# Patient Record
Sex: Male | Born: 1950 | Race: White | Hispanic: No | Marital: Married | Smoking: Former smoker
Health system: Southern US, Community
[De-identification: ages and names within clinical notes are randomized; demographics above are authoritative.]

## PROBLEM LIST (undated history)

## (undated) DIAGNOSIS — I482 Chronic atrial fibrillation, unspecified: Secondary | ICD-10-CM

## (undated) DIAGNOSIS — N400 Enlarged prostate without lower urinary tract symptoms: Secondary | ICD-10-CM

## (undated) DIAGNOSIS — G4730 Sleep apnea, unspecified: Secondary | ICD-10-CM

## (undated) DIAGNOSIS — I4891 Unspecified atrial fibrillation: Secondary | ICD-10-CM

## (undated) DIAGNOSIS — K649 Unspecified hemorrhoids: Secondary | ICD-10-CM

## (undated) DIAGNOSIS — M1990 Unspecified osteoarthritis, unspecified site: Secondary | ICD-10-CM

## (undated) DIAGNOSIS — I499 Cardiac arrhythmia, unspecified: Secondary | ICD-10-CM

## (undated) DIAGNOSIS — K8020 Calculus of gallbladder without cholecystitis without obstruction: Secondary | ICD-10-CM

## (undated) DIAGNOSIS — K635 Polyp of colon: Secondary | ICD-10-CM

## (undated) DIAGNOSIS — D126 Benign neoplasm of colon, unspecified: Secondary | ICD-10-CM

## (undated) DIAGNOSIS — I10 Essential (primary) hypertension: Secondary | ICD-10-CM

## (undated) DIAGNOSIS — Q249 Congenital malformation of heart, unspecified: Secondary | ICD-10-CM

## (undated) DIAGNOSIS — R945 Abnormal results of liver function studies: Secondary | ICD-10-CM

## (undated) DIAGNOSIS — R7989 Other specified abnormal findings of blood chemistry: Secondary | ICD-10-CM

## (undated) DIAGNOSIS — K219 Gastro-esophageal reflux disease without esophagitis: Secondary | ICD-10-CM

## (undated) DIAGNOSIS — K5790 Diverticulosis of intestine, part unspecified, without perforation or abscess without bleeding: Secondary | ICD-10-CM

## (undated) DIAGNOSIS — E785 Hyperlipidemia, unspecified: Secondary | ICD-10-CM

## (undated) HISTORY — DX: Calculus of gallbladder without cholecystitis without obstruction: K80.20

## (undated) HISTORY — DX: Diverticulosis of intestine, part unspecified, without perforation or abscess without bleeding: K57.90

## (undated) HISTORY — DX: Unspecified atrial fibrillation: I48.91

## (undated) HISTORY — DX: Polyp of colon: K63.5

## (undated) HISTORY — DX: Benign neoplasm of colon, unspecified: D12.6

## (undated) HISTORY — PX: TONSILLECTOMY: SUR1361

## (undated) HISTORY — DX: Benign prostatic hyperplasia without lower urinary tract symptoms: N40.0

## (undated) HISTORY — DX: Hyperlipidemia, unspecified: E78.5

## (undated) HISTORY — DX: Unspecified hemorrhoids: K64.9

## (undated) HISTORY — DX: Congenital malformation of heart, unspecified: Q24.9

## (undated) HISTORY — DX: Essential (primary) hypertension: I10

## (undated) HISTORY — DX: Chronic atrial fibrillation, unspecified: I48.20

## (undated) HISTORY — DX: Other specified abnormal findings of blood chemistry: R79.89

## (undated) HISTORY — DX: Morbid (severe) obesity due to excess calories: E66.01

## (undated) HISTORY — DX: Abnormal results of liver function studies: R94.5

---

## 2002-10-14 ENCOUNTER — Encounter: Payer: Self-pay | Admitting: Emergency Medicine

## 2002-10-14 ENCOUNTER — Inpatient Hospital Stay (HOSPITAL_COMMUNITY): Admission: EM | Admit: 2002-10-14 | Discharge: 2002-10-15 | Payer: Self-pay | Admitting: Emergency Medicine

## 2002-10-14 ENCOUNTER — Encounter: Payer: Self-pay | Admitting: Internal Medicine

## 2002-12-24 ENCOUNTER — Ambulatory Visit (HOSPITAL_BASED_OUTPATIENT_CLINIC_OR_DEPARTMENT_OTHER): Admission: RE | Admit: 2002-12-24 | Discharge: 2002-12-24 | Payer: Self-pay | Admitting: Otolaryngology

## 2003-10-10 ENCOUNTER — Encounter: Payer: Self-pay | Admitting: Internal Medicine

## 2005-01-17 ENCOUNTER — Encounter: Admission: RE | Admit: 2005-01-17 | Discharge: 2005-01-17 | Payer: Self-pay | Admitting: Otolaryngology

## 2006-08-08 ENCOUNTER — Encounter: Payer: Self-pay | Admitting: Cardiology

## 2006-08-08 HISTORY — PX: US ECHOCARDIOGRAPHY: HXRAD669

## 2007-02-27 ENCOUNTER — Ambulatory Visit: Payer: Self-pay | Admitting: Internal Medicine

## 2007-04-12 ENCOUNTER — Ambulatory Visit: Payer: Self-pay | Admitting: Internal Medicine

## 2007-04-12 ENCOUNTER — Encounter: Payer: Self-pay | Admitting: Internal Medicine

## 2008-02-02 ENCOUNTER — Encounter: Admission: RE | Admit: 2008-02-02 | Discharge: 2008-02-02 | Payer: Self-pay | Admitting: Internal Medicine

## 2008-02-02 ENCOUNTER — Encounter (INDEPENDENT_AMBULATORY_CARE_PROVIDER_SITE_OTHER): Payer: Self-pay | Admitting: *Deleted

## 2008-03-07 ENCOUNTER — Encounter: Payer: Self-pay | Admitting: Cardiology

## 2010-05-04 ENCOUNTER — Encounter: Payer: Self-pay | Admitting: Internal Medicine

## 2010-08-10 ENCOUNTER — Encounter (INDEPENDENT_AMBULATORY_CARE_PROVIDER_SITE_OTHER): Payer: Self-pay | Admitting: *Deleted

## 2010-09-22 ENCOUNTER — Encounter: Payer: Self-pay | Admitting: Internal Medicine

## 2010-09-28 ENCOUNTER — Ambulatory Visit: Payer: Self-pay | Admitting: Internal Medicine

## 2010-09-28 DIAGNOSIS — Z860100 Personal history of colon polyps, unspecified: Secondary | ICD-10-CM | POA: Insufficient documentation

## 2010-09-28 DIAGNOSIS — I482 Chronic atrial fibrillation, unspecified: Secondary | ICD-10-CM | POA: Insufficient documentation

## 2010-09-28 DIAGNOSIS — D689 Coagulation defect, unspecified: Secondary | ICD-10-CM

## 2010-10-08 ENCOUNTER — Encounter: Payer: Self-pay | Admitting: Internal Medicine

## 2010-10-24 ENCOUNTER — Encounter: Payer: Self-pay | Admitting: Internal Medicine

## 2010-11-02 NOTE — Letter (Signed)
Summary: New Patient letter  [facility]  [address] [place], [place] [address]   Phone: [phone]  Fax: [phone]       08/10/2010 MRN: [id number]  Dan [name] [address] [place], [place]  [address]  Dear Mr. Holst,  Welcome to the Gastroenterology Division at [facility].    You are scheduled to see Dr. Marina Goodell on 09/28/2010 at 11:00AM on the 3rd floor at [facility], [address]  We ask that you try to arrive at our office 15 minutes prior to your appointment time to allow for check-in.  We would like you to complete the enclosed self-administered evaluation form prior to your visit and bring it with you on the day of your appointment.  We will review it with you.  Also, please bring a complete list of all your medications or, if you prefer, bring the medication bottles and we will list them.  Please bring your insurance card so that we may make a copy of it.  If your insurance requires a referral to see a specialist, please bring your referral form from your primary care physician.  Co-payments are due at the time of your visit and may be paid by cash, check or credit card.     Your office visit will consist of a consult with your physician (includes a physical exam), any laboratory testing he/she may order, scheduling of any necessary diagnostic testing (e.g. x-ray, ultrasound, CT-scan), and scheduling of a procedure (e.g. Endoscopy, Colonoscopy) if required.  Please allow enough time on your schedule to allow for any/all of these possibilities.    If you cannot keep your appointment, please call [phone] to cancel or reschedule prior to your appointment date.  This allows [place] the opportunity to schedule an appointment for another patient in need of care.  If you do not cancel or reschedule by 5 p.m. the business day prior to your appointment date, you will be charged a $50.00 late cancellation/no-show fee.    Thank you for choosing  [facility] for your medical needs.  We appreciate the opportunity to care for you.  Please visit [place] at our website  to learn more about our practice.                     Sincerely,                                                             The Gastroenterology Division

## 2010-11-02 NOTE — Letter (Signed)
Summary: Colonoscopy Letter  [facility]  [place], [place] [address]   Phone: [phone]  Fax: [phone]      May 04, 2010 MRN: [id number]   Dan [name] [address] [place], [place]  [address]   Dear [name],   According to your medical record, it is time for you to schedule a Colonoscopy. The American Cancer Society recommends this procedure as a method to detect early colon cancer. Patients with a family history of colon cancer, or a personal history of colon polyps or inflammatory bowel disease are at increased risk.  This letter has been generated based on the recommendations made at the time of your procedure. If you feel that in your particular situation this may no longer apply, please contact our office.  Please call our office at [phone] to schedule this appointment or to update your records at your earliest convenience.  Thank you for cooperating with [place] to provide you with the very best care possible.   Sincerely,  [name], M.D.  [facility]astroenter[facility] Division [phone]

## 2010-11-04 NOTE — Procedures (Signed)
Summary: Colonoscopy   Colonoscopy  Procedure date:  10/10/2003  Findings:      Location:  [facility].  Results: Diverticulosis.       Pathology:  Adenomatous polyp.        Pathology:  Hyperplastic polyp.       Procedures Next Due Date:    Colonoscopy: 10/2008  Colonoscopy  Procedure date:  10/10/2003  Findings:      Location:  [facility].  Results: Diverticulosis.       Pathology:  tubular adenoma.       Procedures Next Due Date:    Colonoscopy: 10/2008 Patient Name: Dan [name], Dan [name] MRN:  Procedure Procedures: Colonoscopy CPT: 29562.    with polypectomy. CPT: X5071110.  Personnel: Endoscopist: [name], MD.  Referred By: Nanine Means Reynaldo Minium, MD.  Exam Location: Exam performed in Outpatient Clinic. Outpatient  Patient Consent: Procedure, Alternatives, Risks and Benefits discussed, consent obtained, from patient. Consent was obtained by the RN.  Indications  Increased Risk Screening: For family history of colorectal neoplasia, in  parent age at onset: 57.  History  Current Medications: Patient is not currently taking Coumadin.  Pre-Exam Physical: Performed Oct 10, 2003. Entire physical exam was normal. Abnormal PE findings include: obese.  Exam Exam: Extent of exam reached: Cecum, extent intended: Cecum.  The cecum was identified by appendiceal orifice and IC valve. Patient position: on left side. Colon retroflexion performed. Images taken. ASA Classification: II. Tolerance: excellent.  Monitoring: Pulse and BP monitoring, Oximetry used. Supplemental O2 given.  Colon Prep Used Golytely for colon prep. Prep results: excellent.  Sedation Meds: Patient assessed and found to be appropriate for moderate (conscious) sedation. Fentanyl 125 mcg. given IV. Versed 12 given IV.  Findings POLYP: Ascending Colon, Maximum size: 5 mm. sessile polyp. Procedure:  snare without cautery, removed, retrieved, Polyp sent to  pathology. ICD9: Colon Polyps: 211.3.  NORMAL EXAM: Cecum to Rectum. Comments: internal hemorrhoids present.  - DIVERTICULOSIS: Descending Colon to Sigmoid Colon. ICD9: Diverticulosis, Colon: 562.10.   Assessment Abnormal examination, see findings above.  Diagnoses: 562.10: Diverticulosis, Colon.  211.3: Colon Polyps.   Events  Unplanned Interventions: No intervention was required.  Unplanned Events: There were no complications. Plans Patient Education: Patient given standard instructions for: Polyps.  Disposition: After procedure patient sent to recovery. After recovery patient sent home.  Scheduling/Referral: Colonoscopy, to [name], MD, in 3 years if polyp adenomatous; otherwise 5 years.,    This report was created from the original endoscopy report, which was reviewed and signed by the above listed endoscopist.   cc:  Burnard Bunting, MD      The Patient

## 2010-11-04 NOTE — Letter (Signed)
Summary: Anticoagulation Modification Letter  [facility]  [address] [place], [place] [address]   Phone: [phone]  Fax: [phone]    September 28, 2010  Re:    Dan [name] DOB:    02-14-1951 MRN:    [id number]    Dear Dr. Swaziland,  We have scheduled the above patient for an endoscopic procedure. Our records show that  he/she is on anticoagulation therapy. Please advise as to how long the patient may come off their therapy of Pradaxa prior to the scheduled procedure(s) on 11/08/10.   Please fax back/or route the completed form to [place] at [phone]..  Thank you for your help with this matter.  Sincerely,  Christie Nottingham CMA Duncan Dull)   Physician Recommendation:  Hold Plavix 7 days prior ________________  Hold Coumadin 5 days prior ____________  Hold Pradaxa 5 days prior ______________

## 2010-11-04 NOTE — Letter (Signed)
Summary: Anticoag  Anticoag   Imported By: Bubba Hales 10/18/2010 10:18:06  _____________________________________________________________________  External Attachment:    Type:   Image     Comment:   External Document

## 2010-11-04 NOTE — Letter (Signed)
Summary: [facility] Instructions  [facility]  [address] [place], [place] [address]   Phone: [phone]  Fax: [phone]       Dan [name]    04/16/1951    MRN: [id number]        Procedure Day /Date: Monday February 6th, 2012     Arrival Time: 7:30am     Procedure Time: 8:30am     Location of Procedure:                    _x _  [facility] (4th Floor)                        PREPARATION FOR COLONOSCOPY WITH MOVIPREP   Starting 5 days prior to your procedure 21/12 do not eat nuts, seeds, popcorn, corn, beans, peas,  salads, or any raw vegetables.  Do not take any fiber supplements (e.g. Metamucil, Citrucel, and Benefiber).  THE DAY BEFORE YOUR PROCEDURE         DATE: 11/07/10  DAY: Sunday  1.  Drink clear liquids the entire day-NO SOLID FOOD  2.  Do not drink anything colored red or purple.  Avoid juices with pulp.  No orange juice.  3.  Drink at least 64 oz. (8 glasses) of fluid/clear liquids during the day to prevent dehydration and help the prep work efficiently.  CLEAR LIQUIDS INCLUDE: Water Jello Ice Popsicles Tea (sugar ok, no milk/cream) Powdered fruit flavored drinks Coffee (sugar ok, no milk/cream) Gatorade Juice: apple, white grape, white cranberry  Lemonade Clear bullion, consomm, broth Carbonated beverages (any kind) Strained chicken noodle soup Hard Candy                             4.  In the morning, mix first dose of MoviPrep solution:    Empty 1 Pouch A and 1 Pouch B into the disposable container    Add lukewarm drinking water to the top line of the container. Mix to dissolve    Refrigerate (mixed solution should be used within 24 hrs)  5.  Begin drinking the prep at 5:00 p.m. The MoviPrep container is divided by 4 marks.   Every 15 minutes drink the solution down to the next mark (approximately 8 oz) until the full liter is complete.   6.  Follow completed prep with 16 oz of clear liquid of your choice  (Nothing red or purple).  Continue to drink clear liquids until bedtime.  7.  Before going to bed, mix second dose of MoviPrep solution:    Empty 1 Pouch A and 1 Pouch B into the disposable container    Add lukewarm drinking water to the top line of the container. Mix to dissolve    Refrigerate  THE DAY OF YOUR PROCEDURE      DATE: 11/08/10 DAY Monday  Beginning at 3:30 a.m. (5 hours before procedure):         1. Every 15 minutes, drink the solution down to the next mark (approx 8 oz) until the full liter is complete.  2. Follow completed prep with 16 oz. of clear liquid of your choice.    3. You may drink clear liquids until 6:30am (2 HOURS BEFORE PROCEDURE).   MEDICATION INSTRUCTIONS  Unless otherwise instructed, you should take regular prescription medications with a small sip of water   as early as possible the morning of your  procedure.   Additional medication instructions:  You will stop taking Pradaxa 5 days prior to your procedure after we  get approval from Dr. Peter Swaziland. We will contact you when we get clearance from him.         OTHER INSTRUCTIONS  You will need a responsible adult at least 62 years of age to accompany you and drive you home.   This person must remain in the waiting room during your procedure.  Wear loose fitting clothing that is easily removed.  Leave jewelry and other valuables at home.  However, you may wish to bring a book to read or  an iPod/MP3 player to listen to music as you wait for your procedure to start.  Remove all body piercing jewelry and leave at home.  Total time from sign-in until discharge is approximately 2-3 hours.  You should go home directly after your procedure and rest.  You can resume normal activities the  day after your procedure.  The day of your procedure you should not:   Drive   Make legal decisions   Operate machinery   Drink alcohol   Return to work  You will receive specific instructions  about eating, activities and medications before you leave.    The above instructions have been reviewed and explained to me by   _______________________    I fully understand and can verbalize these instructions _____________________________ Date _________

## 2010-11-04 NOTE — Miscellaneous (Signed)
Summary: medication udate  Clinical Lists Changes  Medications: Added new medication of COUMADIN 5 MG TABS (WARFARIN SODIUM) take as directed Added new medication of HYDROCHLOROTHIAZIDE 25 MG TABS (HYDROCHLOROTHIAZIDE) 1 by mouth once daily Added new medication of NORVASC 10 MG TABS (AMLODIPINE BESYLATE) 1 by mouth once daily Added new medication of BENICAR 20 MG TABS (OLMESARTAN MEDOXOMIL) 1 by mouth once daily Added new medication of LISINOPRIL 40 MG TABS (LISINOPRIL) 1 by mouth once daily Added new medication of METOPROLOL TARTRATE 100 MG TABS (METOPROLOL TARTRATE) 1 by mouth once daily Added new medication of VYTORIN 10-20 MG TABS (EZETIMIBE-SIMVASTATIN) 1 by mouth once daily

## 2010-11-04 NOTE — Assessment & Plan Note (Signed)
Summary: Surveillance colonoscopy on Pradaxa    History of Present Illness Visit Type: Initial Visit Primary GI MD: Scarlette Shorts MD Primary Marissa Weaver: Burnard Bunting, MD Chief Complaint: colon screening, patient taking Pradaxa History of Present Illness:   60 year old white male with a history of hypertension, hyperlipidemia, obesity, and atrial fibrillation for which he is on Pradaxa therapy. She also has a history of multiple adenomatous colon polyps with his initial colonoscopy in 2005 followup colonoscopy in 2008. Also a family history of colon cancer in his mother. Since his last colonoscopy, he has been medically stable. He has switched from Coumadin to Pradaxa. He has come off Coumadin, with the approval of his cardiologist, in the past. Aside from hemorrhoid related itching, no GI complaints. He continues to see Dr. Reynaldo Minium for his general medical care and Dr. Martinique for his cardiac care.   GI Review of Systems      Denies abdominal pain, acid reflux, belching, bloating, chest pain, dysphagia with liquids, dysphagia with solids, heartburn, loss of appetite, nausea, vomiting, vomiting blood, weight loss, and  weight gain.      Reports hemorrhoids.     Denies anal fissure, black tarry stools, change in bowel habit, constipation, diarrhea, diverticulosis, fecal incontinence, heme positive stool, irritable bowel syndrome, jaundice, light color stool, liver problems, rectal bleeding, and  rectal pain. Preventive Screening-Counseling & Management  Alcohol-Tobacco     Smoking Status: quit      Drug Use:  no.      Current Medications (verified): 1)  Pradaxa 150 Mg Caps (Dabigatran Etexilate Mesylate) .... Two Times A Day 2)  Hydrochlorothiazide 25 Mg Tabs (Hydrochlorothiazide) .Marland Kitchen.. 1 By Mouth Once Daily 3)  Norvasc 10 Mg Tabs (Amlodipine Besylate) .Marland Kitchen.. 1 By Mouth Once Daily 4)  Triamterene-Hctz 37.5-25 Mg Tabs (Triamterene-Hctz) .... Once Daily 5)  Lisinopril 40 Mg Tabs (Lisinopril)  .Marland Kitchen.. 1 By Mouth Once Daily 6)  Metoprolol Tartrate 100 Mg Tabs (Metoprolol Tartrate) .Marland Kitchen.. 1 By Mouth Once Daily 7)  Vytorin 10-20 Mg Tabs (Ezetimibe-Simvastatin) .Marland Kitchen.. 1 By Mouth Once Daily 8)  Bystolic 20 Mg Tabs (Nebivolol Hcl) .... Take 2 Tablets 49m Once Daily  Allergies (verified): No Known Drug Allergies  Past History:  Past Medical History: Atrial Fibrillation Adenomatous Colon Polyps Diverticulosis Hypertension Sleep Apnea  Past Surgical History: Reviewed history from 03/19/2008 and no changes required. Unremarkable  Family History: Family History of Colon Cancer:Mother Family History of Colon Polyps:Mother  Social History: Occupation: [employer] Patient is a former smoker.  Alcohol Use - yes Daily Caffeine Use Illicit Drug Use - no Smoking Status:  quit Drug Use:  no  Review of Systems  The patient denies allergy/sinus, anemia, anxiety-new, arthritis/joint pain, back pain, blood in urine, breast changes/lumps, change in vision, confusion, cough, coughing up blood, depression-new, fainting, fatigue, fever, headaches-new, hearing problems, heart murmur, heart rhythm changes, itching, menstrual pain, muscle pains/cramps, night sweats, nosebleeds, pregnancy symptoms, shortness of breath, skin rash, sleeping problems, sore throat, swelling of feet/legs, swollen lymph glands, thirst - excessive , urination - excessive , urination changes/pain, urine leakage, vision changes, and voice change.    Vital Signs:  Patient profile:   60year old male Height:      73 inches Weight:      282.25 pounds BMI:     37.37 Pulse rate:   100 / minute Pulse rhythm:   regular BP sitting:   130 / 88  (left arm) Cuff size:   large  Vitals Entered By: June McMurray  CMA Deborra Medina) (September 28, 2010 10:57 AM)  Physical Exam  General:  Well developed, obese,well nourished, no acute distress. Head:  Normocephalic and atraumatic. Eyes:  PERRLA, no icterus. Nose:  No deformity, discharge,   or lesions. Mouth:  No deformity or lesions, dentition normal. Neck:  Supple; no masses or thyromegaly. Thick. Lungs:  Clear throughout to auscultation. Heart:  Regular rate and rhythm; no murmurs, rubs,  or bruits. Abdomen:  Soft, nontender and nondistended. No masses, hepatosplenomegaly or hernias noted. Normal bowel sounds. Rectal:  deferred until colonoscopy Msk:  Symmetrical with no gross deformities. Normal posture. Pulses:  Normal pulses noted. Extremities:  No clubbing, cyanosis, edema or deformities noted. Neurologic:  Alert and  oriented x4. Skin:  Intact without significant lesions or rashes. Psych:  Alert and cooperative. Normal mood and affect.   Impression & Recommendations:  Problem # 1:  PERSONAL HX COLONIC POLYPS (ICD-V12.72) history of adenomatous colon polyps. Due for surveillance colonoscopy. High-Risk due to multiple medical problems and the need to address anticoagulation therapy.  Plan: #1. Colonoscopy. The nature of the procedure as well as the risks, and if it's, and alternatives were reviewed. He understood and agreed to proceed #2. Movi prep prescribed. The patient instructed on its use #3. Hold Pradaxa 5 days prior to colonoscopy if okay with Dr. Martinique. We will send him a letter for his review regarding this issue  Problem # 2:  FAMILY HX COLON CANCER (ICD-V16.0) parent  Problem # 3:  ATRIAL FIBRILLATION (ICD-427.31) Assessment: Comment Only  Problem # 4:  OTHER AND UNSPECIFIED COAGULATION DEFECTS (ICD-286.9) needs to be addressed preprocedure as discussed in #1 above  Other Orders: Colonoscopy (Colon)  Patient Instructions: 1)  Pick up your prep from your pharmacy.  2)  We will obtain clearance from Dr. Martinique regarding your Pradaxa clearance and we will contact you. 3)  Colonoscopy and Flexible Sigmoidoscopy brochure given.  4)  Copy sent to : Peter Martinique, MD 5)                         Burnard Bunting, MD 6)  The medication list was reviewed  and reconciled.  All changed / newly prescribed medications were explained.  A complete medication list was provided to the patient / caregiver. Prescriptions: MOVIPREP 100 GM  SOLR (PEG-KCL-NACL-NASULF-NA ASC-C) As per prep instructions.  #1 x 0   Entered by:   Marlon Pel CMA ([place])   Authorized by:   Irene Shipper MD   Signed by:   Marlon Pel CMA ([place]) on 09/28/2010   Method used:   Electronically to        [facility] [phone]* (retail)       [address]       [place], [place]  [address]       Ph: [phone]       Fax: [phone]   RxID:   [id number]

## 2010-11-18 ENCOUNTER — Other Ambulatory Visit (AMBULATORY_SURGERY_CENTER): Payer: BC Managed Care – PPO | Admitting: Internal Medicine

## 2010-11-18 ENCOUNTER — Other Ambulatory Visit: Payer: Self-pay | Admitting: Internal Medicine

## 2010-11-18 DIAGNOSIS — Z1211 Encounter for screening for malignant neoplasm of colon: Secondary | ICD-10-CM

## 2010-11-18 DIAGNOSIS — Z86010 Personal history of colonic polyps: Secondary | ICD-10-CM

## 2010-11-18 DIAGNOSIS — K5730 Diverticulosis of large intestine without perforation or abscess without bleeding: Secondary | ICD-10-CM

## 2010-11-18 DIAGNOSIS — D126 Benign neoplasm of colon, unspecified: Secondary | ICD-10-CM

## 2010-11-23 ENCOUNTER — Encounter: Payer: Self-pay | Admitting: Internal Medicine

## 2010-11-24 NOTE — Procedures (Addendum)
Summary: Colonoscopy  Patient: Dan [name] Note: All result statuses are Final unless otherwise noted.  Tests: (1) Colonoscopy (COL)   COL Colonoscopy           [facility]     [place], [place]  [id number]           COLONOSCOPY PROCEDURE REPORT           PATIENT:  Dan [name], Dan [name]  MR#:  [id number]     BIRTHDATE:  [birth year], 66 yrs. old  GENDER:  male     ENDOSCOPIST:  Docia Chuck. Geri Seminole, MD     REF. BY:  Surveillance Program Recall,     PROCEDURE DATE:  11/18/2010     PROCEDURE:  Colonoscopy with snare polypectomy x 2     ASA CLASS:  Class II     INDICATIONS:  history of pre-cancerous (adenomatous) colon polyps,     surveillance and high-risk screening, family history of colon     cancer ; multiple adenomas 2005 and 2008; mom w/ hx CRC     MEDICATIONS:   Fentanyl 50 mcg IV, Versed 5 mg IV           DESCRIPTION OF PROCEDURE:   After the risks benefits and     alternatives of the procedure were thoroughly explained, informed     consent was obtained.  Digital rectal exam was performed and     revealed no abnormalities.   The LB CF-[id number] endoscope     was introduced through the anus and advanced to the cecum, which     was identified by both the appendix and ileocecal valve, without     limitations.Time to cecum = 2:18 min.  The quality of the prep was     excellent, using MoviPrep.  The instrument was then slowly     withdrawn (time = 17:04 min) as the colon was fully examined.     <<PROCEDUREIMAGES>>           FINDINGS:  Two 105m polyps were found in the cecum and transverse     colon respectively. Polyps were snared without cautery. Retrieval     was successful.  Moderate diverticulosis was found in the left     colon.  Otherwise normal colonoscopy without other polyps, masses,     vascular ectasias, or inflammatory changes.   Retroflexed views in     the rectum revealed no abnormalities.    The scope was then     withdrawn  from the patient and the procedure completed.           COMPLICATIONS:  None           ENDOSCOPIC IMPRESSION:     1) Two polyps - removed     2) Moderate diverticulosis in the left colon     3) Otherwise nl colonoscopy     RECOMMENDATIONS:     1) Follow up colonoscopy in 5 years     ______________________________     JDocia Chuck PGeri Seminole MD           CC:  RBurnard Bunting MD; The Patient           n.     eSIGNED:   JDocia Chuck PGeri Seminoleat 11/18/2010 09:05 AM           SScheryl Darter [id number] Note: An exclamation mark (!) indicates a result that was  not dispersed into the flowsheet. Document Creation Date: 11/18/2010 9:06 AM _______________________________________________________________________  (1) Order result status: Final Collection or observation date-time: 11/18/2010 08:59 Requested date-time:  Receipt date-time:  Reported date-time:  Referring Physician:   Ordering Physician: Lavena Bullion [phone]) Specimen Source:  Source: Tawanna Cooler Order Number: [id number] Lab site:   Appended Document: Colonoscopy recall 5 yrs     Procedures Next Due Date:    Colonoscopy: 11/2015  Appended Document: Colonoscopy     Procedures Next Due Date:    Colonoscopy: 12/2015

## 2010-11-30 NOTE — Letter (Addendum)
Summary: Patient Notice- Polyp Results  Dan [name]  [address] [place], [place] [address]   Phone: [phone]  Fax: [phone]        November 23, 2010 MRN: [id number]    Dan [name] [address] [place], [place]  [address]    Dear [name],  I am pleased to inform you that the colon polyp(s) removed during your recent colonoscopy was (were) found to be benign (no cancer detected) upon pathologic examination.  I recommend you have a repeat colonoscopy examination in 5 years to look for recurrent polyps, as having colon polyps increases your risk for having recurrent polyps or even colon cancer in the future.  Should you develop new or worsening symptoms of abdominal pain, bowel habit changes or bleeding from the rectum or bowels, please schedule an evaluation with either your primary care physician or with me.    Additional information/recommendations:  __ No further action with [name] is needed at this time. Please      follow-up with your primary care physician for your other healthcare      needs.  Please call us if you are having persistent problems or have questions about your condition that have not been fully answered at this time.  Sincerely,  Irene Shipper MD  This letter has been electronically signed by your physician.  Appended Document: Patient Notice- Polyp Results letter mailed

## 2011-02-12 ENCOUNTER — Other Ambulatory Visit: Payer: Self-pay | Admitting: Cardiology

## 2011-02-14 NOTE — Telephone Encounter (Signed)
escribe medication per fax request  

## 2011-02-15 NOTE — Assessment & Plan Note (Signed)
Dan [name] OFFICE NOTE   Dan [name]                   MRN:          [id number]  DATE:02/27/2007                            DOB:          [birth year]    REASON FOR EVALUATION:  Surveillance colonoscopy.   HISTORY:  This is a 60 year old white male with a history of  hypertension and recently diagnosed atrial fibrillation, for which he is  on Coumadin.  Patient also has a history of adenomatous colon polyp and  a family history of colon cancer in his mother.  His index colonoscopy  was performed in January, 2005.  He was found to have a sessile proximal  colon polyp, which was removed and found to be an adenoma.  Also, left-  sided diverticulosis.  Followup in three years recommended.  Since that  time, he has been doing well.  About three months ago, he was  incidentally found to have atrial fibrillation.  He was placed on  Coumadin therapy as well as metoprolol.  Patient denies abdominal pain,  change in bowel habits, or rectal bleeding.  No other significant  interval medical problems.   PAST MEDICAL HISTORY:  As above.   PAST SURGICAL HISTORY:  None.   ALLERGIES:  No known drug allergies.   CURRENT MEDICATIONS:  1. Vytorin 10/70 mg daily.  2. Metoprolol 100 mg daily.  3. Lisinopril 40 mg daily.  4. Benicar 20 mg daily.  5. Norvasc 10 mg daily.  6. Hydrochlorothiazide 25 mg daily.  7. Coumadin 5 mg daily, Sunday through Friday.   FAMILY HISTORY:  Mother with colon cancer.   SOCIAL HISTORY:  Patient is married with two children.  Works as a Teacher, music at [employer].  He does not smoke.  He  does use alcohol.   REVIEW OF SYSTEMS:  Per diagnostic evaluation form.   PHYSICAL EXAMINATION:  GENERAL:  A well-appearing male in no acute  distress.  He is alert and oriented.  VITAL SIGNS:  Blood pressure is 138/88, heart rate is 96 and irregularly  irregular.  HEENT:  Sclerae are anicteric.  Conjunctivae are pink.  LUNGS:  Clear.  HEART:  Irregularly irregular.  ABDOMEN:  Soft without tenderness, mass, or hernia.  EXTREMITIES:  Without edema.   IMPRESSION:  This is a 60 year old gentleman with a personal history of  adenomatous colon polyps and a family history of colon cancer in a first-  degree relative.  He is due for surveillance colonoscopy.  The nature of  the procedure, as well as the risks, benefits and alternatives have been  reviewed.  He understood and agreed to proceed.  We also discussed the  issues surrounding his Coumadin therapy.  Patient states to me that his  cardiologist, Dr. Martinique, said that it would be okay to come off  Coumadin for a short period of time for his colonoscopy.  If this is  acceptable, we would like to hold the drug for about three days prior to  the exam.  We will confer with Dr. Martinique on this issue to confirm  acceptabiltiy.  [name], MD  Electronically Signed    JNP/MedQ  DD: 02/27/2007  DT: 02/28/2007  Job #: [id number]   cc:   Burnard Bunting, M.D.  Peter M. Martinique, M.D.

## 2011-02-18 NOTE — H&P (Signed)
NAMEBRALLAN, DENIO NO.:  [id number]   MEDICAL RECORD NO.:  [id number]                   PATIENT TYPE:  EMS   LOCATION:  ED                                   FACILITY:  [facility]   PHYSICIAN:  Burnard Bunting, M.D.               DATE OF BIRTH:  January 08, 1951   DATE OF ADMISSION:  10/13/2002  DATE OF DISCHARGE:                                HISTORY & PHYSICAL   CHIEF COMPLAINT:  Diarrhea, lightheadedness.   HISTORY OF PRESENT ILLNESS:  The patient is a very pleasant 60 year old  gentleman with known essential hypertension and a recent bout of sinusitis,  presenting at this time with diarrhea, lightheadedness, and swelling about  the lips.  Much of the history is obtained from the wife as the patient is  pretty drowsy and falls asleep very easily following some alcohol with the  ball games and some Benadryl as a result of his reaction in the emergency  room.  Wife relates that he has had a pretty fairly normal weekend.  For the  last two weeks he has had some difficulty with sinus symptoms and dyspnea  but has been relatively normal with no fever and had normal days.  The  weekend was pretty unremarkable;  he has been watching ball games all day  and drinking a combination of scotch and wine.  Evidently, upon questioning  him some time preceding the event he, as well, had candy with macadamia nuts  and as a side note happened to mention that he is allergic to [place].  The wife had gone to bed with son and awoke to him calling for her  from the [facility], where she found him to have difficulty walking, lightheaded.  She assisted him to bed and summoned EMS.  He was brought to the emergency  room where he was found to be hypotensive with blood pressure approximately  63-80/40-50.  He was also noted to have developed some angioedema about the  face some time during the late evening hours, timing not entirely clear.  He  has had no nausea or vomiting.   Denies any fevers.  Has had no cough.  Denies any chest pain.  In the emergency room he was treated with steroids,  dopamine, and Benadryl and is admitted at this time for stabilization.   ALLERGIES:  No known drug allergies.  The only allergy is known to be  [place].   MEDICATIONS:  1. Norvasc 5 mg daily.  2. Lisinopril 40 mg daily.  3. HCTZ 25 daily.  4. Vioxx 25 daily p.r.n.  5. Augmentin XR 1000 daily.  6. Flonase.   PAST MEDICAL HISTORY:  Essential hypertension.   PAST SURGICAL HISTORY:  None.   FAMILY HISTORY:  Noncontributory.   SOCIAL HISTORY:  The patient is married, has one son.  Does not smoke.  Drinks socially.  This evening alcohol intake not entirely  clear but has had  a fair amount of scotch and wine, to the extent that alcohol was clearly  able to be smelled across the room.   PHYSICAL EXAMINATION:  VITAL SIGNS:  Temperature 95, blood pressure systolic  051 at this time, pulse 100 and regular, respiratory rate 18.  GENERAL:  Patient currently snoring.  SKIN:  No urticaria.  HEENT:  Anicteric.  Good facial symmetry.  Significant periorbital and  perioral angioedema.  Oropharynx benign.  Uvula midline, nonobstructing.  No  stridor.  NECK:  No JVD or bruits.  LUNGS:  Remarkable for some coarse upper airway sounds.  No wheezing, rales,  or rhonchi.  CARDIOVASCULAR:  Regular rate and rhythm.  No murmur, gallop, rub, or heave.  ABDOMEN:  Soft and nontender.  No hepatosplenomegaly.  EXTREMITIES:  No cyanosis, clubbing, or edema.  Pulses 2+, warm distally.  GENITOURINARY:  Not done.  RECTAL:  Not done.  NEUROLOGIC:  Difficult as the patient is quite drowsy, felt to be a  combination of alcohol and Benadryl.  He will arouse, follow simple  commands, nonlateralizing.   LABORATORY DATA:  Chest x-ray:  Poor inspiratory effort.   EKG:  Normal.   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  No frank evidence  of sepsis and certainly no  evidence of cardiac event at this time.   PLAN:  The patient is to be admitted for IV fluids, steroids, dopamine,  empiric coverage with antibiotic given his recent sinus infection and use of  Augmentin, and monitoring.  For details, see orders.                                               Burnard Bunting, M.D.    RA/MEDQ  D:  10/14/2002  T:  10/14/2002  Job:  [id number]

## 2011-03-20 ENCOUNTER — Other Ambulatory Visit: Payer: Self-pay | Admitting: Cardiology

## 2011-03-21 ENCOUNTER — Other Ambulatory Visit: Payer: Self-pay | Admitting: Cardiology

## 2011-03-21 NOTE — Telephone Encounter (Signed)
Med refill. Msg also sent with script that pt needs one year office visit

## 2011-06-18 ENCOUNTER — Other Ambulatory Visit: Payer: Self-pay | Admitting: Cardiology

## 2011-06-20 ENCOUNTER — Encounter: Payer: Self-pay | Admitting: Cardiology

## 2011-06-20 NOTE — Telephone Encounter (Signed)
Refilled Meds from fax  

## 2011-06-24 ENCOUNTER — Encounter: Payer: Self-pay | Admitting: Cardiology

## 2011-06-24 ENCOUNTER — Ambulatory Visit (INDEPENDENT_AMBULATORY_CARE_PROVIDER_SITE_OTHER): Payer: BC Managed Care – PPO | Admitting: Cardiology

## 2011-06-24 VITALS — BP 134/84 | HR 80 | Ht 73.0 in | Wt 287.0 lb

## 2011-06-24 DIAGNOSIS — I4891 Unspecified atrial fibrillation: Secondary | ICD-10-CM

## 2011-06-24 DIAGNOSIS — E785 Hyperlipidemia, unspecified: Secondary | ICD-10-CM | POA: Insufficient documentation

## 2011-06-24 DIAGNOSIS — E119 Type 2 diabetes mellitus without complications: Secondary | ICD-10-CM

## 2011-06-24 DIAGNOSIS — I10 Essential (primary) hypertension: Secondary | ICD-10-CM | POA: Insufficient documentation

## 2011-06-24 MED ORDER — DABIGATRAN ETEXILATE MESYLATE 150 MG PO CAPS: 150.0000 mg | ORAL_CAPSULE | Freq: Two times a day (BID) | ORAL | Status: DC

## 2011-06-24 NOTE — Progress Notes (Signed)
   Dan [name] Date of Birth: 1951-05-21   History of Present Illness Dan [name] is seen today for yearly followup. He has a history of chronic atrial fibrillation that has been managed with rate control and anticoagulation. He remains asymptomatic. He denies any chest pain, shortness of breath, or palpitations. He remains active. He is now on metformin for diabetes. He reports his blood pressure has been under excellent control.  Current Outpatient Prescriptions on File Prior to Visit  Medication Sig Dispense Refill  . amLODipine (NORVASC) 10 MG tablet Take 10 mg by mouth daily.        Marland Kitchen ezetimibe-simvastatin (VYTORIN) 10-20 MG per tablet Take 1 tablet by mouth at bedtime.        Marland Kitchen lisinopril (PRINIVIL,ZESTRIL) 40 MG tablet Take 40 mg by mouth daily.        . metFORMIN (GLUCOPHAGE) 1000 MG tablet Take 1 tablet by mouth Twice daily.      . Nebivolol HCl (BYSTOLIC) 20 MG TABS Take 40 mg by mouth daily.        Marland Kitchen triamterene-hydrochlorothiazide (DYAZIDE) 37.5-25 MG per capsule Take 1 capsule by mouth every morning.        Marland Kitchen DISCONTD: PRADAXA 150 MG CAPS TAKE 1 CAPSULE BY MOUTH TWICE DAILY  60 capsule  3  . olmesartan (BENICAR) 20 MG tablet Take 20 mg by mouth daily.          No Known Allergies  Past Medical History  Diagnosis Date  . Chronic atrial fibrillation   . Hypertension   . Hyperlipidemia   . Morbid obesity   . BPH (benign prostatic hypertrophy)     Past Surgical History  Procedure Date  . US echocardiography 08/08/2006    ef 55-60%    History  Smoking status  . Former Smoker  . Quit date: 06/20/1991  Smokeless tobacco  . Not on file    History  Alcohol Use No    Family History  Problem Relation Age of Onset  . Heart attack Father     Review of Systems: As per history of present illness.  All other systems were reviewed and are negative.  Physical Exam: BP 134/84  Pulse 80  Ht 6' 1"  (1.854 m)  Wt 287 lb (130.182 kg)  BMI 37.86 kg/m2 He is an obese  white male in no distress. The patient is alert and oriented x 3.  The mood and affect are normal.  The skin is warm and dry.  Color is normal.  The HEENT exam reveals that the sclera are nonicteric.  The mucous membranes are moist.  The carotids are 2+ without bruits.  There is no thyromegaly.  There is no JVD.  The lungs are clear.  The chest wall is non tender.  The heart exam reveals a regular rate with a normal S1 and S2.  There are no murmurs, gallops, or rubs.  The PMI is not displaced.   Abdominal exam reveals good bowel sounds.  There is no guarding or rebound.  There is no hepatosplenomegaly or tenderness.  There are no masses.  Exam of the legs reveal no clubbing, cyanosis, or edema.  The legs are without rashes.  The distal pulses are intact.  Cranial nerves II - XII are intact.  Motor and sensory functions are intact.  The gait is normal.  LABORATORY DATA: ECG today demonstrates atrial fibrillation with a controlled ventricular response. It is otherwise normal.  Assessment / Plan:

## 2011-06-24 NOTE — Assessment & Plan Note (Signed)
Recent lab work checked by Dr. Reynaldo Minium. Patient reports that levels were acceptable.

## 2011-06-24 NOTE — Patient Instructions (Signed)
Continue your current medicatons.  I will see you again in 6 months.

## 2011-06-24 NOTE — Assessment & Plan Note (Signed)
Blood pressure is under excellent control on his current medical therapy.

## 2011-06-24 NOTE — Assessment & Plan Note (Signed)
His rate is well controlled and he is anticoagulated with Pradaxa. He is asymptomatic. We will continue with his current medical therapy. I will followup again in one year.

## 2011-06-25 ENCOUNTER — Encounter: Payer: Self-pay | Admitting: Cardiology

## 2011-10-26 ENCOUNTER — Other Ambulatory Visit: Payer: Self-pay | Admitting: Cardiology

## 2012-06-29 ENCOUNTER — Other Ambulatory Visit: Payer: Self-pay | Admitting: Cardiology

## 2012-06-29 MED ORDER — DABIGATRAN ETEXILATE MESYLATE 150 MG PO CAPS: 150.0000 mg | ORAL_CAPSULE | Freq: Two times a day (BID) | ORAL | Status: DC

## 2012-12-05 ENCOUNTER — Other Ambulatory Visit: Payer: Self-pay | Admitting: Cardiology

## 2013-02-05 ENCOUNTER — Telehealth: Payer: Self-pay | Admitting: Physician Assistant

## 2013-02-05 ENCOUNTER — Other Ambulatory Visit: Payer: Self-pay | Admitting: *Deleted

## 2013-02-05 MED ORDER — DABIGATRAN ETEXILATE MESYLATE 150 MG PO CAPS: 150.0000 mg | ORAL_CAPSULE | Freq: Two times a day (BID) | ORAL | Status: DC

## 2013-02-05 NOTE — Telephone Encounter (Signed)
The pharmacist, Judson Roch, with [facility] on [address] called after-hours regarding a problem getting the patient's Pradaxa refilled. She stated they have sent our office refill requests for the last 5 days but have not heard anything back. I do not have access to refill request files while on call at the hospital. The patient has been compliant with his Pradaxa 134m BID with no reported bleeding. Most recent OV was 06/2011 for chronic atrial fibrillation. The rx that he typically receives is a blister pack and unable to be separated per pharmacist. Therefore, I have authorized a short-term rx of Pradaxa 151mBID disp #30 with zero refills. The pharmacist will instruct the patient to call our office in the AM to schedule a f/u appointment and discuss obtaining further refills on his medications - he is waiting at pharmacy now. Will forward to triage to look into as well. He needs an OV in the next week. Dayna Dunn PA-C

## 2013-02-06 ENCOUNTER — Other Ambulatory Visit: Payer: Self-pay

## 2013-02-06 MED ORDER — DABIGATRAN ETEXILATE MESYLATE 150 MG PO CAPS: 150.0000 mg | ORAL_CAPSULE | Freq: Two times a day (BID) | ORAL | Status: DC

## 2013-02-06 NOTE — Telephone Encounter (Signed)
Patient called no answer.Left message to call office need to schedule appointment with Dr.Jordan.Last office visit 06/24/11.Pradaxa refilled.

## 2013-02-06 NOTE — Telephone Encounter (Signed)
Patient called appointment scheduled with Dr.Jordan 04/12/13.

## 2013-02-06 NOTE — Telephone Encounter (Signed)
Will forward to Dr. Elvis Coil nurse.

## 2013-02-06 NOTE — Addendum Note (Signed)
Addended by: Golden Hurter D on: 02/06/2013 08:46 AM   Modules accepted: Orders

## 2013-04-12 ENCOUNTER — Encounter: Payer: Self-pay | Admitting: Cardiology

## 2013-04-12 ENCOUNTER — Ambulatory Visit (INDEPENDENT_AMBULATORY_CARE_PROVIDER_SITE_OTHER): Payer: BC Managed Care – PPO | Admitting: Cardiology

## 2013-04-12 VITALS — BP 140/84 | HR 83 | Ht 73.0 in | Wt 288.1 lb

## 2013-04-12 DIAGNOSIS — I4891 Unspecified atrial fibrillation: Secondary | ICD-10-CM

## 2013-04-12 DIAGNOSIS — E785 Hyperlipidemia, unspecified: Secondary | ICD-10-CM

## 2013-04-12 DIAGNOSIS — I10 Essential (primary) hypertension: Secondary | ICD-10-CM

## 2013-04-12 MED ORDER — DABIGATRAN ETEXILATE MESYLATE 150 MG PO CAPS: 150.0000 mg | ORAL_CAPSULE | Freq: Two times a day (BID) | ORAL | Status: DC

## 2013-04-12 NOTE — Patient Instructions (Addendum)
Continue your current therapy.  We will schedule you for a nuclear stress test   I will see you in one year

## 2013-04-12 NOTE — Progress Notes (Signed)
Dan [name] Date of Birth: [birth year]   History of Present Illness Dan [name] is seen today for yearly followup. He has a history of chronic atrial fibrillation that has been managed with rate control and anticoagulation. He remains asymptomatic. He denies any chest pain, shortness of breath, or palpitations. He remains active and walks 2 miles a day. He reports that his diabetes has been under good control with the last A1c of 5.9%. He is asking about a followup stress test since his last stress Cardiolite study was about 15 years ago.  Current Outpatient Prescriptions on File Prior to Visit  Medication Sig Dispense Refill  . amLODipine (NORVASC) 10 MG tablet Take 10 mg by mouth daily.        Marland Kitchen ezetimibe-simvastatin (VYTORIN) 10-20 MG per tablet Take 1 tablet by mouth at bedtime.        Marland Kitchen lisinopril (PRINIVIL,ZESTRIL) 40 MG tablet Take 40 mg by mouth daily.        . metFORMIN (GLUCOPHAGE) 1000 MG tablet Take 1 tablet by mouth Twice daily.      . Nebivolol HCl (BYSTOLIC) 20 MG TABS Take 40 mg by mouth daily.        Marland Kitchen triamterene-hydrochlorothiazide (DYAZIDE) 37.5-25 MG per capsule Take 1 capsule by mouth every morning.         No current facility-administered medications on file prior to visit.    No Known Allergies  Past Medical History  Diagnosis Date  . Chronic atrial fibrillation   . Hypertension   . Hyperlipidemia   . Morbid obesity   . BPH (benign prostatic hypertrophy)   . Diabetes mellitus     Past Surgical History  Procedure Laterality Date  . US echocardiography  08/08/2006    ef 55-60%    History  Smoking status  . Former Smoker  . Quit date: 06/20/1991  Smokeless tobacco  . Not on file    History  Alcohol Use No    Family History  Problem Relation Age of Onset  . Heart attack Father     Review of Systems: As per history of present illness.  All other systems were reviewed and are negative.  Physical Exam: BP 140/84  Pulse 83  Ht 6' 1"   (1.854 m)  Wt 288 lb 1.9 oz (130.69 kg)  BMI 38.02 kg/m2 He is an obese white male in no distress.   The HEENT exam is normal.  The carotids are 2+ without bruits.  There is no thyromegaly.  There is no JVD.  The lungs are clear.    The heart exam reveals an irregular rate with a normal S1 and S2.  There are no murmurs, gallops, or rubs.  The PMI is not displaced.   Abdominal exam reveals good bowel sounds.    There are no masses.  Exam of the legs reveal no clubbing, cyanosis, or edema.  The distal pulses are intact.  Cranial nerves II - XII are intact.  Motor and sensory functions are intact.  The gait is normal.  LABORATORY DATA: ECG today demonstrates atrial fibrillation with a controlled ventricular response of 83 beats per minute. It is otherwise normal.  Assessment / Plan: 1. Permanent atrial fibrillation. Rate is well controlled and he is anticoagulated with Pradaxa. He is asymptomatic. We will continue with his current therapy.  2. Cardiac risk assessment. Patient does have multiple cardiac risk factors including diabetes, hypertension, and hyperlipidemia. I think it would be reasonable to perform stress testing in this is  in accordance with ADA and ACC guidelines. We will schedule him for a stress Myoview.  3. Hypertension-controlled  4. Diabetes mellitus type 2-controlled.

## 2013-04-23 ENCOUNTER — Encounter (HOSPITAL_COMMUNITY): Payer: BC Managed Care – PPO

## 2013-05-01 ENCOUNTER — Ambulatory Visit (HOSPITAL_COMMUNITY): Payer: 59 | Attending: Cardiovascular Disease | Admitting: Radiology

## 2013-05-01 DIAGNOSIS — I10 Essential (primary) hypertension: Secondary | ICD-10-CM

## 2013-05-01 DIAGNOSIS — R0989 Other specified symptoms and signs involving the circulatory and respiratory systems: Secondary | ICD-10-CM

## 2013-05-01 DIAGNOSIS — E785 Hyperlipidemia, unspecified: Secondary | ICD-10-CM

## 2013-05-01 DIAGNOSIS — I4891 Unspecified atrial fibrillation: Secondary | ICD-10-CM

## 2013-05-01 MED ORDER — TECHNETIUM TC 99M SESTAMIBI GENERIC - CARDIOLITE
30.0000 | Freq: Once | INTRAVENOUS | Status: AC | PRN
  Administered 2013-05-01: 30 via INTRAVENOUS

## 2013-05-07 ENCOUNTER — Ambulatory Visit (HOSPITAL_COMMUNITY): Payer: 59 | Attending: Cardiology | Admitting: Radiology

## 2013-05-07 ENCOUNTER — Other Ambulatory Visit: Payer: Self-pay | Admitting: *Deleted

## 2013-05-07 VITALS — BP 125/89 | Ht 73.0 in | Wt 286.0 lb

## 2013-05-07 DIAGNOSIS — Z8249 Family history of ischemic heart disease and other diseases of the circulatory system: Secondary | ICD-10-CM | POA: Insufficient documentation

## 2013-05-07 DIAGNOSIS — I10 Essential (primary) hypertension: Secondary | ICD-10-CM | POA: Insufficient documentation

## 2013-05-07 DIAGNOSIS — R002 Palpitations: Secondary | ICD-10-CM | POA: Insufficient documentation

## 2013-05-07 DIAGNOSIS — Z87891 Personal history of nicotine dependence: Secondary | ICD-10-CM | POA: Insufficient documentation

## 2013-05-07 DIAGNOSIS — I4891 Unspecified atrial fibrillation: Secondary | ICD-10-CM

## 2013-05-07 DIAGNOSIS — E119 Type 2 diabetes mellitus without complications: Secondary | ICD-10-CM | POA: Insufficient documentation

## 2013-05-07 MED ORDER — DABIGATRAN ETEXILATE MESYLATE 150 MG PO CAPS: 150.0000 mg | ORAL_CAPSULE | Freq: Two times a day (BID) | ORAL | Status: DC

## 2013-05-07 MED ORDER — TECHNETIUM TC 99M SESTAMIBI GENERIC - CARDIOLITE
30.0000 | Freq: Once | INTRAVENOUS | Status: AC | PRN
  Administered 2013-05-07: 30 via INTRAVENOUS

## 2013-05-07 NOTE — Progress Notes (Signed)
[facility] [address] [place], [place] [address] [phone]    Cardiology Nuclear Med Study  Dan [name] is a 62 y.o. male     MRN : [id number]     DOB: May 10, 1951  Procedure Date: 05/07/2013  Nuclear Med Background Indication for Stress Test:  Evaluation for Ischemia History:  Chronic AFIB, > 15 yrs MPS, '09 ECHO EF:  55-60% Cardiac Risk Factors: Family History - CAD, History of Smoking, Hypertension, Lipids and NIDDM  Symptoms:  Palpitations   Nuclear Pre-Procedure Caffeine/Decaff Intake:  None > 12 hrs NPO After: 7:30pm   Lungs:  clear O2 Sat: 94% on room air. IV 0.9% NS with Angio Cath:  20g  IV Site: R Antecubital x 1, tolerated well IV Started by:  Irven Baltimore, RN  Chest Size (in):  56 Cup Size: n/a  Height: 6' 1"  (1.854 m)  Weight:  286 lb (129.729 kg)  BMI:  Body mass index is 37.74 kg/(m^2). Tech Comments:  No medications today; held Bystolic x 24 hrs    Nuclear Med Study 1 or 2 day study: 2 day  Stress Test Type:  Stress  Reading MD: Loralie Champagne, MD  Order Authorizing Provider:  Peter Martinique, MD  Resting Radionuclide: Technetium 26mSestamibi  Resting Radionuclide Dose: 33.0 mCi  05/01/13  Stress Radionuclide:  Technetium 964mestamibi  Stress Radionuclide Dose: 33.0 mCi  05/07/13          Stress Protocol Rest HR: 75 Stress HR: 142  Rest BP: 125/89 Stress BP: 168/79  Exercise Time (min): 5:02 METS: 7.0   Predicted Max HR: 156 bpm % Max HR: 91.03 bpm Rate Pressure Product: 23856   Dose of Adenosine (mg):  n/a Dose of Lexiscan: n/a mg  Dose of Atropine (mg): n/a Dose of Dobutamine: n/a mcg/kg/min (at max HR)  Stress Test Technologist: SaPerrin MalteseEMT-P  Nuclear Technologist:  ToAnnye RuskCNMT     Rest Procedure:  Myocardial perfusion imaging was performed at rest 45 minutes following the intravenous administration of Technetium 9926mstamibi. Rest ECG: NSR - Normal EKG and Atrial Fibrilliation  Stress  Procedure:  The patient exercised on the treadmill utilizing the Bruce Protocol for 5:02 minutes. The patient stopped due to reaching his THR. and denied any chest pain.  Technetium 46m50mtamibi was injected at peak exercise and myocardial perfusion imaging was performed after a brief delay. Stress ECG: No significant change from baseline ECG  QPS Raw Data Images:  Normal; no motion artifact; normal heart/lung ratio. Stress Images:  Small, mild apical septal perfusion defect. Rest Images:  Small, mild apical septal perfusion defect. Subtraction (SDS):  Fixed, small mild apical septal perfusion defect.  Transient Ischemic Dilatation (Normal <1.22):  n/a Lung/Heart Ratio (Normal <0.45):  0.51  Quantitative Gated Spect Images QGS EDV:  n/a ml QGS ESV:  n/a ml  Impression Exercise Capacity:  Below average. BP Response:  Normal blood pressure response. Clinical Symptoms:  Fatigue, no chest pain.  ECG Impression:  No significant ST segment change suggestive of ischemia. Comparison with Prior Nuclear Study: No images to compare  Overall Impression:  Low risk stress nuclear study.  There was a fixed small, mild apical septal perfusion defect.  This represents prior MI versus attenuation.  No ischemia.   LV Ejection Fraction: Study not gated.  LV Wall Motion:  Not gated.   DaltLoralie Champagne/2014

## 2013-05-07 NOTE — Telephone Encounter (Signed)
Spoke with pharmacist & refilled pradaxa. Was e-scribed on 04/12/13 but not received by Meade Maw RN

## 2013-08-08 ENCOUNTER — Other Ambulatory Visit: Payer: Self-pay

## 2014-02-12 ENCOUNTER — Other Ambulatory Visit: Payer: Self-pay | Admitting: Orthopedic Surgery

## 2014-02-14 ENCOUNTER — Telehealth: Payer: Self-pay | Admitting: Cardiology

## 2014-02-14 NOTE — Telephone Encounter (Signed)
Received request from Nurse fax box, documents faxed for surgical clearance. To: [employer] Fax number: [phone] Attention: 5.15.15/km

## 2014-03-10 ENCOUNTER — Other Ambulatory Visit: Payer: Self-pay | Admitting: Orthopedic Surgery

## 2014-03-10 NOTE — H&P (Signed)
Dan [name] is an 63 y.o. male.   Chief Complaint: left knee pain HPI: The patient is a 63 year old male who presents today for follow up of their knee. The patient is being followed for their left knee pain. They are now 2 year(s) out from when symptoms began. Symptoms reported today include: pain. and report their pain level to be moderate. The following medication has been used for pain control: none. Note for "Follow-up Knee": The patient is requesting a cortisone injection today. The patient is scheduled for a left knee MRI tomorrow and Dr. Tonita Cong has already discussed arthroscopy with the pt multiple times but his MRI is pending prior to the scope. He reports ongoing stiffness, swelling, mechanical symptoms. Stiffness in the AM and after sitting. He does feel better once he gets moving. He will be on his feet on concrete all day the next week with the furniture market in town. We did previously fax a clearance letter for L knee scope to Dr. Reynaldo Minium in October 2014 when we initially planned to proceed with surgery but never received clerance on him.   Past Medical History  Diagnosis Date  . Chronic atrial fibrillation   . Hypertension   . Hyperlipidemia   . Morbid obesity   . BPH (benign prostatic hypertrophy)   . Diabetes mellitus     Past Surgical History  Procedure Laterality Date  . US echocardiography  08/08/2006    ef 55-60%    Family History  Problem Relation Age of Onset  . Heart attack Father    Social History:  reports that he quit smoking about 22 years ago. He does not have any smokeless tobacco history on file. He reports that he does not drink alcohol or use illicit drugs.  Allergies: No Known Allergies   (Not in a hospital admission)  No results found for this or any previous visit (from the past 48 hour(s)). No results found.  Review of Systems  Constitutional: Negative.   HENT: Negative.   Eyes: Negative.   Respiratory: Negative.    Cardiovascular: Negative.   Gastrointestinal: Negative.   Genitourinary: Negative.   Musculoskeletal: Positive for joint pain.  Skin: Negative.   Neurological: Negative.   Psychiatric/Behavioral: Negative.     There were no vitals taken for this visit. Physical Exam  Constitutional: He is oriented to person, place, and time. He appears well-developed and well-nourished.  HENT:  Head: Normocephalic and atraumatic.  Eyes: Conjunctivae and EOM are normal. Pupils are equal, round, and reactive to light.  Neck: Normal range of motion. Neck supple.  Cardiovascular: Normal rate and regular rhythm.   Respiratory: Effort normal and breath sounds normal.  GI: Soft. Bowel sounds are normal.  Musculoskeletal:  General Mental Status - Alert. General Appearance- pleasant. Not in acute distress. Orientation- Oriented X3. Build & Nutrition- Obese. Gait- Stiff and Antalgic.  Musculoskeletal Lower Extremity Left Lower Extremity: Left Knee: Inspection and Palpation:Tenderness- medial joint line tender to palpation. no tenderness to palpation of the superior calf, no tenderness to palpation of the quadriceps tendon, no tenderness to palpation of the patellar tendon, no tenderness to palpation of the patella, no tenderness to palpation of the lateral joint line, no tenderness to palpation of the fibular head and no tenderness to palpation of the peroneal nerve. Crepitus- mild patellofemoral crepitus. Patellar Tendon- no pain to palpation of the patellar tendon. Swelling- periarticular swelling present. Effusion- mild. Tissue tension/texture is - soft. Pulses- 2+. Sensation- intact to light touch. Skin-  Color- no ecchymosis and no erythema. Strength and Tone:Quadriceps- 5/5. Hamstrings- 5/5. ROM: Flexion:AROM- 120 . Extension:AROM- 0 . Stability- Valgus Laxity at 30- None. Valgus Laxity at 0- None. Varus Laxity at 30- None. Varus Laxity at 0- None. Lachman-  Negative. Anterior Drawer Test- Negative. Posterior Drawer Test- Negative. Deformities/Malalignments/Discrepancies- no deformities noted. Special Tests:McMurray Test (lateral) - negative. McMurray Test (medial)- Note: equivocal Patellar Compression Pain- mild pain.  Neurological: He is alert and oriented to person, place, and time. He has normal reflexes.  Skin: Skin is warm and dry.  Psychiatric: He has a normal mood and affect.    AP standing and lateral knee xrays demonstrate medial joint space narrowing  MRI with MMT, LMT, Baker's cyst, Effusion, chondromalacia  Assessment/Plan L knee DJD, MMT, LMT  Pt with L knee pain, DJD, meniscus tear, with ongoing stiffness, swelling, mechanical symptoms. We have already discussed knee scope. We again discussed proceeding with L knee arthroscopy, debridement. Discussed the procedure itself as well as risks, complications and alternatives, including but not limited to DVT, PE, infx, bleeding, failure of procedure, need for secondary procedure, ongoing pain/symptoms, anesthesia risk, even stroke or death. Also discussed typical post-op protocols, time out of work, ice and elevation, HEP/quad strengthening, possible formal PT. Discussed need for DVT ppx post-op with ASA per protocol. We discussed the possibility of worsening pain and stiffness post-operatively due to DJD and the possible need for future steroid injections, viscosupplementation, repeat arthroscopy, even TKA All questions were answered. Patient desires to proceed with surgery. We discussed likely occult meniscal tear, chondral flap tear causing his ongoing mechanical symptoms. We proceeded with intra-articular steroid injection today. I would like him to attempt to get medical clearance from his PCP, Dr. Reynaldo Minium, prior to scheduling, he was given a clearance letter.  We will proceed accordingly. He will follow up 10-14 days post-op and will call with any questions or concerns in the  interim.  Plan left knee arthroscopy, debridement  Conley Rolls. Bissell PA-C for Dr. Tonita Cong 03/10/2014, 1:20 PM

## 2014-03-21 ENCOUNTER — Encounter (HOSPITAL_BASED_OUTPATIENT_CLINIC_OR_DEPARTMENT_OTHER): Payer: Self-pay | Admitting: *Deleted

## 2014-03-21 NOTE — Progress Notes (Signed)
To [facility] at 1100-[facility] on arrival -Ekg with chart-instructed Npo after Mn-will take bystolic,norvasc with small amt water.to bring cpap machine.

## 2014-03-28 ENCOUNTER — Ambulatory Visit (HOSPITAL_BASED_OUTPATIENT_CLINIC_OR_DEPARTMENT_OTHER): Payer: 59 | Admitting: Anesthesiology

## 2014-03-28 ENCOUNTER — Ambulatory Visit (HOSPITAL_BASED_OUTPATIENT_CLINIC_OR_DEPARTMENT_OTHER)
Admission: RE | Admit: 2014-03-28 | Discharge: 2014-03-28 | Disposition: A | Discharge status: Home / Self Care | Payer: 59 | Source: Ambulatory Visit | Attending: Specialist | Admitting: Specialist

## 2014-03-28 ENCOUNTER — Encounter (HOSPITAL_BASED_OUTPATIENT_CLINIC_OR_DEPARTMENT_OTHER): Payer: 59 | Admitting: Anesthesiology

## 2014-03-28 ENCOUNTER — Encounter (HOSPITAL_BASED_OUTPATIENT_CLINIC_OR_DEPARTMENT_OTHER): Payer: Self-pay | Admitting: Anesthesiology

## 2014-03-28 ENCOUNTER — Encounter (HOSPITAL_BASED_OUTPATIENT_CLINIC_OR_DEPARTMENT_OTHER)
Admission: RE | Disposition: A | Discharge status: Home / Self Care | Payer: Self-pay | Source: Ambulatory Visit | Attending: Specialist

## 2014-03-28 DIAGNOSIS — I4891 Unspecified atrial fibrillation: Secondary | ICD-10-CM | POA: Insufficient documentation

## 2014-03-28 DIAGNOSIS — Z87891 Personal history of nicotine dependence: Secondary | ICD-10-CM | POA: Insufficient documentation

## 2014-03-28 DIAGNOSIS — M1712 Unilateral primary osteoarthritis, left knee: Secondary | ICD-10-CM

## 2014-03-28 DIAGNOSIS — I10 Essential (primary) hypertension: Secondary | ICD-10-CM | POA: Insufficient documentation

## 2014-03-28 DIAGNOSIS — E119 Type 2 diabetes mellitus without complications: Secondary | ICD-10-CM | POA: Insufficient documentation

## 2014-03-28 DIAGNOSIS — M2240 Chondromalacia patellae, unspecified knee: Secondary | ICD-10-CM | POA: Insufficient documentation

## 2014-03-28 DIAGNOSIS — Z79899 Other long term (current) drug therapy: Secondary | ICD-10-CM | POA: Insufficient documentation

## 2014-03-28 DIAGNOSIS — M23329 Other meniscus derangements, posterior horn of medial meniscus, unspecified knee: Secondary | ICD-10-CM | POA: Insufficient documentation

## 2014-03-28 DIAGNOSIS — M23302 Other meniscus derangements, unspecified lateral meniscus, unspecified knee: Secondary | ICD-10-CM | POA: Insufficient documentation

## 2014-03-28 DIAGNOSIS — M1710 Unilateral primary osteoarthritis, unspecified knee: Secondary | ICD-10-CM | POA: Insufficient documentation

## 2014-03-28 DIAGNOSIS — Z6837 Body mass index (BMI) 37.0-37.9, adult: Secondary | ICD-10-CM | POA: Insufficient documentation

## 2014-03-28 DIAGNOSIS — G4730 Sleep apnea, unspecified: Secondary | ICD-10-CM | POA: Insufficient documentation

## 2014-03-28 HISTORY — PX: KNEE ARTHROSCOPY: SHX127

## 2014-03-28 HISTORY — DX: Sleep apnea, unspecified: G47.30

## 2014-03-28 LAB — GLUCOSE, CAPILLARY: Glucose-Capillary: 161 mg/dL — ABNORMAL HIGH (ref 70–99)

## 2014-03-28 LAB — POCT I-STAT 4, (NA,K, GLUC, HGB,HCT)
Glucose, Bld: 179 mg/dL — ABNORMAL HIGH (ref 70–99)
HCT: 45 % (ref 39.0–52.0)
Hemoglobin: 15.3 g/dL (ref 13.0–17.0)
Potassium: 3.5 mEq/L — ABNORMAL LOW (ref 3.7–5.3)
SODIUM: 141 meq/L (ref 137–147)

## 2014-03-28 SURGERY — ARTHROSCOPY, KNEE
Anesthesia: General | Site: Knee | Laterality: Left

## 2014-03-28 MED ORDER — OXYCODONE-ACETAMINOPHEN 5-325 MG PO TABS: 1.0000 | ORAL_TABLET | ORAL | Status: DC | PRN

## 2014-03-28 MED ORDER — KETOROLAC TROMETHAMINE 30 MG/ML IJ SOLN
INTRAMUSCULAR | Status: DC | PRN
  Administered 2014-03-28: 30 mg via INTRAVENOUS

## 2014-03-28 MED ORDER — LACTATED RINGERS IV SOLN
INTRAVENOUS | Status: DC
  Administered 2014-03-28: 12:00:00 via INTRAVENOUS
  Filled 2014-03-28: qty 1000

## 2014-03-28 MED ORDER — BUPIVACAINE-EPINEPHRINE 0.5% -1:200000 IJ SOLN
INTRAMUSCULAR | Status: DC | PRN
  Administered 2014-03-28: 20 mL

## 2014-03-28 MED ORDER — SODIUM CHLORIDE 0.9 % IR SOLN
Status: DC | PRN
  Administered 2014-03-28: 14:00:00

## 2014-03-28 MED ORDER — EPINEPHRINE HCL 1 MG/ML IJ SOLN
INTRAMUSCULAR | Status: DC | PRN
  Administered 2014-03-28: 14:00:00

## 2014-03-28 MED ORDER — ACETAMINOPHEN 10 MG/ML IV SOLN
INTRAVENOUS | Status: DC | PRN
  Administered 2014-03-28: 1000 mg via INTRAVENOUS

## 2014-03-28 MED ORDER — LACTATED RINGERS IV SOLN
INTRAVENOUS | Status: DC
Start: 2014-03-28 — End: 2014-03-28
  Filled 2014-03-28: qty 1000

## 2014-03-28 MED ORDER — MIDAZOLAM HCL 2 MG/2ML IJ SOLN
INTRAMUSCULAR | Status: AC
  Filled 2014-03-28: qty 2

## 2014-03-28 MED ORDER — FENTANYL CITRATE 0.05 MG/ML IJ SOLN
INTRAMUSCULAR | Status: DC | PRN
  Administered 2014-03-28 (×8): 25 ug via INTRAVENOUS

## 2014-03-28 MED ORDER — LIDOCAINE HCL (CARDIAC) 20 MG/ML IV SOLN
INTRAVENOUS | Status: DC | PRN
  Administered 2014-03-28: 100 mg via INTRAVENOUS

## 2014-03-28 MED ORDER — PROMETHAZINE HCL 25 MG/ML IJ SOLN
6.2500 mg | INTRAMUSCULAR | Status: DC | PRN
  Filled 2014-03-28: qty 1

## 2014-03-28 MED ORDER — ONDANSETRON HCL 4 MG/2ML IJ SOLN
INTRAMUSCULAR | Status: DC | PRN
  Administered 2014-03-28: 4 mg via INTRAVENOUS

## 2014-03-28 MED ORDER — LACTATED RINGERS IV SOLN
INTRAVENOUS | Status: DC
  Filled 2014-03-28: qty 1000

## 2014-03-28 MED ORDER — MIDAZOLAM HCL 5 MG/5ML IJ SOLN
INTRAMUSCULAR | Status: DC | PRN
  Administered 2014-03-28 (×2): 1 mg via INTRAVENOUS

## 2014-03-28 MED ORDER — DEXTROSE 5 % IV SOLN
3.0000 g | INTRAVENOUS | Status: AC
  Administered 2014-03-28: 3 g via INTRAVENOUS
  Filled 2014-03-28: qty 3000

## 2014-03-28 MED ORDER — FENTANYL CITRATE 0.05 MG/ML IJ SOLN
INTRAMUSCULAR | Status: AC
  Filled 2014-03-28: qty 4

## 2014-03-28 MED ORDER — DEXAMETHASONE SODIUM PHOSPHATE 4 MG/ML IJ SOLN
INTRAMUSCULAR | Status: DC | PRN
  Administered 2014-03-28: 4 mg via INTRAVENOUS

## 2014-03-28 MED ORDER — DOCUSATE SODIUM 100 MG PO CAPS: 100.0000 mg | ORAL_CAPSULE | Freq: Two times a day (BID) | ORAL | Status: DC | PRN

## 2014-03-28 MED ORDER — PROPOFOL 10 MG/ML IV BOLUS
INTRAVENOUS | Status: DC | PRN
  Administered 2014-03-28: 250 mg via INTRAVENOUS

## 2014-03-28 MED ORDER — LACTATED RINGERS IV SOLN
INTRAVENOUS | Status: DC | PRN
  Administered 2014-03-28: 12:00:00 via INTRAVENOUS

## 2014-03-28 MED ORDER — HYDROMORPHONE HCL PF 1 MG/ML IJ SOLN
0.2500 mg | INTRAMUSCULAR | Status: DC | PRN
  Filled 2014-03-28: qty 1

## 2014-03-28 SURGICAL SUPPLY — 47 items
BANDAGE ELASTIC 6 VELCRO ST LF (GAUZE/BANDAGES/DRESSINGS) ×3 IMPLANT
BLADE 4.2CUDA (BLADE) IMPLANT
BLADE CUDA SHAVER 3.5 (BLADE) ×3 IMPLANT
BLADE GREAT WHITE 4.2 (BLADE) IMPLANT
BLADE GREAT WHITE 4.2MM (BLADE)
BNDG COHESIVE 6X5 TAN NS LF (GAUZE/BANDAGES/DRESSINGS) ×3 IMPLANT
BOOTIES KNEE HIGH SLOAN (MISCELLANEOUS) ×3 IMPLANT
CANISTER SUCT LVC 12 LTR MEDI- (MISCELLANEOUS) ×3 IMPLANT
CANISTER SUCTION 1200CC (MISCELLANEOUS) IMPLANT
CANISTER SUCTION 2500CC (MISCELLANEOUS) IMPLANT
CANNULA ACUFLEX KIT 5X76 (CANNULA) IMPLANT
CLOTH BEACON ORANGE TIMEOUT ST (SAFETY) ×3 IMPLANT
CUTTER MENISCUS  4.2MM (BLADE)
CUTTER MENISCUS 4.2MM (BLADE) IMPLANT
DRAPE ARTHROSCOPY W/POUCH 114 (DRAPES) ×3 IMPLANT
DURAPREP 26ML APPLICATOR (WOUND CARE) ×3 IMPLANT
ELECT REM PT RETURN 9FT ADLT (ELECTROSURGICAL)
ELECTRODE REM PT RTRN 9FT ADLT (ELECTROSURGICAL) IMPLANT
GLOVE BIOGEL PI IND STRL 7.5 (GLOVE) IMPLANT
GLOVE BIOGEL PI INDICATOR 7.5 (GLOVE) ×2
GLOVE SURG SS PI 7.5 STRL IVOR (GLOVE) ×2 IMPLANT
GLOVE SURG SS PI 8.0 STRL IVOR (GLOVE) ×3 IMPLANT
GOWN STRL REUS W/ TWL XL LVL3 (GOWN DISPOSABLE) IMPLANT
GOWN STRL REUS W/TWL XL LVL3 (GOWN DISPOSABLE) ×6
IV NS IRRIG 3000ML ARTHROMATIC (IV SOLUTION) ×6 IMPLANT
KNEE WRAP E Z 3 GEL PACK (MISCELLANEOUS) ×3 IMPLANT
MINI VAC (SURGICAL WAND) IMPLANT
NDL FILTER BLUNT 18X1 1/2 (NEEDLE) ×1 IMPLANT
NDL SAFETY ECLIPSE 18X1.5 (NEEDLE) ×1 IMPLANT
NEEDLE FILTER BLUNT 18X 1/2SAF (NEEDLE) ×2
NEEDLE FILTER BLUNT 18X1 1/2 (NEEDLE) ×1 IMPLANT
NEEDLE HYPO 18GX1.5 SHARP (NEEDLE) ×3
PACK ARTHROSCOPY DSU (CUSTOM PROCEDURE TRAY) ×3 IMPLANT
PACK BASIN DAY SURGERY FS (CUSTOM PROCEDURE TRAY) ×3 IMPLANT
PADDING CAST ABS 6INX4YD NS (CAST SUPPLIES) ×2
PADDING CAST ABS COTTON 6X4 NS (CAST SUPPLIES) IMPLANT
PADDING CAST COTTON 6X4 STRL (CAST SUPPLIES) ×3 IMPLANT
RESECTOR FULL RADIUS 4.2MM (BLADE) IMPLANT
SET ARTHROSCOPY TUBING (MISCELLANEOUS) ×3
SET ARTHROSCOPY TUBING LN (MISCELLANEOUS) ×1 IMPLANT
SPONGE GAUZE 4X4 12PLY (GAUZE/BANDAGES/DRESSINGS) ×3 IMPLANT
SUT ETHILON 4 0 PS 2 18 (SUTURE) ×3 IMPLANT
SYR 30ML LL (SYRINGE) ×3 IMPLANT
SYRINGE 10CC LL (SYRINGE) ×3 IMPLANT
TOWEL OR 17X24 6PK STRL BLUE (TOWEL DISPOSABLE) ×3 IMPLANT
WAND 90 DEG TURBOVAC W/CORD (SURGICAL WAND) ×2 IMPLANT
WATER STERILE IRR 500ML POUR (IV SOLUTION) ×3 IMPLANT

## 2014-03-28 NOTE — Anesthesia Procedure Notes (Signed)
Procedure Name: LMA Insertion Date/Time: 03/28/2014 1:20 PM Performed by: Justice Rocher Pre-anesthesia Checklist: Patient identified, Emergency Drugs available, Suction available and Patient being monitored Patient Re-evaluated:Patient Re-evaluated prior to inductionOxygen Delivery Method: Circle System Utilized Preoxygenation: Pre-oxygenation with 100% oxygen Intubation Type: IV induction Ventilation: Mask ventilation without difficulty LMA: LMA inserted LMA Size: 5.0 Number of attempts: 1 Airway Equipment and Method: bite block Placement Confirmation: positive ETCO2 Tube secured with: Tape Dental Injury: Teeth and Oropharynx as per pre-operative assessment  Comments: Pt positioned supine once in room awake with FM 100 % O 2, Grey shoulder wedge with yellow gel pillow under airway for best ventilation neutral

## 2014-03-28 NOTE — Transfer of Care (Signed)
  Immediate Anesthesia Transfer of Care Note  Patient: Dan [name]  Procedure(s) Performed: Procedure(s) (LRB): LEFT KNEE ARTHROSCOPY WITH DEBRIDEMENT   (Left)  Patient Location: PACU  Anesthesia Type: General  Level of Consciousness: awake, sedated, patient cooperative and responds to stimulation  Airway & Oxygen Therapy: Patient Spontanous Breathing and Patient connected to face mask oxygen  Post-op Assessment: Report given to PACU RN, Post -op Vital signs reviewed and stable and Patient moving all extremities  Post vital signs: Reviewed and stable  Complications: No apparent anesthesia complications

## 2014-03-28 NOTE — Anesthesia Preprocedure Evaluation (Addendum)
Anesthesia Evaluation    Airway Mallampati: III TM Distance: >3 FB Neck ROM: Full    Dental  (+) Teeth Intact, Dental Advisory Given   Pulmonary sleep apnea and Continuous Positive Airway Pressure Ventilation , former smoker,  breath sounds clear to auscultation  Pulmonary exam normal       Cardiovascular hypertension, Pt. on medications and Pt. on home beta blockers + dysrhythmias Atrial Fibrillation Rhythm:Regular Rate:Normal     Neuro/Psych    GI/Hepatic   Endo/Other  diabetes, Type 2, Oral Hypoglycemic AgentsMorbid obesity  Renal/GU      Musculoskeletal   Abdominal   Peds  Hematology   Anesthesia Other Findings Patient states he discontinued Pradaxa on 6/23.  Reproductive/Obstetrics                          Anesthesia Physical Anesthesia Plan  ASA: III  Anesthesia Plan: General   Post-op Pain Management:    Induction: Intravenous  Airway Management Planned: LMA  Additional Equipment:   Intra-op Plan:   Post-operative Plan: Extubation in OR  Informed Consent: I have reviewed the patients History and Physical, chart, labs and discussed the procedure including the risks, benefits and alternatives for the proposed anesthesia with the patient or authorized representative who has indicated his/her understanding and acceptance.   Dental advisory given  Plan Discussed with: CRNA  Anesthesia Plan Comments:         Anesthesia Quick Evaluation

## 2014-03-28 NOTE — Interval H&P Note (Signed)
History and Physical Interval Note:  03/28/2014 12:58 PM  Dan [name]  has presented today for surgery, with the diagnosis of RIGHT KNEE DEGENERATIVE JOINT DISEASE   The various methods of treatment have been discussed with the patient and family. After consideration of risks, benefits and other options for treatment, the patient has consented to  Procedure(s): LEFT KNEE ARTHROSCOPY WITH DEBRIDEMENT   (Left) as a surgical intervention .  The patient's history has been reviewed, patient examined, no change in status, stable for surgery.  I have reviewed the patient's chart and labs.  Questions were answered to the patient's satisfaction.     BEANE,JEFFREY C

## 2014-03-28 NOTE — Brief Op Note (Signed)
03/28/2014  1:57 PM  PATIENT:  Dan [name]  63 y.o. male  PRE-OPERATIVE DIAGNOSIS:  RIGHT KNEE DEGENERATIVE JOINT DISEASE   POST-OPERATIVE DIAGNOSIS:  RIGHT KNEE DEGENERATIVE JOINT DISEASE   PROCEDURE:  Procedure(s): LEFT KNEE ARTHROSCOPY WITH DEBRIDEMENT   (Left)  SURGEON:  Surgeon(s) and Role:    * Johnn Hai, MD - Primary  PHYSICIAN ASSISTANT:   ASSISTANTS: none   ANESTHESIA:   general  EBL:  Total I/O In: 200 [I.V.:200] Out: -   BLOOD ADMINISTERED:none  DRAINS: none   LOCAL MEDICATIONS USED:  MARCAINE     SPECIMEN:  No Specimen  DISPOSITION OF SPECIMEN:  N/A  COUNTS:  YES  TOURNIQUET:  * No tourniquets in log *  DICTATION: .Other Dictation: Dictation Number [id number]  PLAN OF CARE: Discharge to home after PACU  PATIENT DISPOSITION:  PACU - hemodynamically stable.   Delay start of Pharmacological VTE agent (>24hrs) due to surgical blood loss or risk of bleeding: no

## 2014-03-28 NOTE — Discharge Instructions (Signed)
ARTHROSCOPIC KNEE SURGERY HOME CARE INSTRUCTIONS ° ° °PAIN °You will be expected to have a moderate amount of pain in the affected knee for approximately two weeks.  However, the first two to four days will be the most severe in terms of the pain you will experience.  Prescriptions have been provided for you to take as needed for the pain.  The pain can be markedly reduced by using the ice/compressive bandage given.  Exchange the ice packs whenever they thaw.  During the night, keep the bandage on because it will still provide some compression for the swelling.  Also, keep the leg elevated on pillows above your heart, and this will help alleviate the pain and swelling. ° °MEDICATION °Prescriptions have been provided to take as needed for pain. To prevent blood clots, take Aspirin 325mg daily with a meal if not on a blood thinner and if no history of stomach ulcers. ° °ACTIVITY °It is preferred that you stay on bedrest for approximately 24 hours.  However, you may go to the bathroom with help.  After this, you can start to be up and about progressively more.  Remember that the swelling may still increase after three to four days if you are up and doing too much.  You may put as much weight on the affected leg as pain will allow.  Use your crutches for comfort and safety.  However, as soon as you are able, you may discard the crutches and go without them.  ° °DRESSING °Keep the current dressing as dry as possible.  Two days after your surgery, you may remove the ice/compressive wrap, and surgical dressing.  You may now take a shower, but do not scrub the sounds directly with soap.  Let water rinse over these and gently wipe with your hand.  Reapply band-aids over the puncture wounds and more gauze if needed.  A slight amount of thin drainage can be normal at this time, and do not let it frighten you.  Reapply the ice/compressive wrap.  You may now repeat this every day each time you shower. ° °SYMPTOMS TO REPORT TO  YOUR DOCTOR ° -Extreme pain. ° -Extreme swelling. ° -Temperature above 101 degrees that does not come down with acetaminophen     (Tylenol). ° -Any changes in the feeling, color or movement of your toes. ° -Extreme redness, heat, swelling or drainage at your incision ° °EXERCISE °It is preferred that you begin to exercise on the day of your surgery.  Straight leg raises and short arc quads should be begun the afternoon or evening of surgery and continued until you come back for your follow-up appointment.   Attached is an instruction sheet on how to perform these two simple exercises.  Do these at least three times per day if not more.  You may bend your knee as much as is comfortable.  The puncture wounds may occasionally be slightly uncomfortable with bending of the knee.  Do not let this frighten you.  It is important to keep your knee motion, but do not overdo it.  If you have significant pain, simply do not bend the knee as far.   You will be given more exercises to perform at your first return visit.   ° °RETURN APPOINTMENT °Please make an appointment to be seen by your doctor in 14 days from your surgery. ° °Patient Signature:  ________________________________________________________ ° °Nurse's Signature:  ________________________________________________________ ° ° °Post Anesthesia Home Care Instructions ° °Activity: °Get plenty of   rest for the remainder of the day. A responsible adult should stay with you for 24 hours following the procedure.  °For the next 24 hours, DO NOT: °-Drive a car °-Operate machinery °-Drink alcoholic beverages °-Take any medication unless instructed by your physician °-Make any legal decisions or sign important papers. ° °Meals: °Start with liquid foods such as gelatin or soup. Progress to regular foods as tolerated. Avoid greasy, spicy, heavy foods. If nausea and/or vomiting occur, drink only clear liquids until the nausea and/or vomiting subsides. Call your physician if vomiting  continues. ° °Special Instructions/Symptoms: °Your throat may feel dry or sore from the anesthesia or the breathing tube placed in your throat during surgery. If this causes discomfort, gargle with warm salt water. The discomfort should disappear within 24 hours. ° °

## 2014-03-28 NOTE — H&P (View-Only) (Signed)
Dan [name] is an 63 y.o. male.   Chief Complaint: left knee pain HPI: The patient is a 63 year old male who presents today for follow up of their knee. The patient is being followed for their left knee pain. They are now 2 year(s) out from when symptoms began. Symptoms reported today include: pain. and report their pain level to be moderate. The following medication has been used for pain control: none. Note for "Follow-up Knee": The patient is requesting a cortisone injection today. The patient is scheduled for a left knee MRI tomorrow and Dr. Tonita Cong has already discussed arthroscopy with the pt multiple times but his MRI is pending prior to the scope. He reports ongoing stiffness, swelling, mechanical symptoms. Stiffness in the AM and after sitting. He does feel better once he gets moving. He will be on his feet on concrete all day the next week with the furniture market in town. We did previously fax a clearance letter for L knee scope to Dr. Reynaldo Minium in October 2014 when we initially planned to proceed with surgery but never received clerance on him.   Past Medical History  Diagnosis Date  . Chronic atrial fibrillation   . Hypertension   . Hyperlipidemia   . Morbid obesity   . BPH (benign prostatic hypertrophy)   . Diabetes mellitus     Past Surgical History  Procedure Laterality Date  . US echocardiography  08/08/2006    ef 55-60%    Family History  Problem Relation Age of Onset  . Heart attack Father    Social History:  reports that he quit smoking about 22 years ago. He does not have any smokeless tobacco history on file. He reports that he does not drink alcohol or use illicit drugs.  Allergies: No Known Allergies   (Not in a hospital admission)  No results found for this or any previous visit (from the past 48 hour(s)). No results found.  Review of Systems  Constitutional: Negative.   HENT: Negative.   Eyes: Negative.   Respiratory: Negative.    Cardiovascular: Negative.   Gastrointestinal: Negative.   Genitourinary: Negative.   Musculoskeletal: Positive for joint pain.  Skin: Negative.   Neurological: Negative.   Psychiatric/Behavioral: Negative.     There were no vitals taken for this visit. Physical Exam  Constitutional: He is oriented to person, place, and time. He appears well-developed and well-nourished.  HENT:  Head: Normocephalic and atraumatic.  Eyes: Conjunctivae and EOM are normal. Pupils are equal, round, and reactive to light.  Neck: Normal range of motion. Neck supple.  Cardiovascular: Normal rate and regular rhythm.   Respiratory: Effort normal and breath sounds normal.  GI: Soft. Bowel sounds are normal.  Musculoskeletal:  General Mental Status - Alert. General Appearance- pleasant. Not in acute distress. Orientation- Oriented X3. Build & Nutrition- Obese. Gait- Stiff and Antalgic.  Musculoskeletal Lower Extremity Left Lower Extremity: Left Knee: Inspection and Palpation:Tenderness- medial joint line tender to palpation. no tenderness to palpation of the superior calf, no tenderness to palpation of the quadriceps tendon, no tenderness to palpation of the patellar tendon, no tenderness to palpation of the patella, no tenderness to palpation of the lateral joint line, no tenderness to palpation of the fibular head and no tenderness to palpation of the peroneal nerve. Crepitus- mild patellofemoral crepitus. Patellar Tendon- no pain to palpation of the patellar tendon. Swelling- periarticular swelling present. Effusion- mild. Tissue tension/texture is - soft. Pulses- 2+. Sensation- intact to light touch. Skin-  Color- no ecchymosis and no erythema. Strength and Tone:Quadriceps- 5/5. Hamstrings- 5/5. ROM: Flexion:AROM- 120 . Extension:AROM- 0 . Stability- Valgus Laxity at 30- None. Valgus Laxity at 0- None. Varus Laxity at 30- None. Varus Laxity at 0- None. Lachman-  Negative. Anterior Drawer Test- Negative. Posterior Drawer Test- Negative. Deformities/Malalignments/Discrepancies- no deformities noted. Special Tests:McMurray Test (lateral) - negative. McMurray Test (medial)- Note: equivocal Patellar Compression Pain- mild pain.  Neurological: He is alert and oriented to person, place, and time. He has normal reflexes.  Skin: Skin is warm and dry.  Psychiatric: He has a normal mood and affect.    AP standing and lateral knee xrays demonstrate medial joint space narrowing  MRI with MMT, LMT, Baker's cyst, Effusion, chondromalacia  Assessment/Plan L knee DJD, MMT, LMT  Pt with L knee pain, DJD, meniscus tear, with ongoing stiffness, swelling, mechanical symptoms. We have already discussed knee scope. We again discussed proceeding with L knee arthroscopy, debridement. Discussed the procedure itself as well as risks, complications and alternatives, including but not limited to DVT, PE, infx, bleeding, failure of procedure, need for secondary procedure, ongoing pain/symptoms, anesthesia risk, even stroke or death. Also discussed typical post-op protocols, time out of work, ice and elevation, HEP/quad strengthening, possible formal PT. Discussed need for DVT ppx post-op with ASA per protocol. We discussed the possibility of worsening pain and stiffness post-operatively due to DJD and the possible need for future steroid injections, viscosupplementation, repeat arthroscopy, even TKA All questions were answered. Patient desires to proceed with surgery. We discussed likely occult meniscal tear, chondral flap tear causing his ongoing mechanical symptoms. We proceeded with intra-articular steroid injection today. I would like him to attempt to get medical clearance from his PCP, Dr. Reynaldo Minium, prior to scheduling, he was given a clearance letter.  We will proceed accordingly. He will follow up 10-14 days post-op and will call with any questions or concerns in the  interim.  Plan left knee arthroscopy, debridement  Conley Rolls. Bissell PA-C for Dr. Tonita Cong 03/10/2014, 1:20 PM

## 2014-03-31 ENCOUNTER — Encounter (HOSPITAL_BASED_OUTPATIENT_CLINIC_OR_DEPARTMENT_OTHER): Payer: Self-pay | Admitting: Specialist

## 2014-03-31 NOTE — Anesthesia Postprocedure Evaluation (Signed)
Anesthesia Post Note  Patient: Dan [name]  Procedure(s) Performed: Procedure(s) (LRB): LEFT KNEE ARTHROSCOPY WITH DEBRIDEMENT   (Left)  Anesthesia type: General  Patient location: PACU  Post pain: Pain level controlled  Post assessment: Post-op Vital signs reviewed  Last Vitals: BP 139/93  Pulse 85  Temp(Src) 36.7 C (Oral)  Resp 20  Ht 6\' 1"  (1.854 m)  Wt 286 lb (129.729 kg)  BMI 37.74 kg/m2  SpO2 92%  Post vital signs: Reviewed  Level of consciousness: sedated  Complications: No apparent anesthesia complications

## 2014-03-31 NOTE — Op Note (Signed)
NAMEAUNDRE, Dan [name] NO.:  [id number]  MEDICAL RECORD NO.:  [id number]  LOCATION:                                 FACILITY:  PHYSICIAN:  Susa Day, M.D.    DATE OF BIRTH:  1950-12-21  DATE OF PROCEDURE:  03/28/2014 DATE OF DISCHARGE:                              OPERATIVE REPORT   PREOPERATIVE DIAGNOSIS:  Degenerative joint disease (DJD), left knee medial meniscus tear.  POSTOPERATIVE DIAGNOSES:  Degenerative joint disease (DJD), left knee medial meniscus tear, lateral meniscal tear, chondromalacia patella.  PROCEDURE PERFORMED: 1. Left knee arthroscopy. 2. Partial medial meniscectomy. 3. Chondroplasty medial femoral condyle, patella, partial lateral     meniscectomy.  ANESTHESIA:  General.  ASSISTANT:  None.  HISTORY:  __________ locking, popping, and giving way pain, indicated for arthroscopic debridement, failing conservative treatment.  Risks and benefits discussed including bleeding, infection, damage to neurovascular structures, DVT, PE, and anesthetic complications, etc.  TECHNIQUE:  With the patient in supine position, after induction of adequate general anesthesia, 2 g of Kefzol, the left lower extremity was prepped and draped in usual sterile fashion.  A lateral parapatellar portal was fashioned with a #11 blade.  Ingress cannula atraumatically placed.  Irrigant was utilized to insufflate the joint.  Copious portions of clear synovial fluid with cartilaginous fragments were evacuated.  Under direct visualization, a medial parapatellar portal was fashioned with a #11 blade after localization with 18-gauge needle sparing the medial meniscus.  There was an extensive tearing at the medial meniscus, extensive grade 3 changes of femoral condyle, tibial plateau.  There was some minor grade 4 changes beneath the meniscus and the tibial plateau in the midportion medially.  Introduced a Art gallery manager and resected it to a stable base.  Complex  tearing of the posterior half using an ArthroWand and further contoured with a remnant and in the red zone between the junction of the middle and posterior third, we used a coagulation.  The remnant was stable to probe palpation.  ACL was unremarkable.  Lateral compartment revealed a degenerative radial tear lateral meniscus.  This was amenable to shaving with 3.5 Cuda shaver to a stable base.  The femoral condyle and tibial plateau was unremarkable.  The remnant was stable to probe palpation.  Suprapatellar pouch revealed extensive grade 3 changes of the patella. Light chondroplasty performed here.  Normal patellofemoral tracking. Gutters were unremarkable.  I revisited all compartments.  No further pathology amenable to arthroscopic intervention.  I, therefore, removed all instrumentation.  Portals were closed with 4-0 nylon simple sutures, 0.25% Marcaine with epinephrine was infiltrated in the joint.  Wound was dressed sterilely.  Awoken without difficulty and transported to the recovery in satisfactory condition.  The patient tolerated the procedure well.  No complications.  No assistant.  Minimal blood loss.  His arthroscopic pressure was 65 mmHg.     Susa Day, M.D.     Geralynn Rile  D:  03/28/2014  T:  03/29/2014  Job:  [id number]

## 2014-05-19 ENCOUNTER — Other Ambulatory Visit: Payer: Self-pay

## 2014-05-19 MED ORDER — DABIGATRAN ETEXILATE MESYLATE 150 MG PO CAPS: 150.0000 mg | ORAL_CAPSULE | Freq: Two times a day (BID) | ORAL | Status: DC

## 2014-07-30 ENCOUNTER — Telehealth: Payer: Self-pay | Admitting: Cardiology

## 2014-08-01 NOTE — Telephone Encounter (Signed)
Closed encounter °

## 2014-08-12 ENCOUNTER — Ambulatory Visit: Payer: 59 | Admitting: Internal Medicine

## 2014-09-15 ENCOUNTER — Encounter: Payer: Self-pay | Admitting: Internal Medicine

## 2014-09-15 ENCOUNTER — Ambulatory Visit (INDEPENDENT_AMBULATORY_CARE_PROVIDER_SITE_OTHER): Payer: 59 | Admitting: Internal Medicine

## 2014-09-15 VITALS — BP 144/94 | HR 96 | Ht 71.75 in | Wt 288.2 lb

## 2014-09-15 DIAGNOSIS — Z7901 Long term (current) use of anticoagulants: Secondary | ICD-10-CM

## 2014-09-15 DIAGNOSIS — Z86010 Personal history of colonic polyps: Secondary | ICD-10-CM

## 2014-09-15 DIAGNOSIS — K219 Gastro-esophageal reflux disease without esophagitis: Secondary | ICD-10-CM

## 2014-09-15 DIAGNOSIS — R195 Other fecal abnormalities: Secondary | ICD-10-CM

## 2014-09-15 MED ORDER — MOVIPREP 100 G PO SOLR: 1.0000 | Freq: Once | ORAL | Status: DC

## 2014-09-15 NOTE — Progress Notes (Signed)
HISTORY OF PRESENT ILLNESS:  Dan [name] is a 63 y.o. male with multiple medical problems including hypertension, hyperlipidemia, morbid obesity, sleep apnea, diabetes mellitus, and chronic atrial fibrillation for which she is on Pradaxa. Patient is sent today by Dr. Reynaldo [name] regarding Hemoccult-positive stool identified on routine annual evaluation. The patient was last seen February 2012 for surveillance colonoscopy. He has a history of multiple adenomas in 2005 and 2008. Mother with a history of colorectal cancer at advanced age. The most recent examination revealed 2 adenomas and moderate left-sided diverticulosis. Routine follow-up in 5 years recommended. Patient does notice occasional bleeding with hemorrhoids. He denies melena. GI review of systems is remarkable for intermittent pyrosis for which she takes Tums. No dysphagia or abdominal pain. No weight loss. He denies NSAID use or aspirin. Review of outside records confirms Hemoccult-positive stool. As well, review of blood work shows normal hemoglobin of 14.5 with normal MCV 87.9. We did hold Pradaxa for his last colonoscopy after conferring with his cardiologist, Dr. Martinique  REVIEW OF SYSTEMS:  All non-GI ROS negative except for sinus and allergy trouble, irregular heart rate  Past Medical History  Diagnosis Date  . Chronic atrial fibrillation   . Hypertension   . Hyperlipidemia   . Morbid obesity   . BPH (benign prostatic hypertrophy)   . Diabetes mellitus   . Sleep apnea     CPAP nightly  . Colon polyps     adenomatous  . Diverticulosis   . Hemorrhoids     Past Surgical History  Procedure Laterality Date  . US echocardiography  08/08/2006    ef 55-60%  . Knee arthroscopy Left 03/28/2014    Procedure: LEFT KNEE ARTHROSCOPY WITH DEBRIDEMENT  ;  Surgeon: Dan Hai, MD;  Location: [facility] [place];  Service: Orthopedics;  Laterality: Left;    Social History Dan [name]  reports that he quit smoking  about 23 years ago. He does not have any smokeless tobacco history on file. He reports that he drinks alcohol. He reports that he does not use illicit drugs.  family history includes Heart attack in his father.  Allergies  Allergen Reactions  . Macadamia Nut Oil        PHYSICAL EXAMINATION: Vital signs: BP 144/94 mmHg  Pulse 96  Ht 5' 11.75" (1.822 m)  Wt 288 lb 4 oz (130.749 kg)  BMI 39.39 kg/m2  Constitutional: Pleasant, obese, otherwise generally well-appearing, no acute distress Psychiatric: alert and oriented x3, cooperative Eyes: extraocular movements intact, anicteric, conjunctiva pink Mouth: oral pharynx moist, no lesions Neck: supple no lymphadenopathy Cardiovascular: heart regular rate and rhythm with intermittent irregular beat, no murmur Lungs: clear to auscultation bilaterally Abdomen: soft, obese, nontender, nondistended, no obvious ascites, no peritoneal signs, normal bowel sounds, no organomegaly Rectal: Deferred until colonoscopy Extremities: 1+ lower extremity edema bilaterally Skin: no lesions on visible extremities Neuro: No focal deficits. No asterixis.    ASSESSMENT:  #1. Hemoccult-positive stool. Rule out occult GI mucosal lesion #2. GERD. Moderately frequent symptoms requiring antacids #3. History of adenomatous colon polyps. Family history of colon cancer. Last colonoscopy February 2012 with 2 adenomas. Was due for routine follow-up around February 2017. #4. Multiple medical problems. Stable. #5. Chronic anticoagulation   PLAN:  #1. Reflux precautions #2. The benefits from regular PPI to control reflux symptoms as well as prophylax against upper GI bleeding in a 63 year old with multiple medical problems on Pradaxa #3. Colonoscopy to evaluate Hemoccult-positive stool. Survey for recurrent polyps as well.  The patient is high risk.The nature of the procedure, as well as the risks, benefits, and alternatives were carefully and thoroughly reviewed  with the patient. Ample time for discussion and questions allowed. The patient understood, was satisfied, and agreed to proceed. #4. Upper endoscopy to evaluate Hemoccult-positive stool and reflux symptoms. The patient is high-risk.The nature of the procedure, as well as the risks, benefits, and alternatives were carefully and thoroughly reviewed with the patient. Ample time for discussion and questions allowed. The patient understood, was satisfied, and agreed to proceed. #5. Consult with the patient's cardiologist, Dr. Martinique, about holding Pradaxa 3 days prior to procedure work.

## 2014-09-15 NOTE — Patient Instructions (Signed)

## 2014-09-17 ENCOUNTER — Telehealth: Payer: Self-pay

## 2014-09-17 NOTE — Telephone Encounter (Signed)
Faxed note to Charlett Lango at [phone] per Dr. Martinique may hold Pardaxa for 48 hrs prior to colonoscopy.

## 2014-09-17 NOTE — Telephone Encounter (Signed)
He may hold Pradaxa for 48 hours prior to colonoscopy.  Tobin Witucki Martinique MD, [facility]

## 2014-09-17 NOTE — Telephone Encounter (Signed)
  09/17/2014   RE: Dan [name] DOB: 03/29/1951 MRN: [id number]   Dear Dr. Martinique,    We have scheduled the above patient for an endoscopic procedure. Our records show that he is on anticoagulation therapy.   Please advise as to how long the patient may come off his therapy of Pradaxa prior to the procedure, which is scheduled for 11/05/2014.  Please fax back/ or route the completed form to [place] at [phone]    Sincerely,    Phillis Haggis

## 2014-09-18 NOTE — Telephone Encounter (Signed)
Advised patient that per Dr Martinique, he may hold Pradaxa 48 hours prior to his colonoscopy. He verbalizes understanding.

## 2014-10-10 ENCOUNTER — Encounter: Payer: Self-pay | Admitting: Cardiology

## 2014-10-10 ENCOUNTER — Ambulatory Visit (INDEPENDENT_AMBULATORY_CARE_PROVIDER_SITE_OTHER): Payer: 59 | Admitting: Cardiology

## 2014-10-10 VITALS — BP 126/80 | HR 89 | Ht 73.0 in | Wt 285.4 lb

## 2014-10-10 DIAGNOSIS — I482 Chronic atrial fibrillation, unspecified: Secondary | ICD-10-CM

## 2014-10-10 DIAGNOSIS — I10 Essential (primary) hypertension: Secondary | ICD-10-CM

## 2014-10-10 DIAGNOSIS — E785 Hyperlipidemia, unspecified: Secondary | ICD-10-CM

## 2014-10-10 NOTE — Patient Instructions (Signed)
Continue your current therapy  Come off Pradaxa for 3 days prior to colonoscopy  I will see you in one year.

## 2014-10-10 NOTE — Progress Notes (Signed)
Dan [name] Date of Birth: 1951-06-19   History of Present Illness [name] is seen today for follow up of atrial fibrillation. He has a history of chronic atrial fibrillation that has been managed with rate control and anticoagulation. He remains asymptomatic. He denies any chest pain, shortness of breath, or palpitations. His activity is limited by knee pain. He had arthroscopic surgery but this did not help. He does have a bike now and is going to try it out. He reports that his diabetes has been under good control with the last A1c of 5.9%. He had a stress Myoview in August 2014 which was low risk with a small fixed apical defect. No ischemia.   Current Outpatient Prescriptions on File Prior to Visit  Medication Sig Dispense Refill  . amLODipine (NORVASC) 10 MG tablet Take 10 mg by mouth daily.      . dabigatran (PRADAXA) 150 MG CAPS capsule Take 1 capsule (150 mg total) by mouth every 12 (twelve) hours. 180 capsule 0  . ezetimibe-simvastatin (VYTORIN) 10-20 MG per tablet Take 1 tablet by mouth at bedtime.      Marland Kitchen lisinopril (PRINIVIL,ZESTRIL) 40 MG tablet Take 40 mg by mouth daily.      . metFORMIN (GLUCOPHAGE) 1000 MG tablet Take 1 tablet by mouth Twice daily.    . Nebivolol HCl (BYSTOLIC) 20 MG TABS Take 40 mg by mouth daily.      Marland Kitchen oxyCODONE-acetaminophen (PERCOCET) 5-325 MG per tablet Take 1 tablet by mouth every 4 (four) hours as needed. 40 tablet 0  . triamterene-hydrochlorothiazide (DYAZIDE) 37.5-25 MG per capsule Take 1 capsule by mouth every morning.       No current facility-administered medications on file prior to visit.    Allergies  Allergen Reactions  . Macadamia Nut Oil     Past Medical History  Diagnosis Date  . Chronic atrial fibrillation   . Hypertension   . Hyperlipidemia   . Morbid obesity   . BPH (benign prostatic hypertrophy)   . Diabetes mellitus   . Sleep apnea     CPAP nightly  . Colon polyps     adenomatous  . Diverticulosis   .  Hemorrhoids     Past Surgical History  Procedure Laterality Date  . US echocardiography  08/08/2006    ef 55-60%  . Knee arthroscopy Left 03/28/2014    Procedure: LEFT KNEE ARTHROSCOPY WITH DEBRIDEMENT  ;  Surgeon: Johnn Hai, MD;  Location: [facility];  Service: Orthopedics;  Laterality: Left;    History  Smoking status  . Former Smoker  . Quit date: 06/20/1991  Smokeless tobacco  . Not on file    History  Alcohol Use  . Yes    Comment: social    Family History  Problem Relation Age of Onset  . Heart attack Father     Review of Systems: As per history of present illness.  All other systems were reviewed and are negative.  Physical Exam: BP 126/80 mmHg  Pulse 89  Ht 6' 1"  (1.854 m)  Wt 285 lb 6.4 oz (129.457 kg)  BMI 37.66 kg/m2 He is an obese white male in no distress.   The HEENT exam is normal.  The carotids are 2+ without bruits.  There is no thyromegaly.  There is no JVD.  The lungs are clear.    The heart exam reveals an irregular rate with a normal S1 and S2.  There are no murmurs, gallops, or rubs.  The PMI is not displaced.   Abdominal exam reveals good bowel sounds.    There are no masses.  Exam of the legs reveal no clubbing, cyanosis, or edema.  The distal pulses are intact.  Cranial nerves II - XII are intact.  Motor and sensory functions are intact.  The gait is normal.  LABORATORY DATA: ECG today demonstrates atrial fibrillation with a controlled ventricular response of 89 beats per minute. It is otherwise normal. I have personally reviewed and interpreted this study.   Assessment / Plan: 1. Permanent atrial fibrillation. Rate is well controlled and he is anticoagulated with Pradaxa. He is asymptomatic. We will continue with his current therapy.  2. Low risk Myoview in 2014 without ischemia.  3. Hypertension-controlled  4. Diabetes mellitus type 2-controlled.  5. Hyperlipidemia on Vytorin. Labs followed by Dr. Reynaldo Minium.  6. Heme  positive stool. Plan to have upper and lower endoscopy in February. Will hold Pradaxa 3 days prior to study.

## 2014-11-02 ENCOUNTER — Emergency Department (HOSPITAL_BASED_OUTPATIENT_CLINIC_OR_DEPARTMENT_OTHER): Payer: 59

## 2014-11-02 ENCOUNTER — Encounter (HOSPITAL_BASED_OUTPATIENT_CLINIC_OR_DEPARTMENT_OTHER): Payer: Self-pay | Admitting: *Deleted

## 2014-11-02 ENCOUNTER — Inpatient Hospital Stay (HOSPITAL_BASED_OUTPATIENT_CLINIC_OR_DEPARTMENT_OTHER)
Admission: EM | Admit: 2014-11-02 | Discharge: 2014-11-04 | DRG: 193 | Disposition: A | Discharge status: Home / Self Care | Payer: 59 | Attending: Internal Medicine | Admitting: Internal Medicine

## 2014-11-02 DIAGNOSIS — N400 Enlarged prostate without lower urinary tract symptoms: Secondary | ICD-10-CM | POA: Diagnosis present

## 2014-11-02 DIAGNOSIS — Z87891 Personal history of nicotine dependence: Secondary | ICD-10-CM

## 2014-11-02 DIAGNOSIS — Z79899 Other long term (current) drug therapy: Secondary | ICD-10-CM

## 2014-11-02 DIAGNOSIS — R0603 Acute respiratory distress: Secondary | ICD-10-CM

## 2014-11-02 DIAGNOSIS — I700 Atherosclerosis of aorta: Secondary | ICD-10-CM | POA: Diagnosis present

## 2014-11-02 DIAGNOSIS — I5030 Unspecified diastolic (congestive) heart failure: Secondary | ICD-10-CM | POA: Diagnosis present

## 2014-11-02 DIAGNOSIS — J189 Pneumonia, unspecified organism: Secondary | ICD-10-CM | POA: Diagnosis present

## 2014-11-02 DIAGNOSIS — Z7901 Long term (current) use of anticoagulants: Secondary | ICD-10-CM

## 2014-11-02 DIAGNOSIS — I10 Essential (primary) hypertension: Secondary | ICD-10-CM | POA: Diagnosis present

## 2014-11-02 DIAGNOSIS — R0609 Other forms of dyspnea: Secondary | ICD-10-CM | POA: Diagnosis not present

## 2014-11-02 DIAGNOSIS — J9801 Acute bronchospasm: Secondary | ICD-10-CM | POA: Diagnosis present

## 2014-11-02 DIAGNOSIS — Z6836 Body mass index (BMI) 36.0-36.9, adult: Secondary | ICD-10-CM

## 2014-11-02 DIAGNOSIS — G4733 Obstructive sleep apnea (adult) (pediatric): Secondary | ICD-10-CM | POA: Diagnosis present

## 2014-11-02 DIAGNOSIS — I482 Chronic atrial fibrillation: Secondary | ICD-10-CM | POA: Diagnosis present

## 2014-11-02 DIAGNOSIS — J9691 Respiratory failure, unspecified with hypoxia: Secondary | ICD-10-CM | POA: Diagnosis present

## 2014-11-02 DIAGNOSIS — E1169 Type 2 diabetes mellitus with other specified complication: Secondary | ICD-10-CM | POA: Diagnosis present

## 2014-11-02 DIAGNOSIS — R0902 Hypoxemia: Secondary | ICD-10-CM

## 2014-11-02 DIAGNOSIS — Z8249 Family history of ischemic heart disease and other diseases of the circulatory system: Secondary | ICD-10-CM

## 2014-11-02 DIAGNOSIS — I4891 Unspecified atrial fibrillation: Secondary | ICD-10-CM

## 2014-11-02 DIAGNOSIS — E785 Hyperlipidemia, unspecified: Secondary | ICD-10-CM | POA: Diagnosis present

## 2014-11-02 LAB — BASIC METABOLIC PANEL
ANION GAP: 6 (ref 5–15)
BUN: 9 mg/dL (ref 6–23)
CO2: 26 mmol/L (ref 19–32)
Calcium: 9.2 mg/dL (ref 8.4–10.5)
Chloride: 102 mmol/L (ref 96–112)
Creatinine, Ser: 0.75 mg/dL (ref 0.50–1.35)
GFR calc Af Amer: >90 mL/min (ref >90)
GFR calc non Af Amer: >90 mL/min (ref >90)
Glucose, Bld: 174 mg/dL — ABNORMAL HIGH (ref 70–99)
Potassium: 3.4 mmol/L — ABNORMAL LOW (ref 3.5–5.1)
Sodium: 134 mmol/L — ABNORMAL LOW (ref 135–145)

## 2014-11-02 LAB — TROPONIN I
Troponin I: <0.03 ng/mL (ref <0.031)
Troponin I: <0.03 ng/mL (ref <0.031)

## 2014-11-02 LAB — CBC
HCT: 43.8 % (ref 39.0–52.0)
Hemoglobin: 14.7 g/dL (ref 13.0–17.0)
MCH: 28.6 pg (ref 26.0–34.0)
MCHC: 33.6 g/dL (ref 30.0–36.0)
MCV: 85.2 fL (ref 78.0–100.0)
PLATELETS: 158 10*3/uL (ref 150–400)
RBC: 5.14 MIL/uL (ref 4.22–5.81)
RDW: 13.7 % (ref 11.5–15.5)
WBC: 5.6 10*3/uL (ref 4.0–10.5)

## 2014-11-02 LAB — I-STAT ARTERIAL BLOOD GAS, ED
Acid-base deficit: 1 mmol/L (ref 0.0–2.0)
Bicarbonate: 24.4 mEq/L — ABNORMAL HIGH (ref 20.0–24.0)
O2 Saturation: 84 %
PH ART: 7.385 (ref 7.350–7.450)
TCO2: 26 mmol/L (ref 0–100)
pCO2 arterial: 40.7 mmHg (ref 35.0–45.0)
pO2, Arterial: 50 mmHg — ABNORMAL LOW (ref 80.0–100.0)

## 2014-11-02 LAB — MRSA PCR SCREENING: MRSA BY PCR: NEGATIVE

## 2014-11-02 LAB — GLUCOSE, CAPILLARY
GLUCOSE-CAPILLARY: 291 mg/dL — AB (ref 70–99)
Glucose-Capillary: 268 mg/dL — ABNORMAL HIGH (ref 70–99)

## 2014-11-02 LAB — BRAIN NATRIURETIC PEPTIDE: B Natriuretic Peptide: 229.9 pg/mL — ABNORMAL HIGH (ref 0.0–100.0)

## 2014-11-02 MED ORDER — LISINOPRIL 40 MG PO TABS
40.0000 mg | ORAL_TABLET | Freq: Every day | ORAL | Status: DC
  Administered 2014-11-02 – 2014-11-04 (×3): 40 mg via ORAL
  Filled 2014-11-02 (×3): qty 1

## 2014-11-02 MED ORDER — AMLODIPINE BESYLATE 10 MG PO TABS
10.0000 mg | ORAL_TABLET | Freq: Every day | ORAL | Status: DC
  Administered 2014-11-02 – 2014-11-04 (×3): 10 mg via ORAL
  Filled 2014-11-02 (×3): qty 1

## 2014-11-02 MED ORDER — SODIUM CHLORIDE 0.9 % IJ SOLN
3.0000 mL | Freq: Two times a day (BID) | INTRAMUSCULAR | Status: DC
  Administered 2014-11-02: 3 mL via INTRAVENOUS

## 2014-11-02 MED ORDER — FUROSEMIDE 10 MG/ML IJ SOLN
40.0000 mg | Freq: Once | INTRAMUSCULAR | Status: AC
  Administered 2014-11-02: 40 mg via INTRAVENOUS
  Filled 2014-11-02: qty 4

## 2014-11-02 MED ORDER — HYDROCODONE-ACETAMINOPHEN 5-325 MG PO TABS: 1.0000 | ORAL_TABLET | ORAL | Status: DC | PRN

## 2014-11-02 MED ORDER — CEFTRIAXONE SODIUM IN DEXTROSE 20 MG/ML IV SOLN
1.0000 g | INTRAVENOUS | Status: DC
  Administered 2014-11-03: 1 g via INTRAVENOUS
  Filled 2014-11-02 (×2): qty 50

## 2014-11-02 MED ORDER — IOHEXOL 350 MG/ML SOLN
125.0000 mL | Freq: Once | INTRAVENOUS | Status: AC | PRN
  Administered 2014-11-02: 125 mL via INTRAVENOUS

## 2014-11-02 MED ORDER — IPRATROPIUM-ALBUTEROL 0.5-2.5 (3) MG/3ML IN SOLN
RESPIRATORY_TRACT | Status: AC
  Administered 2014-11-02: 3 mL via RESPIRATORY_TRACT
  Filled 2014-11-02: qty 3

## 2014-11-02 MED ORDER — ACETAMINOPHEN 500 MG PO TABS: 500.0000 mg | ORAL_TABLET | Freq: Four times a day (QID) | ORAL | Status: DC | PRN

## 2014-11-02 MED ORDER — DEXTROSE 5 % IV SOLN
500.0000 mg | INTRAVENOUS | Status: DC
  Administered 2014-11-03: 500 mg via INTRAVENOUS
  Filled 2014-11-02 (×2): qty 500

## 2014-11-02 MED ORDER — ZOLPIDEM TARTRATE 5 MG PO TABS: 5.0000 mg | ORAL_TABLET | Freq: Every evening | ORAL | Status: DC | PRN

## 2014-11-02 MED ORDER — PREDNISONE 50 MG PO TABS
60.0000 mg | ORAL_TABLET | Freq: Once | ORAL | Status: AC
  Administered 2014-11-02: 60 mg via ORAL
  Filled 2014-11-02 (×2): qty 1

## 2014-11-02 MED ORDER — IPRATROPIUM-ALBUTEROL 0.5-2.5 (3) MG/3ML IN SOLN
3.0000 mL | Freq: Once | RESPIRATORY_TRACT | Status: AC
  Administered 2014-11-02: 3 mL via RESPIRATORY_TRACT
  Filled 2014-11-02: qty 3

## 2014-11-02 MED ORDER — NEBIVOLOL HCL 5 MG PO TABS
40.0000 mg | ORAL_TABLET | Freq: Every day | ORAL | Status: DC
  Administered 2014-11-02 – 2014-11-04 (×3): 40 mg via ORAL
  Filled 2014-11-02: qty 8
  Filled 2014-11-02 (×2): qty 4

## 2014-11-02 MED ORDER — SODIUM CHLORIDE 0.9 % IV SOLN: 250.0000 mL | INTRAVENOUS | Status: DC | PRN

## 2014-11-02 MED ORDER — INSULIN ASPART 100 UNIT/ML ~~LOC~~ SOLN
0.0000 [IU] | Freq: Three times a day (TID) | SUBCUTANEOUS | Status: DC
  Administered 2014-11-03: 3 [IU] via SUBCUTANEOUS
  Administered 2014-11-03: 5 [IU] via SUBCUTANEOUS
  Administered 2014-11-03: 3 [IU] via SUBCUTANEOUS
  Administered 2014-11-04: 11 [IU] via SUBCUTANEOUS

## 2014-11-02 MED ORDER — GUAIFENESIN ER 600 MG PO TB12
600.0000 mg | ORAL_TABLET | Freq: Two times a day (BID) | ORAL | Status: DC | PRN
  Filled 2014-11-02: qty 1

## 2014-11-02 MED ORDER — DEXTROSE 5 % IV SOLN
1.0000 g | Freq: Once | INTRAVENOUS | Status: AC
  Administered 2014-11-02: 1 g via INTRAVENOUS

## 2014-11-02 MED ORDER — DABIGATRAN ETEXILATE MESYLATE 150 MG PO CAPS
150.0000 mg | ORAL_CAPSULE | Freq: Two times a day (BID) | ORAL | Status: DC
  Administered 2014-11-02 – 2014-11-04 (×4): 150 mg via ORAL
  Filled 2014-11-02 (×6): qty 1

## 2014-11-02 MED ORDER — TRIAMTERENE-HCTZ 37.5-25 MG PO CAPS
1.0000 | ORAL_CAPSULE | Freq: Every day | ORAL | Status: DC
  Administered 2014-11-03 – 2014-11-04 (×2): 1 via ORAL
  Filled 2014-11-02 (×2): qty 1

## 2014-11-02 MED ORDER — EZETIMIBE-SIMVASTATIN 10-20 MG PO TABS
1.0000 | ORAL_TABLET | Freq: Every day | ORAL | Status: DC
Start: 2014-11-02 — End: 2014-11-04
  Administered 2014-11-02 – 2014-11-03 (×2): 1 via ORAL
  Filled 2014-11-02 (×4): qty 1

## 2014-11-02 MED ORDER — IPRATROPIUM-ALBUTEROL 0.5-2.5 (3) MG/3ML IN SOLN: 3.0000 mL | RESPIRATORY_TRACT | Status: DC

## 2014-11-02 MED ORDER — INSULIN ASPART 100 UNIT/ML ~~LOC~~ SOLN
0.0000 [IU] | Freq: Every day | SUBCUTANEOUS | Status: DC
  Administered 2014-11-02: 3 [IU] via SUBCUTANEOUS

## 2014-11-02 MED ORDER — IPRATROPIUM-ALBUTEROL 0.5-2.5 (3) MG/3ML IN SOLN
3.0000 mL | RESPIRATORY_TRACT | Status: DC
  Administered 2014-11-02 (×3): 3 mL via RESPIRATORY_TRACT
  Filled 2014-11-02 (×2): qty 3

## 2014-11-02 MED ORDER — CETYLPYRIDINIUM CHLORIDE 0.05 % MT LIQD
7.0000 mL | Freq: Two times a day (BID) | OROMUCOSAL | Status: DC
  Administered 2014-11-02 – 2014-11-03 (×2): 7 mL via OROMUCOSAL

## 2014-11-02 MED ORDER — SODIUM CHLORIDE 0.9 % IJ SOLN: 3.0000 mL | INTRAMUSCULAR | Status: DC | PRN

## 2014-11-02 MED ORDER — FLUTICASONE PROPIONATE 50 MCG/ACT NA SUSP
2.0000 | Freq: Every day | NASAL | Status: DC
  Filled 2014-11-02: qty 16

## 2014-11-02 MED ORDER — CEFTRIAXONE SODIUM 1 G IJ SOLR
INTRAMUSCULAR | Status: AC
  Filled 2014-11-02: qty 10

## 2014-11-02 MED ORDER — DEXTROSE 5 % IV SOLN
500.0000 mg | Freq: Once | INTRAVENOUS | Status: AC
  Administered 2014-11-02: 500 mg via INTRAVENOUS
  Filled 2014-11-02: qty 500

## 2014-11-02 NOTE — ED Provider Notes (Addendum)
CSN: [id number]     Arrival date & time 11/02/14  1443 History   First MD Initiated Contact with Patient 11/02/14 [phone]     Chief Complaint  Patient presents with  . Cough     (Consider location/radiation/quality/duration/timing/severity/associated sxs/prior Treatment) Patient is a 64 y.o. male presenting with cough. The history is provided by the patient.  Cough Cough characteristics:  Productive Sputum characteristics:  Clear Severity:  Mild Onset quality:  Gradual Timing:  Constant Progression:  Unchanged Chronicity:  New Smoker: no   Context: upper respiratory infection   Context: not sick contacts and not weather changes   Relieved by: Mucinex. Worsened by:  Nothing tried Associated symptoms: rhinorrhea and sinus congestion   Associated symptoms: no chills, no fever, no rash and no shortness of breath     Past Medical History  Diagnosis Date  . Chronic atrial fibrillation   . Hypertension   . Hyperlipidemia   . Morbid obesity   . BPH (benign prostatic hypertrophy)   . Diabetes mellitus   . Sleep apnea     CPAP nightly  . Colon polyps     adenomatous  . Diverticulosis   . Hemorrhoids    Past Surgical History  Procedure Laterality Date  . US echocardiography  08/08/2006    ef 55-60%  . Knee arthroscopy Left 03/28/2014    Procedure: LEFT KNEE ARTHROSCOPY WITH DEBRIDEMENT  ;  Surgeon: Johnn Hai, MD;  [facility];  Service: Orthopedics;  Laterality: Left;   Family History  Problem Relation Age of Onset  . Heart attack Father    History  Substance Use Topics  . Smoking status: Former Smoker    Quit date: 06/20/1991  . Smokeless tobacco: Not on file  . Alcohol Use: Yes     Comment: social    Review of Systems  Constitutional: Negative for fever and chills.  HENT: Positive for rhinorrhea.   Respiratory: Positive for cough (productive of small amount of mucus). Negative for shortness of breath.   Gastrointestinal: Negative for  vomiting and abdominal pain.  Skin: Negative for rash.  All other systems reviewed and are negative.     Allergies  Macadamia nut oil  Home Medications   Prior to Admission medications   Medication Sig Start Date End Date Taking? Authorizing Provider  amLODipine (NORVASC) 10 MG tablet Take 10 mg by mouth daily.      Historical Provider, MD  dabigatran (PRADAXA) 150 MG CAPS capsule Take 1 capsule (150 mg total) by mouth every 12 (twelve) hours. 05/19/14   Peter M Martinique, MD  ezetimibe-simvastatin (VYTORIN) 10-20 MG per tablet Take 1 tablet by mouth at bedtime.      Historical Provider, MD  lisinopril (PRINIVIL,ZESTRIL) 40 MG tablet Take 40 mg by mouth daily.      Historical Provider, MD  metFORMIN (GLUCOPHAGE) 1000 MG tablet Take 1 tablet by mouth Twice daily. 04/07/11   Historical Provider, MD  Nebivolol HCl (BYSTOLIC) 20 MG TABS Take 40 mg by mouth daily.      Historical Provider, MD  oxyCODONE-acetaminophen (PERCOCET) 5-325 MG per tablet Take 1 tablet by mouth every 4 (four) hours as needed. 03/28/14   Johnn Hai, MD  triamterene-hydrochlorothiazide (DYAZIDE) 37.5-25 MG per capsule Take 1 capsule by mouth every morning.      Historical Provider, MD   BP 161/75 mmHg  Pulse 100  Temp(Src) 99.4 F (37.4 C) (Oral)  Resp 20  Ht 6\' 1"  (1.854 m)  Wt  265 lb (120.203 kg)  BMI 34.97 kg/m2  SpO2 90% Physical Exam  Constitutional: He is oriented to person, place, and time. He appears well-developed and well-nourished. No distress.  HENT:  Head: Normocephalic and atraumatic.  Mouth/Throat: No oropharyngeal exudate.  Eyes: EOM are normal. Pupils are equal, round, and reactive to light.  Neck: Normal range of motion. Neck supple.  Cardiovascular: Regular rhythm.  Tachycardia present.  Exam reveals no friction rub.   No murmur heard. Pulmonary/Chest: Effort normal and breath sounds normal. No respiratory distress. He has no wheezes. He has no rales.  Abdominal: He exhibits no distension.  There is no tenderness. There is no rebound.  Musculoskeletal: Normal range of motion. He exhibits no edema.  Neurological: He is alert and oriented to person, place, and time.  Skin: No rash noted. He is not diaphoretic.  Nursing note and vitals reviewed.   ED Course  Procedures (including critical care time) Labs Review Labs Reviewed - No data to display  Imaging Review Dg Chest 2 View  11/02/2014   CLINICAL DATA:  cough with sob on exertion since Friday. Hx of afib, htn and dm.  EXAM: CHEST - 2 VIEW  COMPARISON:  None available  FINDINGS: Mild cardiomegaly. Tortuous atheromatous aorta. Mild central pulmonary vascular congestion. No focal infiltrate. No effusion. Visualized skeletal structures are unremarkable.  IMPRESSION: 1. Cardiomegaly and mild central pulmonary vascular congestion.   Electronically Signed   By: Arne Cleveland M.D.   On: 11/02/2014 09:08   Ct Angio Chest Pe W/cm &/or Wo Cm  11/02/2014   CLINICAL DATA:  Cough with sinus congestion. History of atrial fibrillation.  EXAM: CT ANGIOGRAPHY CHEST WITH CONTRAST  TECHNIQUE: Multidetector CT imaging of the chest was performed using the standard protocol during bolus administration of intravenous contrast. Multiplanar CT image reconstructions and MIPs were obtained to evaluate the vascular anatomy.  CONTRAST:  169mL OMNIPAQUE IOHEXOL 350 MG/ML SOLN  COMPARISON:  Current chest radiograph  FINDINGS: No evidence of a pulmonary embolus.  Heart is mildly enlarged. There are moderate coronary artery calcifications. Thoracic aorta is normal in caliber. No dissection. Partly calcified plaque is noted along the thoracic aorta and its branch vessels. No significant stenosis.  There is shotty mediastinal and hilar adenopathy. Several nodes were measured. Node along the right margin of the posterior trachea and right aspect of the upper esophagus in the upper mediastinum measures 12 mm short axis. Right precarinal lymph node measures 18 mm in short  axis. Right subcarinal node measures 18 mm in short axis. Right hilar node measures 12 mm in short axis and a noted in the left hilum at this same axial level measures 11 mm in short axis.  Lungs show bilateral interstitial thickening. There is more prominent peribronchial thickening in the lower lobes and right middle lobe. There are few small areas of ground-glass opacity most evident in the right lower lobe. No pleural effusion.  Partial imaging of the upper abdomen shows evidence of fatty infiltration of the liver. There are no adrenal masses.  Minor degenerative changes of the thoracic spine. No osteoblastic or osteolytic lesions.  Review of the MIP images confirms the above findings.  IMPRESSION: 1. No evidence of a pulmonary embolus. 2. Lungs show interstitial thickening with more prominent peribronchial interstitial thickening in the lower lobes. This is most evident on right. There is small areas of associated ground-glass opacity. 3. Mild mediastinal and hilar adenopathy, presumed reactive. 4. Mild cardiomegaly. Combination of these findings could reflect mild  congestive heart failure. Bilateral interstitial infiltrates with acute bronchitis should be considered if this correlates clinically.   Electronically Signed   By: Lajean Manes M.D.   On: 11/02/2014 12:15     EKG Interpretation   Date/Time:  Sunday November 02 2014 12:58:25 EST Ventricular Rate:  114 PR Interval:    QRS Duration: 90 QT Interval:  338 QTC Calculation: 465 R Axis:   -11 Text Interpretation:  Atrial fibrillation with rapid ventricular response  Abnormal ECG Afib similar to prior Confirmed by Mingo Amber  MD, [place] ([id number])  on 11/02/2014 1:01:45 PM     CRITICAL CARE Performed by: Osvaldo Shipper   Total critical care time: 30 minutes  Critical care time was exclusive of separately billable procedures and treating other patients.  Critical care was necessary to treat or prevent imminent or life-threatening  deterioration.  Critical care was time spent personally by me on the following activities: development of treatment plan with patient and/or surrogate as well as nursing, discussions with consultants, evaluation of patient's response to treatment, examination of patient, obtaining history from patient or surrogate, ordering and performing treatments and interventions, ordering and review of laboratory studies, ordering and review of radiographic studies, pulse oximetry and re-evaluation of patient's condition.  MDM   Final diagnoses:  Community acquired pneumonia  Atrial fibrillation, unspecified  Acute respiratory distress  Hypoxia    5M here with cough, congestion. He thinks he has a sinus infection. He denies any SOB. Here relaxing, talking in complete sentences. On my initial exam, tachycardic in the low 100s, hypoxic down to 88% while talking that improved without oxygen. Non-smoker. Recent travel to [facility].  Lungs clear. HEENT exam benign.  Will obtain CXR. Patient's xray shows possible congestion. Patient placed on O2 for worsening O2 sats. Labs ordered. Labs show mild elevation in BNP (230). Remaining tachypneic, hypoxic despite a few breathing treatments. Will scan his chest. CT negative for PE, however shows infiltrate vs edema. I feel it is likely infiltrate with normal BNP, however given lasix in case of some fluid. Rocephin/Azithro ordered.  Patient's ABG shows hypoxia. Placed on BiPap to help open up his airways, doing well on BiPap, relaxing and texting comfortably. Dr. Dagmar Hait with [place] admitting to [facility]  I have reviewed all labs and imaging and considered them in my medical decision making.     Evelina Bucy, MD 11/02/14 [place], MD 11/02/14 1314

## 2014-11-02 NOTE — Progress Notes (Signed)
Dr Dagmar Hait paged to be informed of patient arrival to [facility]

## 2014-11-02 NOTE — H&P (Signed)
PCP:   Geoffery Lyons, MD   Chief Complaint:  Shortness of breath and wheezing  HPI: 64 year old morbidly obese gentleman with atrial fibrillation on anticoagulation, obstructive sleep apnea on see Pap machine, and history of recurrent sinusitis, treated by otolaryngology Dr. Wilburn Cornelia. Other issues include hyperlipidemia hypertension type 2 diabetes on oral agents recent FOBT positive scheduled for EGD and colonoscopy on Wednesday, history of low-risk Myoview stress test 2014, last echocardiogram with EF normal and 2007. However patient reports an echocardiogram approximately 2 weeks ago with Dr. Martinique however do not find this and the hospital chart. 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 He was recently in [facility] coming back to [place] this past Thursday. Over the weekend developed progressive sinus congestion, without fevers, chills, minor wheezing, but no significant limitations on ADLs, went to urgent care center this morning thinking he would ask for Z-Pak however they noted that he was hypoxic, and after initial presentation developed worsening wheezing respiratory distress increased work of breathing, CT scan negative for pulmonary embolism, positive for question of pulmonary edema versus I needs consistent with acute bronchitis. Patient states that he is never been treated with routine and regular use of inhalers, he is a former smoker. Patient was treated with nebulizer treatments with some improvement, in addition to one dose of diuretics based on CT scan findings-however hypoxia demonstrated on ABG, as well as the need for oxygen and actual temper use of BiPAP, given pulmonary distress, concerning findings on CT scan, and continued bronchospasm with hypoxia, patient transferred to stepdown unit for further evaluation and management. Patient was febrile to 99.4 at urgent care however  question of whether it went as high as 100 per patient report.  Review of Systems:  Negative for fevers or chills prior to urgent care evaluation, positive for fevers without chills at the urgent care, denies headaches, visual abnormalities swallowing difficulties, but does admit to significant sinus congestion with some purulent discharge, also admits to wheezing, shortness of breath as described, without chest pain, denies palpitations and indeed is been asymptomatic with respect to his history of atrial fibrillation, denies any bleeding complications on current anticoagulation however his FOBT positive and scheduled for EGD colonoscopy on Wednesday with Dr. Henrene Pastor. Denies abdominal pain, recurrent reflux, nausea, vomiting or change in bowel habits. Denies worsening lower extremity edema except after plane travel and denies acute issues with the joints positive for history of arthritis and knee discomfort.  Past Medical History: Past Medical History  Diagnosis Date  . Chronic atrial fibrillation   . Hypertension   . Hyperlipidemia   . Morbid obesity   . BPH (benign prostatic hypertrophy)   . Diabetes mellitus   . Sleep apnea     CPAP nightly  . Colon polyps     adenomatous  . Diverticulosis   . Hemorrhoids    Past Surgical History  Procedure Laterality Date  . US echocardiography  08/08/2006    ef 55-60%  . Knee arthroscopy Left 03/28/2014    Procedure: LEFT KNEE ARTHROSCOPY WITH DEBRIDEMENT  ;  Surgeon: Johnn Hai, MD;  Location: [facility];  Service: Orthopedics;  Laterality: Left;    Medications: Prior to Admission medications   Medication Sig Start Date End Date Taking? Authorizing Provider  acetaminophen (TYLENOL) 500 MG tablet Take 500-1,000 mg by mouth every 6 (six) hours as needed.   Yes Historical Provider, MD  amLODipine (  NORVASC) 10 MG tablet Take 10 mg by mouth daily.     Yes Historical Provider, MD  BYSTOLIC 10 MG tablet Take 40 mg by mouth daily.  10/08/14  Yes Historical Provider, MD  dabigatran (PRADAXA) 150 MG CAPS capsule Take 1 capsule (150 mg total) by mouth every 12 (twelve) hours. 05/19/14  Yes Peter M Martinique, MD  ezetimibe-simvastatin (VYTORIN) 10-20 MG per tablet Take 1 tablet by mouth at bedtime.     Yes Historical Provider, MD  fluticasone (FLONASE) 50 MCG/ACT nasal spray Place 2 sprays into both nostrils daily as needed. 10/08/14  Yes Historical Provider, MD  guaiFENesin (MUCINEX) 600 MG 12 hr tablet Take 1 mg by mouth 2 (two) times daily as needed for cough or to loosen phlegm.   Yes Historical Provider, MD  lisinopril (PRINIVIL,ZESTRIL) 40 MG tablet Take 40 mg by mouth daily.     Yes Historical Provider, MD  metFORMIN (GLUCOPHAGE) 1000 MG tablet Take 1 tablet by mouth 2 (two) times daily with a meal.  04/07/11  Yes Historical Provider, MD  triamterene-hydrochlorothiazide (DYAZIDE) 37.5-25 MG per capsule Take 1 capsule by mouth every morning.     Yes Historical Provider, MD  oxyCODONE-acetaminophen (PERCOCET) 5-325 MG per tablet Take 1 tablet by mouth every 4 (four) hours as needed. Patient not taking: Reported on 11/02/2014 03/28/14   Johnn Hai, MD    Allergies:   Allergies  Allergen Reactions  . Macadamia Nut Oil     Social History:  reports that he quit smoking about 23 years ago. He does not have any smokeless tobacco history on file. He reports that he drinks alcohol. He reports that he does not use illicit drugs.  Family History: Family History  Problem Relation Age of Onset  . Heart attack Father     Physical Exam: Filed Vitals:   11/02/14 1430 11/02/14 1600 11/02/14 1603 11/02/14 1719  BP: 134/79 159/105    Pulse: 42 123 139 128  Temp:   101.2 F (38.4 C) 99.2 F (37.3 C)  TempSrc:   Oral   Resp: 38 28 39 30  Height:      Weight:      SpO2: 89% 95% 95% 95%   Morbidly obese but no apparent distress talking in full sentences with oxygen by nasal cannula in place, conversant and pleasant Head exam  normocephalic atraumatic No oropharyngeal lesions tongue midline Sclera anicteric extraconal movements are intact Neck supple, no cervical lymphadenopathy Lungs reveal mild inspiratory wheezing and expiratory wheezing as well as Rales at the bases with no increased work of breathing Cardiovascular reveals tachycardic but irregular rhythm Soft, nontender, nondistended, bowel sounds present no guarding General exam reveals trace nonpitting edema, pedal pulses intact, no cyanosis Muscle skeletal reveals normal range of motion Skin warm and dry with no rash noted Neurologic exam alert and oriented 3, no focal neurologic deficit    Labs on Admission:   Recent Labs  11/02/14 0930  NA 134*  K 3.4*  CL 102  CO2 26  GLUCOSE 174*  BUN 9  CREATININE 0.75  CALCIUM 9.2   No results for input(s): AST, ALT, ALKPHOS, BILITOT, PROT, ALBUMIN in the last 72 hours. No results for input(s): LIPASE, AMYLASE in the last 72 hours.  Recent Labs  11/02/14 0930  WBC 5.6  HGB 14.7  HCT 43.8  MCV 85.2  PLT 158    Recent Labs  11/02/14 0930  TROPONINI <0.03   No results for input(s): TSH, T4TOTAL, T3FREE, THYROIDAB  in the last 72 hours.  Invalid input(s): FREET3 No results for input(s): VITAMINB12, FOLATE, FERRITIN, TIBC, IRON, RETICCTPCT in the last 72 hours.  Radiological Exams on Admission: Dg Chest 2 View  11/02/2014   CLINICAL DATA:  cough with sob on exertion since Friday. Hx of afib, htn and dm.  EXAM: CHEST - 2 VIEW  COMPARISON:  None available  FINDINGS: Mild cardiomegaly. Tortuous atheromatous aorta. Mild central pulmonary vascular congestion. No focal infiltrate. No effusion. Visualized skeletal structures are unremarkable.  IMPRESSION: 1. Cardiomegaly and mild central pulmonary vascular congestion.   Electronically Signed   By: Arne Cleveland M.D.   On: 11/02/2014 09:08   Ct Angio Chest Pe W/cm &/or Wo Cm  11/02/2014   CLINICAL DATA:  Cough with sinus congestion. History of  atrial fibrillation.  EXAM: CT ANGIOGRAPHY CHEST WITH CONTRAST  TECHNIQUE: Multidetector CT imaging of the chest was performed using the standard protocol during bolus administration of intravenous contrast. Multiplanar CT image reconstructions and MIPs were obtained to evaluate the vascular anatomy.  CONTRAST:  152mL OMNIPAQUE IOHEXOL 350 MG/ML SOLN  COMPARISON:  Current chest radiograph  FINDINGS: No evidence of a pulmonary embolus.  Heart is mildly enlarged. There are moderate coronary artery calcifications. Thoracic aorta is normal in caliber. No dissection. Partly calcified plaque is noted along the thoracic aorta and its branch vessels. No significant stenosis.  There is shotty mediastinal and hilar adenopathy. Several nodes were measured. Node along the right margin of the posterior trachea and right aspect of the upper esophagus in the upper mediastinum measures 12 mm short axis. Right precarinal lymph node measures 18 mm in short axis. Right subcarinal node measures 18 mm in short axis. Right hilar node measures 12 mm in short axis and a noted in the left hilum at this same axial level measures 11 mm in short axis.  Lungs show bilateral interstitial thickening. There is more prominent peribronchial thickening in the lower lobes and right middle lobe. There are few small areas of ground-glass opacity most evident in the right lower lobe. No pleural effusion.  Partial imaging of the upper abdomen shows evidence of fatty infiltration of the liver. There are no adrenal masses.  Minor degenerative changes of the thoracic spine. No osteoblastic or osteolytic lesions.  Review of the MIP images confirms the above findings.  IMPRESSION: 1. No evidence of a pulmonary embolus. 2. Lungs show interstitial thickening with more prominent peribronchial interstitial thickening in the lower lobes. This is most evident on right. There is small areas of associated ground-glass opacity. 3. Mild mediastinal and hilar adenopathy,  presumed reactive. 4. Mild cardiomegaly. Combination of these findings could reflect mild congestive heart failure. Bilateral interstitial infiltrates with acute bronchitis should be considered if this correlates clinically.   Electronically Signed   By: Lajean Manes M.D.   On: 11/02/2014 12:15   Orders placed or performed during the hospital encounter of 11/02/14  . EKG 12-Lead  . EKG 12-Lead  . EKG 12-Lead  . EKG 12-Lead   EKG with atrial fibrillation with RVR in the emergency room Assessment/Plan Active Problems:   Pneumonia versus acute bronchitis, pulmonary edema mild, status post administration of Lasix, and empiric initiation of Rocephin and azithromycin for community-acquired pneumonia in the emergency room. Hypoxia-related to pulmonary issues, doubt congestive heart failure however question rate related I stalk dysfunction, will try to control heart rate, historically well controlled. Will continue oxygen Atrial fibrillation with RVR, we'll reinitiate medications including selective beta blocker, will defer  nonselective beta blocker given bronchospasm, will watch for evidence of recurrent volume overload Bronchospasm with increased work of breathing, hypoxia, will initiate and continue nebulizer treatments given symptomatic relief occurring in the emergency room Type 2 diabetes we'll hold metformin, start sliding scale insulin Hypertension we'll reinitiate home medication regimen Hyperlipidemia we'll continue statin therapy Arthritis we'll continue home regimen for NSAIDs given anticoagulation Anticoagulation we'll continue factor X A inhibitors   Addylin Manke R 11/02/2014, 5:41 PM

## 2014-11-02 NOTE — ED Notes (Signed)
Pt placed in BIPAP

## 2014-11-02 NOTE — ED Notes (Signed)
Patient states that he has had a cough, nonproductive. Patient is SOB and labored on exertion.

## 2014-11-02 NOTE — ED Notes (Signed)
O2Sat 91% on 3 L [place]. Oxygen now increased to 4 L [place]. Pt denies SOB. MD informed and CT angiogram in process to rule out PE. NAD noted

## 2014-11-03 ENCOUNTER — Telehealth: Payer: Self-pay | Admitting: Internal Medicine

## 2014-11-03 DIAGNOSIS — I5030 Unspecified diastolic (congestive) heart failure: Secondary | ICD-10-CM | POA: Diagnosis present

## 2014-11-03 DIAGNOSIS — E1169 Type 2 diabetes mellitus with other specified complication: Secondary | ICD-10-CM | POA: Diagnosis present

## 2014-11-03 DIAGNOSIS — E785 Hyperlipidemia, unspecified: Secondary | ICD-10-CM | POA: Diagnosis present

## 2014-11-03 DIAGNOSIS — R0609 Other forms of dyspnea: Secondary | ICD-10-CM | POA: Diagnosis present

## 2014-11-03 DIAGNOSIS — Z8249 Family history of ischemic heart disease and other diseases of the circulatory system: Secondary | ICD-10-CM | POA: Diagnosis not present

## 2014-11-03 DIAGNOSIS — I482 Chronic atrial fibrillation: Secondary | ICD-10-CM | POA: Diagnosis present

## 2014-11-03 DIAGNOSIS — J9801 Acute bronchospasm: Secondary | ICD-10-CM | POA: Diagnosis present

## 2014-11-03 DIAGNOSIS — Z87891 Personal history of nicotine dependence: Secondary | ICD-10-CM | POA: Diagnosis not present

## 2014-11-03 DIAGNOSIS — J189 Pneumonia, unspecified organism: Secondary | ICD-10-CM | POA: Diagnosis present

## 2014-11-03 DIAGNOSIS — J9691 Respiratory failure, unspecified with hypoxia: Secondary | ICD-10-CM | POA: Diagnosis present

## 2014-11-03 DIAGNOSIS — I700 Atherosclerosis of aorta: Secondary | ICD-10-CM | POA: Diagnosis present

## 2014-11-03 DIAGNOSIS — I10 Essential (primary) hypertension: Secondary | ICD-10-CM | POA: Diagnosis present

## 2014-11-03 DIAGNOSIS — G4733 Obstructive sleep apnea (adult) (pediatric): Secondary | ICD-10-CM | POA: Diagnosis present

## 2014-11-03 DIAGNOSIS — N400 Enlarged prostate without lower urinary tract symptoms: Secondary | ICD-10-CM | POA: Diagnosis present

## 2014-11-03 DIAGNOSIS — Z79899 Other long term (current) drug therapy: Secondary | ICD-10-CM | POA: Diagnosis not present

## 2014-11-03 DIAGNOSIS — Z7901 Long term (current) use of anticoagulants: Secondary | ICD-10-CM | POA: Diagnosis not present

## 2014-11-03 DIAGNOSIS — Z6836 Body mass index (BMI) 36.0-36.9, adult: Secondary | ICD-10-CM | POA: Diagnosis not present

## 2014-11-03 LAB — CBC
HEMATOCRIT: 42.9 % (ref 39.0–52.0)
Hemoglobin: 14.6 g/dL (ref 13.0–17.0)
MCH: 28.8 pg (ref 26.0–34.0)
MCHC: 34 g/dL (ref 30.0–36.0)
MCV: 84.6 fL (ref 78.0–100.0)
Platelets: 156 10*3/uL (ref 150–400)
RBC: 5.07 MIL/uL (ref 4.22–5.81)
RDW: 13.7 % (ref 11.5–15.5)
WBC: 4.3 10*3/uL (ref 4.0–10.5)

## 2014-11-03 LAB — COMPREHENSIVE METABOLIC PANEL
ALT: 32 U/L (ref 0–53)
AST: 32 U/L (ref 0–37)
Albumin: 3.4 g/dL — ABNORMAL LOW (ref 3.5–5.2)
Alkaline Phosphatase: 52 U/L (ref 39–117)
Anion gap: 7 (ref 5–15)
BUN: 8 mg/dL (ref 6–23)
CO2: 33 mmol/L — AB (ref 19–32)
Calcium: 9.9 mg/dL (ref 8.4–10.5)
Chloride: 97 mmol/L (ref 96–112)
Creatinine, Ser: 0.84 mg/dL (ref 0.50–1.35)
GFR, EST NON AFRICAN AMERICAN: 90 mL/min — AB (ref >90)
Glucose, Bld: 161 mg/dL — ABNORMAL HIGH (ref 70–99)
POTASSIUM: 3.6 mmol/L (ref 3.5–5.1)
SODIUM: 137 mmol/L (ref 135–145)
Total Bilirubin: 1 mg/dL (ref 0.3–1.2)
Total Protein: 6.8 g/dL (ref 6.0–8.3)

## 2014-11-03 LAB — GLUCOSE, CAPILLARY
GLUCOSE-CAPILLARY: 208 mg/dL — AB (ref 70–99)
Glucose-Capillary: 158 mg/dL — ABNORMAL HIGH (ref 70–99)
Glucose-Capillary: 168 mg/dL — ABNORMAL HIGH (ref 70–99)

## 2014-11-03 LAB — TROPONIN I
Troponin I: <0.03 ng/mL (ref <0.031)
Troponin I: <0.03 ng/mL (ref <0.031)

## 2014-11-03 MED ORDER — IPRATROPIUM-ALBUTEROL 0.5-2.5 (3) MG/3ML IN SOLN
3.0000 mL | Freq: Three times a day (TID) | RESPIRATORY_TRACT | Status: DC
  Administered 2014-11-03 – 2014-11-04 (×3): 3 mL via RESPIRATORY_TRACT
  Filled 2014-11-03 (×4): qty 3

## 2014-11-03 MED ORDER — ALBUTEROL SULFATE (2.5 MG/3ML) 0.083% IN NEBU: 2.5000 mg | INHALATION_SOLUTION | RESPIRATORY_TRACT | Status: DC | PRN

## 2014-11-03 MED ORDER — FUROSEMIDE 10 MG/ML IJ SOLN
40.0000 mg | Freq: Every day | INTRAMUSCULAR | Status: DC
  Administered 2014-11-03 – 2014-11-04 (×2): 40 mg via INTRAVENOUS
  Filled 2014-11-03 (×2): qty 4

## 2014-11-03 NOTE — Progress Notes (Signed)
Subjective: Awake, feels much better  Objective: Vital signs in last 24 hours: Temp:  [97.8 F (36.6 C)-101.2 F (38.4 C)] 98.5 F (36.9 C) (02/01 0800) Pulse Rate:  [42-148] 98 (02/01 0800) Resp:  [13-39] 20 (02/01 0800) BP: (125-175)/(60-118) 158/85 mmHg (02/01 0800) SpO2:  [87 %-99 %] 98 % (02/01 0800) FiO2 (%):  [50 %] 50 % (01/31 1213) Weight change:   CBG (last 3)   Recent Labs  11/02/14 1640 11/02/14 2126  GLUCAP 268* 291*    Intake/Output from previous day: 01/31 0701 - 02/01 0700 In: 540 [P.O.:240; IV Piggyback:300] Out: 2725 [Urine:2725]  Physical Exam: Bright, conversant, no distress No jvd Chest clear Irregular 80s- no murmur Abdomen obese, soft, nontender No edema Neuro normal   Lab Results:  Recent Labs  11/02/14 0930 11/03/14 0630  NA 134* 137  K 3.4* 3.6  CL 102 97  CO2 26 33*  GLUCOSE 174* 161*  BUN 9 8  CREATININE 0.75 0.84  CALCIUM 9.2 9.9    Recent Labs  11/03/14 0630  AST 32  ALT 32  ALKPHOS 52  BILITOT 1.0  PROT 6.8  ALBUMIN 3.4*    Recent Labs  11/02/14 0930 11/03/14 0630  WBC 5.6 4.3  HGB 14.7 14.6  HCT 43.8 42.9  MCV 85.2 84.6  PLT 158 156   No results found for: INR, PROTIME  Recent Labs  11/02/14 1845 11/03/14 0030 11/03/14 0630  TROPONINI <0.03 <0.03 <0.03   No results for input(s): TSH, T4TOTAL, T3FREE, THYROIDAB in the last 72 hours.  Invalid input(s): FREET3 No results for input(s): VITAMINB12, FOLATE, FERRITIN, TIBC, IRON, RETICCTPCT in the last 72 hours.  Studies/Results: Dg Chest 2 View  11/02/2014   CLINICAL DATA:  cough with sob on exertion since Friday. Hx of afib, htn and dm.  EXAM: CHEST - 2 VIEW  COMPARISON:  None available  FINDINGS: Mild cardiomegaly. Tortuous atheromatous aorta. Mild central pulmonary vascular congestion. No focal infiltrate. No effusion. Visualized skeletal structures are unremarkable.  IMPRESSION: 1. Cardiomegaly and mild central pulmonary vascular congestion.    Electronically Signed   By: Arne Cleveland M.D.   On: 11/02/2014 09:08   Ct Angio Chest Pe W/cm &/or Wo Cm  11/02/2014   CLINICAL DATA:  Cough with sinus congestion. History of atrial fibrillation.  EXAM: CT ANGIOGRAPHY CHEST WITH CONTRAST  TECHNIQUE: Multidetector CT imaging of the chest was performed using the standard protocol during bolus administration of intravenous contrast. Multiplanar CT image reconstructions and MIPs were obtained to evaluate the vascular anatomy.  CONTRAST:  127mL OMNIPAQUE IOHEXOL 350 MG/ML SOLN  COMPARISON:  Current chest radiograph  FINDINGS: No evidence of [name] pulmonary embolus.  Heart is mildly enlarged. There are moderate coronary artery calcifications. Thoracic aorta is normal in caliber. No dissection. Partly calcified plaque is noted along the thoracic aorta and its branch vessels. No significant stenosis.  There is shotty mediastinal and hilar adenopathy. Several nodes were measured. Node along the right margin of the posterior trachea and right aspect of the upper esophagus in the upper mediastinum measures 12 mm short axis. Right precarinal lymph node measures 18 mm in short axis. Right subcarinal node measures 18 mm in short axis. Right hilar node measures 12 mm in short axis and [name] noted in the left hilum at this same axial level measures 11 mm in short axis.  Lungs show bilateral interstitial thickening. There is more prominent peribronchial thickening in the lower lobes and right middle lobe. There are few  small areas of ground-glass opacity most evident in the right lower lobe. No pleural effusion.  Partial imaging of the upper abdomen shows evidence of fatty infiltration of the liver. There are no adrenal masses.  Minor degenerative changes of the thoracic spine. No osteoblastic or osteolytic lesions.  Review of the MIP images confirms the above findings.  IMPRESSION: 1. No evidence of [name] pulmonary embolus. 2. Lungs show interstitial thickening with more prominent  peribronchial interstitial thickening in the lower lobes. This is most evident on right. There is small areas of associated ground-glass opacity. 3. Mild mediastinal and hilar adenopathy, presumed reactive. 4. Mild cardiomegaly. Combination of these findings could reflect mild congestive heart failure. Bilateral interstitial infiltrates with acute bronchitis should be considered if this correlates clinically.   Electronically Signed   By: Lajean Manes M.D.   On: 11/02/2014 12:15     Assessment/Plan: 1. Bibasilar pneumonia- antibiotics, better 2. Respiratory failrue- hypoxic--wean oxygen 3. Atrial fibrillation- chronic, rate controlled 4. DM2 with vascular complications-stable- hold metformin post angio 5. Atherosclerosis of aorta and coronaries per ct- no sxs 6. HTN 7. Hyperlipidemia  To floor, mobilize   LOS: 1 day   Dan [name] 11/03/2014, 8:32 AM

## 2014-11-03 NOTE — Telephone Encounter (Signed)
No charge. Hopefully he feels better

## 2014-11-03 NOTE — Progress Notes (Signed)
UR completed 

## 2014-11-04 LAB — GLUCOSE, CAPILLARY
GLUCOSE-CAPILLARY: 326 mg/dL — AB (ref 70–99)
Glucose-Capillary: 248 mg/dL — ABNORMAL HIGH (ref 70–99)

## 2014-11-04 MED ORDER — ALBUTEROL SULFATE HFA 108 (90 BASE) MCG/ACT IN AERS: 2.0000 | INHALATION_SPRAY | Freq: Four times a day (QID) | RESPIRATORY_TRACT | Status: DC | PRN

## 2014-11-04 MED ORDER — LEVOFLOXACIN 500 MG PO TABS: 500.0000 mg | ORAL_TABLET | Freq: Every day | ORAL | Status: DC

## 2014-11-04 MED ORDER — FUROSEMIDE 40 MG PO TABS: 40.0000 mg | ORAL_TABLET | Freq: Every day | ORAL | Status: DC

## 2014-11-04 NOTE — Discharge Instructions (Signed)
Atrial Fibrillation Atrial fibrillation is a condition that causes your heart to beat irregularly. It may also cause your heart to beat faster than normal. Atrial fibrillation can prevent your heart from pumping blood normally. It increases your risk of stroke and heart problems. HOME CARE  Take medications as told by your doctor.  Only take medications that your doctor says are safe. Some medications can make the condition worse or happen again.  If blood thinners were prescribed by your doctor, take them exactly as told. Too much can cause bleeding. Too little and you will not have the needed protection against stroke and other problems.  Perform blood tests at home if told by your doctor.  Perform blood tests exactly as told by your doctor.  Do not drink alcohol.  Do not drink beverages with caffeine such as coffee, soda, and some teas.  Maintain a healthy weight.  Do not use diet pills unless your doctor says they are safe. They may make heart problems worse.  Follow diet instructions as told by your doctor.  Exercise regularly as told by your doctor.  Keep all follow-up appointments. GET HELP IF:  You notice a change in the speed, rhythm, or strength of your heartbeat.  You suddenly begin peeing (urinating) more often.  You get tired more easily when moving or exercising. GET HELP RIGHT AWAY IF:   You have chest or belly (abdominal) pain.  You feel sick to your stomach (nauseous).  You are short of breath.  You suddenly have swollen feet and ankles.  You feel dizzy.  You face, arms, or legs feel numb or weak.  There is a change in your vision or speech. MAKE SURE YOU:   Understand these instructions.  Will watch your condition.  Will get help right away if you are not doing well or get worse. Document Released: 06/28/2008 Document Revised: 02/03/2014 Document Reviewed: 10/30/2012 [facility] Patient Information 2015 [place], [place]. This information is not  intended to replace advice given to you by your health care provider. Make sure you discuss any questions you have with your health care provider.  Acute Respiratory Distress Syndrome  Acute respiratory distress syndrome (ARDS) is a serious, life-threatening lung condition that can cause breathing failure. It occurs in people who are critically ill or in people who have had a serious injury.  CAUSES  ARDS occurs when small blood vessels in the lungs leak fluid into the air sacs (alveoli) of the lungs. The fluid causes the lungs to become "stiff" and decreases their ability to inflate. The fluid also prevents oxygen from being absorbed into the bloodstream. When the bloodstream does not have enough oxygen, the body's vital organs do not get enough oxygen to function properly. ARDS can occur in the following conditions:  Sepsis. This is a serious bloodstream infection.  Serious injury (trauma) to the head or chest.  Pneumonia.  After major surgery, such as a lung transplant.  Drug overdose.  Breathing (inhalation) of harmful chemicals. SYMPTOMS  ARDS comes on quickly (rapid onset) and can occur within 24 to 48 hours of an infection, illness, surgery, or injury. Symptoms include:  Shortness of breath or difficulty breathing.  Cyanosis. This is a bluish color to the skin or nail beds (due to low oxygen levels in the blood).  Fast or irregular heart rate.  Low blood pressure (hypotension).  Organ failure. DIAGNOSIS  There is not a specific test to diagnose ARDS. It is usually diagnosed when other diseases and conditions that  cause similar symptoms have been ruled out. When a person is thought to have ARDS, the following tests may be performed:  A chest X-ray or computed tomography (CT) scan to look at the lungs.  Arterial blood gas (ABG) analysis. This test looks at the oxygen level in the blood.  Blood tests to rule out infection.  Sputum culture to rule out a lung  infection.  Bronchoscopy. TREATMENT  ARDS is a critical condition. People who develop ARDS need to be in a hospital intensive care unit (ICU). Treatment of ARDS includes:  Providing oxygenation. This is a main treatment goal of ARDS. A breathing machine (ventilator) is often used to help a person breathe and to provide oxygen. When on a breathing machine, medicine is given to keep patients asleep (sedated).  Treatment of the underlying cause of ARDS (infection, illness, or trauma).  Supportive treatment such as:  Intravenous (IV) fluids.  Liquid nutrition that goes through an IV or feeding tube.  Blood pressure medicine to support low blood pressure.  Antibiotic medicine to help fight infection.  Steroid medicine to help decrease swelling (inflammation) in the lungs.  Diuretic medicine to get rid of extra fluid in the body. HOME CARE INSTRUCTIONS  After recovering from ARDS, you may have weakness, shortness of breath, or memory problems. You may also suffer from depression or from complications of the illness that caused ARDS. You can do several things to help your recovery:  Do not smoke.  If you drink alcohol, limit the amount of alcohol you drink to 1 or 2 drinks a day.  Be sure you get a yearly flu (influenza) shot. You should get a pneumonia vaccine once every 5 years.  Ask your caregiver about lung rehabilitation programs.  Ask your caregiver about local support groups for people with breathing problems.  Ask friends and family to help you if daily activities make you tired. SEEK MEDICAL CARE IF:   You become short of breath with activity or while at rest.  You develop a cough that does not go away. SEEK IMMEDIATE MEDICAL CARE IF:   You have sudden shortness of breath with or without chest pain.  You have chest pain that does not go away.  You develop swelling or pain in one of your legs.  You have trauma to your chest or any other part of your body.  You  have a fever.  You overdose or have a reaction to your medicine. MAKE SURE YOU:   Understand these instructions.  Will watch your condition.  Will get help right away if you are not doing well or get worse. Document Released: 09/19/2005 Document Revised: 02/03/2014 Document Reviewed: 06/08/2011 [facility] Patient Information 2015 [place], [place]. This information is not intended to replace advice given to you by your health care provider. Make sure you discuss any questions you have with your health care provider.

## 2014-11-04 NOTE — Progress Notes (Signed)
Pt is ready for DC home accompanied by family. Pt reports he understands all DC instructions, follow up appointments, and medications.   Prescilla Sours, Therapist, sports

## 2014-11-04 NOTE — Discharge Summary (Signed)
DISCHARGE SUMMARY  Dan [name]  MR#: [id number]  DOB:20-May-1951  Date of Admission: 11/02/2014 Date of Discharge: 11/04/2014  Attending Physician:Via Rosado A  Patient's PFX:[id number],DZHGDJM A, MD  Consults:  none  Discharge Diagnoses: Active Problems:   Pneumonia- community acquired   Hypoxic respiratory failure   Diastolic chf   Diabetes 2-vascular compl;ications   Atherosclerosis aorta   Atherosclerosis coronaries   Atrial fibrillation   Essential hypertension   hyperlipidemia      Discharge Medications:   Medication List    STOP taking these medications        triamterene-hydrochlorothiazide 37.5-25 MG per capsule  Commonly known as:  DYAZIDE      TAKE these medications        acetaminophen 500 MG tablet  Commonly known as:  TYLENOL  Take 500-1,000 mg by mouth every 6 (six) hours as needed.     albuterol 108 (90 BASE) MCG/ACT inhaler  Commonly known as:  PROVENTIL HFA;VENTOLIN HFA  Inhale 2 puffs into the lungs every 6 (six) hours as needed for wheezing or shortness of breath.     amLODipine 10 MG tablet  Commonly known as:  NORVASC  Take 10 mg by mouth daily.     BYSTOLIC 10 MG tablet  Generic drug:  nebivolol  Take 40 mg by mouth daily.     dabigatran 150 MG Caps capsule  Commonly known as:  PRADAXA  Take 1 capsule (150 mg total) by mouth every 12 (twelve) hours.     ezetimibe-simvastatin 10-20 MG per tablet  Commonly known as:  VYTORIN  Take 1 tablet by mouth at bedtime.     fluticasone 50 MCG/ACT nasal spray  Commonly known as:  FLONASE  Place 2 sprays into both nostrils daily as needed.     furosemide 40 MG tablet  Commonly known as:  LASIX  Take 1 tablet (40 mg total) by mouth daily.     guaiFENesin 600 MG 12 hr tablet  Commonly known as:  MUCINEX  Take 1 mg by mouth 2 (two) times daily as needed for cough or to loosen phlegm.     levofloxacin 500 MG tablet  Commonly known as:  LEVAQUIN  Take 1 tablet (500 mg total) by  mouth daily.     lisinopril 40 MG tablet  Commonly known as:  PRINIVIL,ZESTRIL  Take 40 mg by mouth daily.     metFORMIN 1000 MG tablet  Commonly known as:  GLUCOPHAGE  Take 1 tablet by mouth 2 (two) times daily with a meal.     oxyCODONE-acetaminophen 5-325 MG per tablet  Commonly known as:  PERCOCET  Take 1 tablet by mouth every 4 (four) hours as needed.        Hospital Procedures: Dg Chest 2 View  11/02/2014   CLINICAL DATA:  cough with sob on exertion since Friday. Hx of afib, htn and dm.  EXAM: CHEST - 2 VIEW  COMPARISON:  None available  FINDINGS: Mild cardiomegaly. Tortuous atheromatous aorta. Mild central pulmonary vascular congestion. No focal infiltrate. No effusion. Visualized skeletal structures are unremarkable.  IMPRESSION: 1. Cardiomegaly and mild central pulmonary vascular congestion.   Electronically Signed   By: Arne Cleveland M.D.   On: 11/02/2014 09:08   Ct Angio Chest Pe W/cm &/or Wo Cm  11/02/2014   CLINICAL DATA:  Cough with sinus congestion. History of atrial fibrillation.  EXAM: CT ANGIOGRAPHY CHEST WITH CONTRAST  TECHNIQUE: Multidetector CT imaging of the chest was performed using the standard protocol during  bolus administration of intravenous contrast. Multiplanar CT image reconstructions and MIPs were obtained to evaluate the vascular anatomy.  CONTRAST:  140mL OMNIPAQUE IOHEXOL 350 MG/ML SOLN  COMPARISON:  Current chest radiograph  FINDINGS: No evidence of a pulmonary embolus.  Heart is mildly enlarged. There are moderate coronary artery calcifications. Thoracic aorta is normal in caliber. No dissection. Partly calcified plaque is noted along the thoracic aorta and its branch vessels. No significant stenosis.  There is shotty mediastinal and hilar adenopathy. Several nodes were measured. Node along the right margin of the posterior trachea and right aspect of the upper esophagus in the upper mediastinum measures 12 mm short axis. Right precarinal lymph node  measures 18 mm in short axis. Right subcarinal node measures 18 mm in short axis. Right hilar node measures 12 mm in short axis and a noted in the left hilum at this same axial level measures 11 mm in short axis.  Lungs show bilateral interstitial thickening. There is more prominent peribronchial thickening in the lower lobes and right middle lobe. There are few small areas of ground-glass opacity most evident in the right lower lobe. No pleural effusion.  Partial imaging of the upper abdomen shows evidence of fatty infiltration of the liver. There are no adrenal masses.  Minor degenerative changes of the thoracic spine. No osteoblastic or osteolytic lesions.  Review of the MIP images confirms the above findings.  IMPRESSION: 1. No evidence of a pulmonary embolus. 2. Lungs show interstitial thickening with more prominent peribronchial interstitial thickening in the lower lobes. This is most evident on right. There is small areas of associated ground-glass opacity. 3. Mild mediastinal and hilar adenopathy, presumed reactive. 4. Mild cardiomegaly. Combination of these findings could reflect mild congestive heart failure. Bilateral interstitial infiltrates with acute bronchitis should be considered if this correlates clinically.   Electronically Signed   By: Lajean Manes M.D.   On: 11/02/2014 12:15    History of Present Illness:  64 year old morbidly obese gentleman with atrial fibrillation on anticoagulation, obstructive sleep apnea on see Pap machine, and history of recurrent sinusitis, treated by otolaryngology Dr. Wilburn Cornelia. Other issues include hyperlipidemia hypertension type 2 diabetes on oral agents recent FOBT positive scheduled for EGD and colonoscopy on Wednesday, history of low-risk Myoview stress test 2014, last echocardiogram with EF normal and 2007. However patient reports an echocardiogram approximately 2 weeks ago with Dr. Martinique however do not find this and the hospital chart. 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 He was recently in [facility] coming back to [place] this past Thursday. Over the weekend developed progressive sinus congestion, without fevers, chills, minor wheezing, but no significant limitations on ADLs, went to urgent care center this morning thinking he would ask for Z-Pak however they noted that he was hypoxic, and after initial presentation developed worsening wheezing respiratory distress increased work of breathing, CT scan negative for pulmonary embolism, positive for question of pulmonary edema versus I needs consistent with acute bronchitis. Patient states that he is never been treated with routine and regular use of inhalers, he is a former smoker. Patient was treated with nebulizer treatments with some improvement, in addition to one dose of diuretics based on CT scan findings-however hypoxia demonstrated on ABG, as well as the need for oxygen and actual temper use of BiPAP, given pulmonary distress, concerning findings on CT scan, and continued bronchospasm with hypoxia, patient transferred to stepdown  unit for further evaluation and management. Patient was febrile to 99.4 at urgent care however question of whether it went as high as 100 per patient report. Hospital Course: Admitted to stepdown initially with hypoxia and increrased work of breathing.  Supplemental oxygen to include transient bipap, ultimately nasal cannula and weaning off of oxygen with saturations room air 94%.  Treated empirically with iv antibiotics, bronchodilators and empirically low dose diuresis.  This appeared more infectious based upon fever and purulence.  On morning of discharge back to baseline, clear exam, eating and ambulating.  Blood sugars stable.  Day of Discharge Exam BP 148/90 mmHg  Pulse 87  Temp(Src) 98.4 F (36.9 C) (Oral)  Resp 20  Ht 6\' 1"  (1.854 m)  Wt 127.143 kg (280  lb 4.8 oz)  BMI 36.99 kg/m2  SpO2 93%  Physical Exam: General appearance: alert, cooperative and no distress Eyes: no scleral icterus Throat: oropharynx moist without erythema Resp: clear to auscultation bilaterally Cardio: irregularly irregular rhythm Extremities: no clubbing, cyanosis or edema Neuro normal  Discharge Labs:  Recent Labs  11/02/14 0930 11/03/14 0630  NA 134* 137  K 3.4* 3.6  CL 102 97  CO2 26 33*  GLUCOSE 174* 161*  BUN 9 8  CREATININE 0.75 0.84  CALCIUM 9.2 9.9    Recent Labs  11/03/14 0630  AST 32  ALT 32  ALKPHOS 52  BILITOT 1.0  PROT 6.8  ALBUMIN 3.4*    Recent Labs  11/02/14 0930 11/03/14 0630  WBC 5.6 4.3  HGB 14.7 14.6  HCT 43.8 42.9  MCV 85.2 84.6  PLT 158 156    Recent Labs  11/02/14 1845 11/03/14 0030 11/03/14 0630  TROPONINI <0.03 <0.03 <0.03   No results for input(s): TSH, T4TOTAL, T3FREE, THYROIDAB in the last 72 hours.  Invalid input(s): FREET3 No results for input(s): VITAMINB12, FOLATE, FERRITIN, TIBC, IRON, RETICCTPCT in the last 72 hours.  Discharge instructions:     Discharge Instructions    Diet - low sodium heart healthy    Complete by:  As directed      Increase activity slowly    Complete by:  As directed            Disposition: home  Follow-up Appts: Follow-up with Dr. Reynaldo Minium at [facility] in 1 week.  Call for appointment.  Condition on Discharge: improved, looks great  Tests Needing Follow-up: none  Signed: Angline Schweigert A 11/04/2014, 10:45 AM

## 2014-11-05 ENCOUNTER — Encounter: Payer: 59 | Admitting: Internal Medicine

## 2014-11-08 LAB — CULTURE, BLOOD (ROUTINE X 2)
CULTURE: NO GROWTH
Culture: NO GROWTH

## 2014-11-21 ENCOUNTER — Telehealth: Payer: Self-pay

## 2014-11-21 NOTE — Telephone Encounter (Signed)
Patient called received your fax on vitapulse diet supplement.Dr.Jordan advised no way of knowing if contraindication with pradaxa.Advised not to take.

## 2014-11-24 ENCOUNTER — Encounter: Payer: Self-pay | Admitting: Internal Medicine

## 2014-11-24 ENCOUNTER — Telehealth: Payer: Self-pay | Admitting: Internal Medicine

## 2014-11-24 NOTE — Telephone Encounter (Signed)
Since he is on a Blood Thinner we have scheduled a Previsit/yf

## 2014-11-27 ENCOUNTER — Ambulatory Visit (AMBULATORY_SURGERY_CENTER): Payer: Self-pay

## 2014-11-27 VITALS — Ht 73.0 in | Wt 278.0 lb

## 2014-11-27 DIAGNOSIS — Z860100 Personal history of colon polyps, unspecified: Secondary | ICD-10-CM

## 2014-11-27 NOTE — Progress Notes (Signed)
No allergies to eggs or soy No diet/weight loss meds No past problems with anesthesia No home oxygen  Has email  Emmi instructions given for colonoscopy

## 2014-12-11 ENCOUNTER — Encounter: Payer: Self-pay | Admitting: Internal Medicine

## 2014-12-11 ENCOUNTER — Ambulatory Visit (AMBULATORY_SURGERY_CENTER): Payer: 59 | Admitting: Internal Medicine

## 2014-12-11 VITALS — BP 128/76 | HR 82 | Temp 97.5°F | Resp 16 | Ht 73.0 in | Wt 278.0 lb

## 2014-12-11 DIAGNOSIS — R195 Other fecal abnormalities: Secondary | ICD-10-CM

## 2014-12-11 DIAGNOSIS — Z86010 Personal history of colonic polyps: Secondary | ICD-10-CM

## 2014-12-11 DIAGNOSIS — D124 Benign neoplasm of descending colon: Secondary | ICD-10-CM

## 2014-12-11 DIAGNOSIS — K219 Gastro-esophageal reflux disease without esophagitis: Secondary | ICD-10-CM

## 2014-12-11 LAB — GLUCOSE, CAPILLARY
Glucose-Capillary: 155 mg/dL — ABNORMAL HIGH (ref 70–99)
Glucose-Capillary: 138 mg/dL — ABNORMAL HIGH (ref 70–99)

## 2014-12-11 MED ORDER — OMEPRAZOLE 20 MG PO CPDR: 20.0000 mg | DELAYED_RELEASE_CAPSULE | Freq: Every day | ORAL | Status: DC

## 2014-12-11 MED ORDER — SODIUM CHLORIDE 0.9 % IV SOLN: 500.0000 mL | INTRAVENOUS | Status: DC

## 2014-12-11 NOTE — Addendum Note (Signed)
Addended by: Steva Ready on: 12/11/2014 01:55 PM   Modules accepted: Level of Service

## 2014-12-11 NOTE — Progress Notes (Signed)
Report to PACU, RN, vss, BBS= Clear.  

## 2014-12-11 NOTE — Progress Notes (Signed)
Called to room to assist during endoscopic procedure.  Patient ID and intended procedure confirmed with present staff. Received instructions for my participation in the procedure from the performing physician.  

## 2014-12-11 NOTE — Patient Instructions (Addendum)
Resume your Pradaxa today.   You had an ENDOSCOPIC PROCEDURE TODAY AT [facility]:   Refer to the procedure report that was given to you for any specific questions about what was found during the examination.  If the procedure report does not answer your questions, please call your gastroenterologist to clarify.  If you requested that your care partner not be given the details of your procedure findings, then the procedure report has been included in a sealed envelope for you to review at your convenience later.  YOU SHOULD EXPECT: Some feelings of bloating in the abdomen. Passage of more gas than usual.  Walking can help get rid of the air that was put into your GI tract during the procedure and reduce the bloating. If you had a lower endoscopy (such as a colonoscopy or flexible sigmoidoscopy) you may notice spotting of blood in your stool or on the toilet paper. If you underwent a bowel prep for your procedure, you may not have a normal bowel movement for a few days.  Please Note:  You might notice some irritation and congestion in your nose or some drainage.  This is from the oxygen used during your procedure.  There is no need for concern and it should clear up in a day or so.  SYMPTOMS TO REPORT IMMEDIATELY:   Following lower endoscopy (colonoscopy or flexible sigmoidoscopy):  Excessive amounts of blood in the stool  Significant tenderness or worsening of abdominal pains  Swelling of the abdomen that is new, acute  Fever of 100F or higher   Following upper endoscopy (EGD)  Vomiting of blood or coffee ground material  New chest pain or pain under the shoulder blades  Painful or persistently difficult swallowing  New shortness of breath  Fever of 100F or higher  Black, tarry-looking stools  For urgent or emergent issues, a gastroenterologist can be reached at any hour by calling [phone].   DIET: Your first meal following the procedure should be a small meal  and then it is ok to progress to your normal diet. Heavy or fried foods are harder to digest and may make you feel nauseous or bloated.  Likewise, meals heavy in dairy and vegetables can increase bloating.  Drink plenty of fluids but you should avoid alcoholic beverages for 24 hours.  ACTIVITY:  You should plan to take it easy for the rest of today and you should NOT DRIVE or use heavy machinery until tomorrow (because of the sedation medicines used during the test).    FOLLOW UP: Our staff will call the number listed on your records the next business day following your procedure to check on you and address any questions or concerns that you may have regarding the information given to you following your procedure. If we do not reach you, we will leave a message.  However, if you are feeling well and you are not experiencing any problems, there is no need to return our call.  We will assume that you have returned to your regular daily activities without incident.  If any biopsies were taken you will be contacted by phone or by letter within the next 1-3 weeks.  Please call us at [phone] if you have not heard about the biopsies in 3 weeks.    SIGNATURES/CONFIDENTIALITY: You and/or your care partner have signed paperwork which will be entered into your electronic medical record.  These signatures attest to the fact that that the information above on your  After Visit Summary has been reviewed and is understood.  Full responsibility of the confidentiality of this discharge information lies with you and/or your care-partner.

## 2014-12-11 NOTE — Op Note (Signed)
[place]  [address] [place], [id number]   ENDOSCOPY PROCEDURE REPORT  PATIENT: Dan [name], Dan [name]  MR#: [id number] BIRTHDATE: 07/28/1951 , 66  yrs. old GENDER: male ENDOSCOPIST: Eustace Quail, MD REFERRED BY:  Burnard Bunting, M.D. PROCEDURE DATE:  12/11/2014 PROCEDURE:  EGD, diagnostic ASA CLASS:     Class III INDICATIONS:  heme positive stool and history of esophageal reflux.  MEDICATIONS: Monitored anesthesia care, Propofol 150 mg IV, and Lidocaine 200 mg IV TOPICAL ANESTHETIC: none  DESCRIPTION OF PROCEDURE: After the risks benefits and alternatives of the procedure were thoroughly explained, informed consent was obtained.  The LB [id number] endoscope was introduced through the mouth and advanced to the second portion of the duodenum , Without limitations.  The instrument was slowly withdrawn as the mucosa was fully examined.    EXAM:Esophagus revealed mild esophagitis with small distal erosions. Stomach was normal.  The duodenum was normal.  Retroflexed views revealed a hiatal hernia.     The scope was then withdrawn from the patient and the procedure completed.  COMPLICATIONS: There were no immediate complications.  ENDOSCOPIC IMPRESSION: 1. GERD with mild distal esophagitis  RECOMMENDATIONS: 1.  Anti-reflux regimen to be followed (the nurse will provide you with information) 2.  Prescribe omeprazole 20 mg by mouth every morning; #30; 11 refills. This will treat acid reflux as well as protect her stomach against possible GI bleeding.  REPEAT EXAM:  eSigned:  Eustace Quail, MD 12/11/2014 3:49 PM    CB:[id number] Reynaldo Minium, MD and The Patient

## 2014-12-11 NOTE — Op Note (Signed)
[place]  [address] [place], [id number]   COLONOSCOPY PROCEDURE REPORT  PATIENT: Dan [name], Dan [name]  MR#: [id number] BIRTHDATE: 06-23-1951 , 89  yrs. old GENDER: male ENDOSCOPIST: Eustace Quail, MD REFERRED [id number] Reynaldo Minium, M.D. PROCEDURE DATE:  12/11/2014 PROCEDURE:   Colonoscopy, diagnostic and Colonoscopy with snare polypectomy x 1. First Screening Colonoscopy - Avg.  risk and is 50 yrs.  old or older - No.  Prior Negative Screening - Now for repeat screening. N/A  History of Adenoma - Now for follow-up colonoscopy & has been > or = to 3 yrs.  Yes hx of adenoma.  Has been 3 or more years since last colonoscopy. ASA CLASS:   Class III INDICATIONS:Evaluation of unexplained GI bleeding and Patient is not applicable for Colorectal Neoplasm Risk Assessment for this procedure. Multiple prior colonoscopies with full adenomatous. Last exam 8 2012. Colon cancer in mom  at advanced age. MEDICATIONS: Monitored anesthesia care and Propofol 300 mg IV  DESCRIPTION OF PROCEDURE:   After the risks benefits and alternatives of the procedure were thoroughly explained, informed consent was obtained.  The digital rectal exam revealed no abnormalities of the rectum.   The LB [id number]  endoscope was introduced through the anus and advanced to the cecum, which was identified by both the appendix and ileocecal valve. No adverse events experienced.   The quality of the prep was good.  (MoviPrep was used)  The instrument was then slowly withdrawn as the colon was fully examined.  COLON FINDINGS: A single polyp measuring 4 mm in size was found in the descending colon.  A polypectomy was performed with a cold snare.  The resection was complete, the polyp tissue was completely retrieved and sent to histology.   There was moderate diverticulosis noted in the left colon.   The examination was otherwise normal.  Retroflexed views revealed internal hemorrhoids. The  time to cecum = 1.8 Withdrawal time = 19.8   The scope was withdrawn and the procedure completed. COMPLICATIONS: There were no immediate complications.  ENDOSCOPIC IMPRESSION: 1.   Single polyp was found in the descending colon; polypectomy was performed with a cold snare 2.   Moderate diverticulosis was noted in the left colon 3.   The examination was otherwise normal  RECOMMENDATIONS: 1.  Follow up colonoscopy in 5 years 2.  Upper endoscopy today (see report) 3. Resume Pradaxa today  eSigned:  Eustace Quail, MD 12/11/2014 3:44 PM   cc: Burnard Bunting, MD and The Patient

## 2014-12-12 ENCOUNTER — Telehealth: Payer: Self-pay | Admitting: *Deleted

## 2014-12-12 NOTE — Telephone Encounter (Signed)
  Follow up Call-  Call back number 12/11/2014  Post procedure Call Back phone  # [phone]  Permission to leave phone message Yes     Patient questions:  Do you have a fever, pain , or abdominal swelling? No. Pain Score  0 *  Have you tolerated food without any problems? Yes.    Have you been able to return to your normal activities? Yes.    Do you have any questions about your discharge instructions: Diet   No. Medications  No. Follow up visit  No.  Do you have questions or concerns about your Care? No.  Actions: * If pain score is 4 or above: No action needed, pain <4.

## 2014-12-16 ENCOUNTER — Encounter: Payer: Self-pay | Admitting: Internal Medicine

## 2015-01-01 ENCOUNTER — Encounter: Payer: 59 | Admitting: Internal Medicine

## 2015-03-24 ENCOUNTER — Telehealth: Payer: Self-pay

## 2015-03-24 NOTE — Telephone Encounter (Signed)
Received surgical clearance from [place] for upcoming left total knee replacement.Dr.Bean's office called spoke to [facility] Dr.Jordan out of office this week,will send surgical clearance back 03/30/15.

## 2015-03-30 ENCOUNTER — Other Ambulatory Visit: Payer: Self-pay

## 2015-04-01 ENCOUNTER — Ambulatory Visit: Payer: Self-pay | Admitting: Orthopedic Surgery

## 2015-04-10 ENCOUNTER — Telehealth: Payer: Self-pay

## 2015-04-10 NOTE — Telephone Encounter (Signed)
Received surgical clearance from [place] office.Dr.Jordan cleared pt for surgery and advised to hold pradaxa 72 hours prior to surgery.Form faxed back 03/30/15 to fax # [phone].

## 2015-04-24 ENCOUNTER — Encounter (HOSPITAL_COMMUNITY): Payer: Self-pay

## 2015-04-24 ENCOUNTER — Encounter (HOSPITAL_COMMUNITY)
Admission: RE | Admit: 2015-04-24 | Discharge: 2015-04-24 | Disposition: A | Discharge status: Home / Self Care | Payer: 59 | Source: Ambulatory Visit | Attending: Specialist | Admitting: Specialist

## 2015-04-24 ENCOUNTER — Ambulatory Visit (HOSPITAL_COMMUNITY)
Admission: RE | Admit: 2015-04-24 | Discharge: 2015-04-24 | Disposition: A | Discharge status: Home / Self Care | Payer: 59 | Source: Ambulatory Visit | Attending: Orthopedic Surgery | Admitting: Orthopedic Surgery

## 2015-04-24 DIAGNOSIS — I739 Peripheral vascular disease, unspecified: Secondary | ICD-10-CM | POA: Diagnosis not present

## 2015-04-24 DIAGNOSIS — M1712 Unilateral primary osteoarthritis, left knee: Secondary | ICD-10-CM | POA: Insufficient documentation

## 2015-04-24 DIAGNOSIS — M1990 Unspecified osteoarthritis, unspecified site: Secondary | ICD-10-CM | POA: Diagnosis present

## 2015-04-24 HISTORY — DX: Unspecified osteoarthritis, unspecified site: M19.90

## 2015-04-24 HISTORY — DX: Gastro-esophageal reflux disease without esophagitis: K21.9

## 2015-04-24 HISTORY — DX: Cardiac arrhythmia, unspecified: I49.9

## 2015-04-24 LAB — BASIC METABOLIC PANEL
Anion gap: 8 (ref 5–15)
BUN: 17 mg/dL (ref 6–20)
CO2: 31 mmol/L (ref 22–32)
Calcium: 10.4 mg/dL — ABNORMAL HIGH (ref 8.9–10.3)
Chloride: 100 mmol/L — ABNORMAL LOW (ref 101–111)
Creatinine, Ser: 0.72 mg/dL (ref 0.61–1.24)
GLUCOSE: 159 mg/dL — AB (ref 65–99)
Potassium: 3.9 mmol/L (ref 3.5–5.1)
SODIUM: 139 mmol/L (ref 135–145)

## 2015-04-24 LAB — URINALYSIS, ROUTINE W REFLEX MICROSCOPIC
Bilirubin Urine: NEGATIVE
Glucose, UA: NEGATIVE mg/dL
Hgb urine dipstick: NEGATIVE
KETONES UR: NEGATIVE mg/dL
Leukocytes, UA: NEGATIVE
Nitrite: NEGATIVE
PH: 5.5 (ref 5.0–8.0)
Protein, ur: 100 mg/dL — AB
Specific Gravity, Urine: 1.021 (ref 1.005–1.030)
Urobilinogen, UA: 1 mg/dL (ref 0.0–1.0)

## 2015-04-24 LAB — CBC
HEMATOCRIT: 44.2 % (ref 39.0–52.0)
HEMOGLOBIN: 14.4 g/dL (ref 13.0–17.0)
MCH: 28 pg (ref 26.0–34.0)
MCHC: 32.6 g/dL (ref 30.0–36.0)
MCV: 86 fL (ref 78.0–100.0)
PLATELETS: 200 10*3/uL (ref 150–400)
RBC: 5.14 MIL/uL (ref 4.22–5.81)
RDW: 14.2 % (ref 11.5–15.5)
WBC: 6 10*3/uL (ref 4.0–10.5)

## 2015-04-24 LAB — URINE MICROSCOPIC-ADD ON

## 2015-04-24 LAB — PROTIME-INR
INR: 1.24 (ref 0.00–1.49)
Prothrombin Time: 15.7 seconds — ABNORMAL HIGH (ref 11.6–15.2)

## 2015-04-24 LAB — SURGICAL PCR SCREEN
MRSA, PCR: NEGATIVE
STAPHYLOCOCCUS AUREUS: NEGATIVE

## 2015-04-24 LAB — ABO/RH: ABO/RH(D): O POS

## 2015-04-24 NOTE — Progress Notes (Signed)
04-24-15 1155 Labs viewable in [place], note PT/INR, will repeat Am of.

## 2015-04-24 NOTE — Patient Instructions (Addendum)
[place]  04/24/2015   Your procedure is scheduled on:   04-30-2015 Thursday  Enter through [facility]  Entrance and follow signs to [facility]. Arrive at   0700     AM ..  (Limit 1 person with you).  Call this number if you have problems the morning of surgery: [phone]  Or Presurgical Testing [phone].   For Living Will and/or Health Care Power Attorney Forms: please provide copy for your medical record,may bring AM of surgery(Forms should be already notarized -we do not provide this service).(04-24-15 will provide in information the day of surgery preop, if can locate).  For Cpap use: Bring mask and tubing only.   Do not eat food/ or drink: After Midnight.      Take these medicines the morning of surgery with A SIP OF WATER: Amlodipine. Bystolic.Vytorin. Omeprazole. Use/Bring Inhaler. No Diabetic meds AM of. Use Pradaxa per MD instructions.   Do not wear jewelry, make-up or nail polish.  Do not wear deodorant, lotions, powders, or perfumes.   Do not shave legs and under arms- 48 hours(2 days) prior to first CHG shower.(Shaving face and neck okay.)  Do not bring valuables to the hospital.(Hospital is not responsible for lost valuables).  Contacts, dentures or removable bridgework, body piercing, hair pins may not be worn into surgery.  Leave suitcase in the car. After surgery it may be brought to your room.  For patients admitted to the hospital, checkout time is 11:00 AM the day of discharge.(Restricted visitors-Any Persons displaying flu-like symptoms or illness).    Patients discharged the day of surgery will not be allowed to drive home. Must have responsible person with you x 24 hours once discharged.  Name and phone number of your driver: Ginny Forth [phone] cell     Please read over the following fact sheets that you were given:  CHG(Chlorhexidine Gluconate 4% Surgical Soap) use, MRSA Information, Incentive Spirometry  Instruction.  Remember : Type/Screen "Blue armbands" - may not be removed once applied(would result in being retested AM of surgery, if removed).         [facility] - Preparing for Surgery Before surgery, you can play an important role.  Because skin is not sterile, your skin needs to be as free of germs as possible.  You can reduce the number of germs on your skin by washing with CHG (chlorahexidine gluconate) soap before surgery.  CHG is an antiseptic cleaner which kills germs and bonds with the skin to continue killing germs even after washing. Please DO NOT use if you have an allergy to CHG or antibacterial soaps.  If your skin becomes reddened/irritated stop using the CHG and inform your nurse when you arrive at Short Stay. Do not shave (including legs and underarms) for at least 48 hours prior to the first CHG shower.  You may shave your face/neck. Please follow these instructions carefully:  1.  Shower with CHG Soap the night before surgery and the  morning of Surgery.  2.  If you choose to wash your hair, wash your hair first as usual with your  normal  shampoo.  3.  After you shampoo, rinse your hair and body thoroughly to remove the  shampoo.                           4.  Use CHG as you would any other liquid soap.  You can apply chg directly  to the skin and wash                       Gently with a scrungie or clean washcloth.  5.  Apply the CHG Soap to your body ONLY FROM THE NECK DOWN.   Do not use on face/ open                           Wound or open sores. Avoid contact with eyes, ears mouth and genitals (private parts).                       Wash face,  Genitals (private parts) with your normal soap.             6.  Wash thoroughly, paying special attention to the area where your surgery  will be performed.  7.  Thoroughly rinse your body with warm water from the neck down.  8.  DO NOT shower/wash with your normal soap after using and rinsing off  the CHG Soap.                 9.  Pat yourself dry with a clean towel.            10.  Wear clean pajamas.            11.  Place clean sheets on your bed the night of your first shower and do not  sleep with pets. Day of Surgery : Do not apply any lotions/deodorants the morning of surgery.  Please wear clean clothes to the hospital/surgery center.  FAILURE TO FOLLOW THESE INSTRUCTIONS MAY RESULT IN THE CANCELLATION OF YOUR SURGERY PATIENT SIGNATURE_________________________________  NURSE SIGNATURE__________________________________  ________________________________________________________________________   Adam Phenix  An incentive spirometer is a tool that can help keep your lungs clear and active. This tool measures how well you are filling your lungs with each breath. Taking long deep breaths may help reverse or decrease the chance of developing breathing (pulmonary) problems (especially infection) following:  A long period of time when you are unable to move or be active. BEFORE THE PROCEDURE   If the spirometer includes an indicator to show your best effort, your nurse or respiratory therapist will set it to a desired goal.  If possible, sit up straight or lean slightly forward. Try not to slouch.  Hold the incentive spirometer in an upright position. INSTRUCTIONS FOR USE  1. Sit on the edge of your bed if possible, or sit up as far as you can in bed or on a chair. 2. Hold the incentive spirometer in an upright position. 3. Breathe out normally. 4. Place the mouthpiece in your mouth and seal your lips tightly around it. 5. Breathe in slowly and as deeply as possible, raising the piston or the ball toward the top of the column. 6. Hold your breath for 3-5 seconds or for as long as possible. Allow the piston or ball to fall to the bottom of the column. 7. Remove the mouthpiece from your mouth and breathe out normally. 8. Rest for a few seconds and repeat Steps 1 through 7 at least 10 times every 1-2  hours when you are awake. Take your time and take a few normal breaths between deep breaths. 9. The spirometer may include an indicator to show your best effort. Use the indicator as a goal to work toward during each repetition. 10. After  each set of 10 deep breaths, practice coughing to be sure your lungs are clear. If you have an incision (the cut made at the time of surgery), support your incision when coughing by placing a pillow or rolled up towels firmly against it. Once you are able to get out of bed, walk around indoors and cough well. You may stop using the incentive spirometer when instructed by your caregiver.  RISKS AND COMPLICATIONS  Take your time so you do not get dizzy or light-headed.  If you are in pain, you may need to take or ask for pain medication before doing incentive spirometry. It is harder to take a deep breath if you are having pain. AFTER USE  Rest and breathe slowly and easily.  It can be helpful to keep track of a log of your progress. Your caregiver can provide you with a simple table to help with this. If you are using the spirometer at home, follow these instructions: [place] IF:   You are having difficultly using the spirometer.  You have trouble using the spirometer as often as instructed.  Your pain medication is not giving enough relief while using the spirometer.  You develop fever of 100.5 F (38.1 C) or higher. SEEK IMMEDIATE MEDICAL CARE IF:   You cough up bloody sputum that had not been present before.  You develop fever of 102 F (38.9 C) or greater.  You develop worsening pain at or near the incision site. MAKE SURE YOU:   Understand these instructions.  Will watch your condition.  Will get help right away if you are not doing well or get worse. Document Released: 01/30/2007 Document Revised: 12/12/2011 Document Reviewed: 04/02/2007 [facility] Patient Information 2014 [place],  [place].   ________________________________________________________________________

## 2015-04-24 NOTE — Pre-Procedure Instructions (Addendum)
04-24-15 Clearance note(02-25-15 Dr. Reynaldo Minium) with chart. EKG 1'16 Epic.

## 2015-04-25 ENCOUNTER — Ambulatory Visit: Payer: Self-pay | Admitting: Orthopedic Surgery

## 2015-04-25 NOTE — H&P (Signed)
Dan [name] DOB: 1951/04/17 Married / Language: English / Race: White Male  H&P Date: 04/24/15  Chief Complaint: Left knee pain  History of Present Illness The patient is a 64 year old male who comes in today for a preoperative History and Physical. The patient is scheduled for a left total knee arthroplasty to be performed by Dr. Johnn Hai, MD at [facility] on 04/30/2015. Dan [name] is s/p L knee arthroscopy in June 2015, grade 3 and 4 changes. He has had worsening pain this year, temporary relief with steroid injections and viscosupplementation. His pain is interfering with ADL's at this point and he desires to proceed with surgery.  Dr. Tonita Cong and the patient mutually agreed to proceed with a left total knee replacement. Risks and benefits of the procedure were discussed including stiffness, suboptimal range of motion, persistent pain, infection requiring removal of prosthesis and reinsertion, need for prophylactic antibiotics in the future, for example, dental procedures, possible need for manipulation, revision in the future and also anesthetic complications including DVT, PE, etc. We discussed the perioperative course, time in the hospital, postoperative recovery and the need for elevation to control swelling. We also discussed the predicted range of motion and the probability that squatting and kneeling would be unobtainable in the future. In addition, postoperative anticoagulation was discussed. We have obtained preoperative medical clearance as necessary. Provided her illustrated handout and discussed it in detail. They will enroll in the total joint replacement educational forum at the hospital.  Past Medical Hx High blood pressure Sleep Apnea CPAP Cardiac Arrhythmia  Atrial Fibrillation Hemorrhoids Non-Insulin Dependent Diabetes Mellitus  Allergies No Known Drug Allergies07/04/2013  Family History  Cancer First Degree Relatives. sister No pertinent  family history Hypertension father First Degree Relatives  Social History Tobacco use Former smoker. former smoker; smoke(d) 2 pack(s) per day Exercise Exercises daily; does running / walking and individual sport Illicit drug use no Drug/Alcohol Rehab (Currently) no Most recent primary occupation [employer] Tobacco / smoke exposure no Pain Contract no Previously in rehab no Children 2 Current work status working full time Marital status married Living situation live with spouse, 2 story home with master bedroom 1st floor, 2 steps to enter Number of flights of stairs before winded 2-3 Alcohol use current drinker; drinks hard liquor; 5-7 per week Post-Surgical Plans home with [place] none  Medication History  AmLODIPine Besylate (Oral) Specific dose unknown - Active. Lisinopril (40MG Tablet, Oral) Active. MetFORMIN HCl (1000MG Tablet, Oral) Active. Pradaxa (150MG Capsule, Oral) Active. Vytorin (10-20MG Tablet, Oral) Active. Januvia (50MG Tablet, Oral) Active. Furosemide (Oral) Specific dose unknown - Active. Bystolic (57QI Tablet, Oral) Active. Fluticasone Propionate (50MCG/ACT Suspension, Nasal as needed) Active. Medications Reconciled  Past Surgical History  Arthroscopic Knee Surgery - Left  Review of Systems General Not Present- Chills, Fatigue, Fever, Memory Loss, Night Sweats, Weight Gain and Weight Loss. Skin Not Present- Eczema, Hives, Itching, Lesions and Rash. HEENT Not Present- Dentures, Double Vision, Headache, Hearing Loss, Tinnitus and Visual Loss. Respiratory Not Present- Allergies, Chronic Cough, Coughing up blood, Shortness of breath at rest and Shortness of breath with exertion. Cardiovascular Not Present- Chest Pain, Difficulty Breathing Lying Down, Murmur, Palpitations, Racing/skipping heartbeats and Swelling. Gastrointestinal Not Present- Abdominal Pain, Bloody Stool, Constipation, Diarrhea, Difficulty  Swallowing, Heartburn, Jaundice, Loss of appetitie, Nausea and Vomiting. Male Genitourinary Not Present- Blood in Urine, Discharge, Flank Pain, Incontinence, Painful Urination, Urgency, Urinary frequency, Urinary Retention, Urinating at Night and Weak urinary stream. Musculoskeletal Present- Joint  Pain, Joint Swelling, Morning Stiffness and Muscle Pain. Not Present- Back Pain, Muscle Weakness and Spasms. Neurological Not Present- Blackout spells, Difficulty with balance, Dizziness, Paralysis, Tremor and Weakness. Psychiatric Not Present- Insomnia.  Physical Exam  General Mental Status -Alert, cooperative and good historian. General Appearance-pleasant, Not in acute distress. Orientation-Oriented X3. Build & Nutrition-Well nourished and Well developed. Gait-Antalgic.  Head and Neck Head-normocephalic, atraumatic . Neck Global Assessment - supple, no bruit auscultated on the right, no bruit auscultated on the left.  Eye Pupil - Bilateral-Regular and Round. Motion - Bilateral-EOMI.  Chest and Lung Exam Auscultation Breath sounds - clear at anterior chest wall and clear at posterior chest wall. Adventitious sounds - No Adventitious sounds.  Cardiovascular Auscultation Rhythm - Regular rate and rhythm. Heart Sounds - S1 WNL and S2 WNL. Murmurs & Other Heart Sounds - Auscultation of the heart reveals - No Murmurs.  Abdomen Palpation/Percussion Tenderness - Abdomen is non-tender to palpation. Rigidity (guarding) - Abdomen is soft. Auscultation Auscultation of the abdomen reveals - Bowel sounds normal.  Male Genitourinary Note: Not done, not pertinent to present illness  Musculoskeletal Note: Left Knee: Inspection and Palpation - Tenderness - medial joint line tender to palpation, no tenderness to palpation of the superior calf, no tenderness to palpation of the quadriceps tendon, no tenderness to palpation of the patellar tendon, no tenderness to palpation of the  patella, no tenderness to palpation of the lateral joint line, no tenderness to palpation of the fibular head, no tenderness to palpation of the peroneal nerve. Patellar Tendon - no pain to palpation of the patellar tendon. Swelling - periarticular swelling present. Effusion - trace. Tissue tension/texture is - soft. Pulses - 2+. Sensation - intact to light touch. Skin - Color - no ecchymosis, no erythema. Wound/Incision - Appearance - The incision is well healed with no erythema or exudates(scope portals). Strength and Tone - Quadriceps - 5/5. Hamstrings - 5/5. ROM: Flexion - AROM - 120 . Extension - AROM - 0 . Stability - Valgus Laxity at 30 - None. Valgus Laxity at 0 - None. Varus Laxity at 30 - None. Varus Laxity at 0 - None. Lachman - Negative. Anterior Drawer Test - Negative. Posterior Drawer Test - Negative. Left Knee - Deformities/Malalignments/Discrepancies - no deformities noted. Special Tests - McMurray Test (lateral) - negative. McMurray Test (medial) - negative. Patellar Compression Pain - no pain. Ankle/Foot: Foot - Deformities/Malalignments/Discrepancies - Note: pes planus bilaterally.  Imaging Xrays from 12/2014 reviewed. There is progressive joint space narrowing since previous xrays in 04/2013, now with collapse of the medial joint space, bone-on-bone arthrosis, slight varus angulation. There are mild degenerative changes noted lateral and PF joint spaces as well. Right knee with mild degenerative changes.  Assessment & Plan Primary osteoarthritis of left knee (M17.12)  Pt with end-stage left knee DJD, bone-on-bone, refractory to conservative tx, scheduled for left total knee replacement by Dr. Tonita Cong. We again discussed the procedure itself as well as risks, complications and alternatives, including but not limited to DVT, PE, infx, bleeding, failure of procedure, need for secondary procedure including manipulation, nerve injury, ongoing pain/symptoms, anesthesia risk, even stroke or  death. Also discussed typical post-op protocols, activity restrictions, need for PT, flexion/extension exercises, time out of work. Discussed need for DVT ppx post-op with Xarelto then ASA per protocol. Discussed dental ppx. Also discussed limitations post-operatively such as kneeling and squatting. All questions were answered. Patient desires to proceed with surgery as scheduled. Will hold Pradaxa accordingly. Will remain NPO after [place]  night before surgery. Will present to [facility] for pre-op testing. Plan Xarelto 2 weeks post-op for DVT ppx then ASA. Plan Percocet, Robaxin, Colace. Plan home with HHPT post-op with family members at home for assistance. Will follow up 10-14 days post-op for staple removal and xrays.  Plan Left total knee replacement  Signed electronically by Cecilie Kicks, PA-C for Dr. Tonita Cong

## 2015-04-29 MED ORDER — DEXTROSE 5 % IV SOLN
3.0000 g | INTRAVENOUS | Status: AC
  Administered 2015-04-30: 3 g via INTRAVENOUS
  Filled 2015-04-29: qty 3000

## 2015-04-30 ENCOUNTER — Inpatient Hospital Stay (HOSPITAL_COMMUNITY): Payer: 59

## 2015-04-30 ENCOUNTER — Encounter (HOSPITAL_COMMUNITY): Payer: Self-pay | Admitting: *Deleted

## 2015-04-30 ENCOUNTER — Encounter (HOSPITAL_COMMUNITY)
Admission: RE | Disposition: A | Discharge status: Home Health | Payer: Self-pay | Source: Ambulatory Visit | Attending: Specialist

## 2015-04-30 ENCOUNTER — Inpatient Hospital Stay (HOSPITAL_COMMUNITY)
Admission: RE | Admit: 2015-04-30 | Discharge: 2015-05-03 | DRG: 470 | Disposition: A | Discharge status: Home Health | Payer: 59 | Source: Ambulatory Visit | Attending: Specialist | Admitting: Specialist

## 2015-04-30 ENCOUNTER — Inpatient Hospital Stay (HOSPITAL_COMMUNITY): Payer: 59 | Admitting: Anesthesiology

## 2015-04-30 DIAGNOSIS — E785 Hyperlipidemia, unspecified: Secondary | ICD-10-CM | POA: Diagnosis present

## 2015-04-30 DIAGNOSIS — I482 Chronic atrial fibrillation: Secondary | ICD-10-CM | POA: Diagnosis present

## 2015-04-30 DIAGNOSIS — Z79899 Other long term (current) drug therapy: Secondary | ICD-10-CM

## 2015-04-30 DIAGNOSIS — N400 Enlarged prostate without lower urinary tract symptoms: Secondary | ICD-10-CM | POA: Diagnosis present

## 2015-04-30 DIAGNOSIS — G4730 Sleep apnea, unspecified: Secondary | ICD-10-CM | POA: Diagnosis present

## 2015-04-30 DIAGNOSIS — I10 Essential (primary) hypertension: Secondary | ICD-10-CM | POA: Diagnosis present

## 2015-04-30 DIAGNOSIS — E119 Type 2 diabetes mellitus without complications: Secondary | ICD-10-CM | POA: Diagnosis present

## 2015-04-30 DIAGNOSIS — K219 Gastro-esophageal reflux disease without esophagitis: Secondary | ICD-10-CM | POA: Diagnosis present

## 2015-04-30 DIAGNOSIS — M1712 Unilateral primary osteoarthritis, left knee: Principal | ICD-10-CM | POA: Diagnosis present

## 2015-04-30 DIAGNOSIS — M25562 Pain in left knee: Secondary | ICD-10-CM | POA: Diagnosis present

## 2015-04-30 DIAGNOSIS — Z87891 Personal history of nicotine dependence: Secondary | ICD-10-CM

## 2015-04-30 DIAGNOSIS — Z6837 Body mass index (BMI) 37.0-37.9, adult: Secondary | ICD-10-CM

## 2015-04-30 DIAGNOSIS — Z96651 Presence of right artificial knee joint: Secondary | ICD-10-CM

## 2015-04-30 HISTORY — PX: TOTAL KNEE ARTHROPLASTY: SHX125

## 2015-04-30 LAB — GLUCOSE, CAPILLARY
GLUCOSE-CAPILLARY: 174 mg/dL — AB (ref 65–99)
GLUCOSE-CAPILLARY: 158 mg/dL — AB (ref 65–99)
Glucose-Capillary: 149 mg/dL — ABNORMAL HIGH (ref 65–99)

## 2015-04-30 LAB — TYPE AND SCREEN
ABO/RH(D): O POS
Antibody Screen: NEGATIVE

## 2015-04-30 SURGERY — ARTHROPLASTY, KNEE, TOTAL
Anesthesia: General | Site: Knee | Laterality: Left

## 2015-04-30 MED ORDER — ACETAMINOPHEN 650 MG RE SUPP: 650.0000 mg | Freq: Four times a day (QID) | RECTAL | Status: DC | PRN

## 2015-04-30 MED ORDER — INSULIN ASPART 100 UNIT/ML ~~LOC~~ SOLN
0.0000 [IU] | Freq: Three times a day (TID) | SUBCUTANEOUS | Status: DC
  Administered 2015-04-30 – 2015-05-01 (×2): 3 [IU] via SUBCUTANEOUS
  Administered 2015-05-01: 2 [IU] via SUBCUTANEOUS
  Administered 2015-05-01: 3 [IU] via SUBCUTANEOUS
  Administered 2015-05-02 (×2): 2 [IU] via SUBCUTANEOUS
  Administered 2015-05-02 – 2015-05-03 (×2): 3 [IU] via SUBCUTANEOUS

## 2015-04-30 MED ORDER — ROCURONIUM BROMIDE 100 MG/10ML IV SOLN
INTRAVENOUS | Status: DC | PRN
  Administered 2015-04-30: 20 mg via INTRAVENOUS

## 2015-04-30 MED ORDER — FUROSEMIDE 40 MG PO TABS
40.0000 mg | ORAL_TABLET | Freq: Once | ORAL | Status: AC
  Administered 2015-04-30: 40 mg via ORAL
  Filled 2015-04-30: qty 1

## 2015-04-30 MED ORDER — LIDOCAINE HCL (CARDIAC) 20 MG/ML IV SOLN
INTRAVENOUS | Status: DC | PRN
  Administered 2015-04-30: 100 mg via INTRAVENOUS

## 2015-04-30 MED ORDER — DABIGATRAN ETEXILATE MESYLATE 150 MG PO CAPS: 150.0000 mg | ORAL_CAPSULE | Freq: Two times a day (BID) | ORAL | Status: DC

## 2015-04-30 MED ORDER — DEXTROSE 5 % IV SOLN: 3.0000 g | Freq: Four times a day (QID) | INTRAVENOUS | Status: DC

## 2015-04-30 MED ORDER — DOCUSATE SODIUM 100 MG PO CAPS: 100.0000 mg | ORAL_CAPSULE | Freq: Two times a day (BID) | ORAL | Status: DC | PRN

## 2015-04-30 MED ORDER — MIDAZOLAM HCL 5 MG/5ML IJ SOLN
INTRAMUSCULAR | Status: DC | PRN
  Administered 2015-04-30: 1 mg via INTRAVENOUS

## 2015-04-30 MED ORDER — FUROSEMIDE 40 MG PO TABS: 40.0000 mg | ORAL_TABLET | Freq: Every day | ORAL | Status: DC

## 2015-04-30 MED ORDER — ALUM & MAG HYDROXIDE-SIMETH 200-200-20 MG/5ML PO SUSP: 30.0000 mL | ORAL | Status: DC | PRN

## 2015-04-30 MED ORDER — LACTATED RINGERS IV SOLN
INTRAVENOUS | Status: DC
  Administered 2015-04-30 (×2): via INTRAVENOUS

## 2015-04-30 MED ORDER — BISACODYL 5 MG PO TBEC: 5.0000 mg | DELAYED_RELEASE_TABLET | Freq: Every day | ORAL | Status: DC | PRN

## 2015-04-30 MED ORDER — DABIGATRAN ETEXILATE MESYLATE 150 MG PO CAPS
150.0000 mg | ORAL_CAPSULE | Freq: Two times a day (BID) | ORAL | Status: DC
  Administered 2015-05-02 – 2015-05-03 (×3): 150 mg via ORAL
  Filled 2015-04-30 (×4): qty 1

## 2015-04-30 MED ORDER — ONDANSETRON HCL 4 MG/2ML IJ SOLN: 4.0000 mg | Freq: Once | INTRAMUSCULAR | Status: DC | PRN

## 2015-04-30 MED ORDER — BUPIVACAINE-EPINEPHRINE (PF) 0.5% -1:200000 IJ SOLN
INTRAMUSCULAR | Status: DC | PRN
  Administered 2015-04-30: 30 mL via PERINEURAL

## 2015-04-30 MED ORDER — DABIGATRAN ETEXILATE MESYLATE 75 MG PO CAPS
75.0000 mg | ORAL_CAPSULE | Freq: Two times a day (BID) | ORAL | Status: AC
  Administered 2015-05-01 (×2): 75 mg via ORAL
  Filled 2015-04-30 (×2): qty 1

## 2015-04-30 MED ORDER — ONDANSETRON HCL 4 MG/2ML IJ SOLN: 4.0000 mg | Freq: Four times a day (QID) | INTRAMUSCULAR | Status: DC | PRN

## 2015-04-30 MED ORDER — ONDANSETRON HCL 4 MG/2ML IJ SOLN
INTRAMUSCULAR | Status: DC | PRN
  Administered 2015-04-30: 4 mg via INTRAVENOUS

## 2015-04-30 MED ORDER — SENNOSIDES-DOCUSATE SODIUM 8.6-50 MG PO TABS: 1.0000 | ORAL_TABLET | Freq: Every evening | ORAL | Status: DC | PRN

## 2015-04-30 MED ORDER — MIDAZOLAM HCL 2 MG/2ML IJ SOLN
INTRAMUSCULAR | Status: AC
  Filled 2015-04-30: qty 2

## 2015-04-30 MED ORDER — BUPIVACAINE-EPINEPHRINE (PF) 0.25% -1:200000 IJ SOLN
INTRAMUSCULAR | Status: AC
  Filled 2015-04-30: qty 30

## 2015-04-30 MED ORDER — METOCLOPRAMIDE HCL 5 MG/ML IJ SOLN: 5.0000 mg | Freq: Three times a day (TID) | INTRAMUSCULAR | Status: DC | PRN

## 2015-04-30 MED ORDER — MEPERIDINE HCL 50 MG/ML IJ SOLN: 6.2500 mg | INTRAMUSCULAR | Status: DC | PRN

## 2015-04-30 MED ORDER — LINAGLIPTIN 5 MG PO TABS
5.0000 mg | ORAL_TABLET | Freq: Every morning | ORAL | Status: DC
  Administered 2015-04-30 – 2015-05-03 (×4): 5 mg via ORAL
  Filled 2015-04-30 (×4): qty 1

## 2015-04-30 MED ORDER — MENTHOL 3 MG MT LOZG: 1.0000 | LOZENGE | OROMUCOSAL | Status: DC | PRN

## 2015-04-30 MED ORDER — PANTOPRAZOLE SODIUM 40 MG PO TBEC
40.0000 mg | DELAYED_RELEASE_TABLET | Freq: Every day | ORAL | Status: DC
  Administered 2015-05-01 – 2015-05-03 (×3): 40 mg via ORAL
  Filled 2015-04-30 (×3): qty 1

## 2015-04-30 MED ORDER — BUPIVACAINE-EPINEPHRINE (PF) 0.5% -1:200000 IJ SOLN
INTRAMUSCULAR | Status: AC
  Filled 2015-04-30: qty 30

## 2015-04-30 MED ORDER — ONDANSETRON HCL 4 MG PO TABS: 4.0000 mg | ORAL_TABLET | Freq: Four times a day (QID) | ORAL | Status: DC | PRN

## 2015-04-30 MED ORDER — OXYCODONE-ACETAMINOPHEN 10-325 MG PO TABS: 1.0000 | ORAL_TABLET | ORAL | Status: DC | PRN

## 2015-04-30 MED ORDER — RISAQUAD PO CAPS
1.0000 | ORAL_CAPSULE | Freq: Every day | ORAL | Status: DC
  Administered 2015-04-30 – 2015-05-03 (×4): 1 via ORAL
  Filled 2015-04-30 (×4): qty 1

## 2015-04-30 MED ORDER — CEFAZOLIN SODIUM-DEXTROSE 2-3 GM-% IV SOLR
2.0000 g | Freq: Four times a day (QID) | INTRAVENOUS | Status: AC
  Administered 2015-04-30 – 2015-05-01 (×3): 2 g via INTRAVENOUS
  Filled 2015-04-30 (×3): qty 50

## 2015-04-30 MED ORDER — METHOCARBAMOL 1000 MG/10ML IJ SOLN
500.0000 mg | Freq: Four times a day (QID) | INTRAVENOUS | Status: DC | PRN
  Filled 2015-04-30: qty 5

## 2015-04-30 MED ORDER — LISINOPRIL 40 MG PO TABS
40.0000 mg | ORAL_TABLET | Freq: Every morning | ORAL | Status: DC
  Administered 2015-04-30 – 2015-05-03 (×4): 40 mg via ORAL
  Filled 2015-04-30 (×4): qty 1

## 2015-04-30 MED ORDER — SODIUM CHLORIDE 0.9 % IR SOLN
Status: DC | PRN
  Administered 2015-04-30: 1000 mL

## 2015-04-30 MED ORDER — HYDROMORPHONE HCL 1 MG/ML IJ SOLN: 0.2500 mg | INTRAMUSCULAR | Status: DC | PRN

## 2015-04-30 MED ORDER — NEBIVOLOL HCL 10 MG PO TABS
40.0000 mg | ORAL_TABLET | Freq: Every morning | ORAL | Status: DC
  Administered 2015-05-01 – 2015-05-03 (×3): 40 mg via ORAL
  Filled 2015-04-30 (×3): qty 4

## 2015-04-30 MED ORDER — EZETIMIBE-SIMVASTATIN 10-20 MG PO TABS
1.0000 | ORAL_TABLET | Freq: Every day | ORAL | Status: DC
  Administered 2015-05-01 – 2015-05-02 (×2): 1 via ORAL
  Filled 2015-04-30 (×3): qty 1

## 2015-04-30 MED ORDER — METOCLOPRAMIDE HCL 10 MG PO TABS: 5.0000 mg | ORAL_TABLET | Freq: Three times a day (TID) | ORAL | Status: DC | PRN

## 2015-04-30 MED ORDER — DIPHENHYDRAMINE HCL 12.5 MG/5ML PO ELIX: 12.5000 mg | ORAL_SOLUTION | ORAL | Status: DC | PRN

## 2015-04-30 MED ORDER — SUCCINYLCHOLINE CHLORIDE 20 MG/ML IJ SOLN
INTRAMUSCULAR | Status: DC | PRN
  Administered 2015-04-30: 100 mg via INTRAVENOUS

## 2015-04-30 MED ORDER — DOCUSATE SODIUM 100 MG PO CAPS
100.0000 mg | ORAL_CAPSULE | Freq: Two times a day (BID) | ORAL | Status: DC
  Administered 2015-05-01 – 2015-05-03 (×5): 100 mg via ORAL
  Filled 2015-04-30 (×5): qty 1

## 2015-04-30 MED ORDER — FLUTICASONE PROPIONATE 50 MCG/ACT NA SUSP
2.0000 | Freq: Every day | NASAL | Status: DC | PRN
  Filled 2015-04-30: qty 16

## 2015-04-30 MED ORDER — ALBUTEROL SULFATE (2.5 MG/3ML) 0.083% IN NEBU
2.5000 mg | INHALATION_SOLUTION | Freq: Four times a day (QID) | RESPIRATORY_TRACT | Status: DC | PRN
  Administered 2015-05-03: 2.5 mg via RESPIRATORY_TRACT
  Filled 2015-04-30: qty 3

## 2015-04-30 MED ORDER — PROPOFOL 10 MG/ML IV BOLUS
INTRAVENOUS | Status: DC | PRN
  Administered 2015-04-30: 150 mg via INTRAVENOUS

## 2015-04-30 MED ORDER — GLYCOPYRROLATE 0.2 MG/ML IJ SOLN
INTRAMUSCULAR | Status: DC | PRN
  Administered 2015-04-30: .6 mg via INTRAVENOUS

## 2015-04-30 MED ORDER — BUPIVACAINE-EPINEPHRINE 0.25% -1:200000 IJ SOLN
INTRAMUSCULAR | Status: DC | PRN
  Administered 2015-04-30: 50 mL

## 2015-04-30 MED ORDER — FENTANYL CITRATE (PF) 250 MCG/5ML IJ SOLN
INTRAMUSCULAR | Status: AC
  Filled 2015-04-30: qty 5

## 2015-04-30 MED ORDER — PROPOFOL 10 MG/ML IV BOLUS
INTRAVENOUS | Status: AC
  Filled 2015-04-30: qty 20

## 2015-04-30 MED ORDER — NEOSTIGMINE METHYLSULFATE 10 MG/10ML IV SOLN
INTRAVENOUS | Status: DC | PRN
  Administered 2015-04-30: 4 mg via INTRAVENOUS

## 2015-04-30 MED ORDER — ACETAMINOPHEN 500 MG PO TABS: 500.0000 mg | ORAL_TABLET | Freq: Four times a day (QID) | ORAL | Status: DC | PRN

## 2015-04-30 MED ORDER — FENTANYL CITRATE (PF) 100 MCG/2ML IJ SOLN
INTRAMUSCULAR | Status: DC | PRN
  Administered 2015-04-30 (×4): 50 ug via INTRAVENOUS

## 2015-04-30 MED ORDER — METHOCARBAMOL 500 MG PO TABS
500.0000 mg | ORAL_TABLET | Freq: Four times a day (QID) | ORAL | Status: DC | PRN
  Administered 2015-04-30 – 2015-05-03 (×6): 500 mg via ORAL
  Filled 2015-04-30 (×6): qty 1

## 2015-04-30 MED ORDER — OXYCODONE HCL 5 MG PO TABS
10.0000 mg | ORAL_TABLET | ORAL | Status: DC | PRN
  Administered 2015-04-30 – 2015-05-03 (×15): 10 mg via ORAL
  Filled 2015-04-30 (×16): qty 2

## 2015-04-30 MED ORDER — METHOCARBAMOL 500 MG PO TABS: 500.0000 mg | ORAL_TABLET | Freq: Three times a day (TID) | ORAL | Status: DC | PRN

## 2015-04-30 MED ORDER — SODIUM CHLORIDE 0.9 % IR SOLN
Status: DC | PRN
  Administered 2015-04-30: 500 mL

## 2015-04-30 MED ORDER — ACETAMINOPHEN 325 MG PO TABS
650.0000 mg | ORAL_TABLET | Freq: Four times a day (QID) | ORAL | Status: DC | PRN
  Administered 2015-05-02: 650 mg via ORAL
  Filled 2015-04-30: qty 2

## 2015-04-30 MED ORDER — PHENOL 1.4 % MT LIQD: 1.0000 | OROMUCOSAL | Status: DC | PRN

## 2015-04-30 MED ORDER — POTASSIUM CHLORIDE IN NACL 20-0.9 MEQ/L-% IV SOLN
INTRAVENOUS | Status: AC
Start: 2015-04-30 — End: 2015-05-01
  Administered 2015-04-30: 15:00:00 via INTRAVENOUS
  Filled 2015-04-30 (×2): qty 1000

## 2015-04-30 MED ORDER — ALBUTEROL SULFATE HFA 108 (90 BASE) MCG/ACT IN AERS: 2.0000 | INHALATION_SPRAY | Freq: Four times a day (QID) | RESPIRATORY_TRACT | Status: DC | PRN

## 2015-04-30 MED ORDER — HYDROMORPHONE HCL 1 MG/ML IJ SOLN
1.0000 mg | INTRAMUSCULAR | Status: DC | PRN
  Administered 2015-04-30 – 2015-05-01 (×5): 1 mg via INTRAVENOUS
  Filled 2015-04-30 (×5): qty 1

## 2015-04-30 MED ORDER — SODIUM CHLORIDE 0.9 % IR SOLN
Status: AC
  Filled 2015-04-30: qty 1

## 2015-04-30 SURGICAL SUPPLY — 75 items
BAG SPEC THK2 15X12 ZIP CLS (MISCELLANEOUS)
BAG ZIPLOCK 12X15 (MISCELLANEOUS) IMPLANT
BANDAGE ELASTIC 4 VELCRO ST LF (GAUZE/BANDAGES/DRESSINGS) ×3 IMPLANT
BANDAGE ELASTIC 6 VELCRO ST LF (GAUZE/BANDAGES/DRESSINGS) ×3 IMPLANT
BANDAGE ESMARK 6X9 LF (GAUZE/BANDAGES/DRESSINGS) ×1 IMPLANT
BLADE SAG 18X100X1.27 (BLADE) ×3 IMPLANT
BLADE SAW SGTL 13.0X1.19X90.0M (BLADE) ×3 IMPLANT
BNDG CMPR 9X6 STRL LF SNTH (GAUZE/BANDAGES/DRESSINGS) ×1
BNDG ESMARK 6X9 LF (GAUZE/BANDAGES/DRESSINGS) ×3
CAP KNEE TOTAL 3 SIGMA ×2 IMPLANT
CEMENT HV SMART SET (Cement) ×6 IMPLANT
CHLORAPREP W/TINT 26ML (MISCELLANEOUS) IMPLANT
CLOSURE WOUND 1/2 X4 (GAUZE/BANDAGES/DRESSINGS) ×1
CLOTH 2% CHLOROHEXIDINE 3PK (PERSONAL CARE ITEMS) ×3 IMPLANT
CUFF TOURN SGL QUICK 34 (TOURNIQUET CUFF) ×3
CUFF TRNQT CYL 34X4X40X1 (TOURNIQUET CUFF) ×1 IMPLANT
DECANTER SPIKE VIAL GLASS SM (MISCELLANEOUS) ×3 IMPLANT
DRAPE INCISE IOBAN 66X45 STRL (DRAPES) IMPLANT
DRAPE ORTHO SPLIT 77X108 STRL (DRAPES) ×6
DRAPE POUCH INSTRU U-SHP 10X18 (DRAPES) ×3 IMPLANT
DRAPE SHEET LG 3/4 BI-LAMINATE (DRAPES) ×7 IMPLANT
DRAPE SURG ORHT 6 SPLT 77X108 (DRAPES) ×2 IMPLANT
DRAPE U-SHAPE 47X51 STRL (DRAPES) ×3 IMPLANT
DRSG ADAPTIC 3X8 NADH LF (GAUZE/BANDAGES/DRESSINGS) ×2 IMPLANT
DRSG AQUACEL AG ADV 3.5X10 (GAUZE/BANDAGES/DRESSINGS) ×2 IMPLANT
DRSG PAD ABDOMINAL 8X10 ST (GAUZE/BANDAGES/DRESSINGS) IMPLANT
DRSG TEGADERM 4X4.75 (GAUZE/BANDAGES/DRESSINGS) IMPLANT
DURAPREP 26ML APPLICATOR (WOUND CARE) ×5 IMPLANT
ELECT REM PT RETURN 9FT ADLT (ELECTROSURGICAL) ×3
ELECTRODE REM PT RTRN 9FT ADLT (ELECTROSURGICAL) ×1 IMPLANT
EVACUATOR 1/8 PVC DRAIN (DRAIN) IMPLANT
FACESHIELD WRAPAROUND (MASK) ×12 IMPLANT
FACESHIELD WRAPAROUND OR TEAM (MASK) ×5 IMPLANT
GAUZE SPONGE 2X2 8PLY STRL LF (GAUZE/BANDAGES/DRESSINGS) IMPLANT
GAUZE SPONGE 4X4 12PLY STRL (GAUZE/BANDAGES/DRESSINGS) IMPLANT
GLOVE BIOGEL PI IND STRL 7.5 (GLOVE) ×1 IMPLANT
GLOVE BIOGEL PI IND STRL 8 (GLOVE) ×1 IMPLANT
GLOVE BIOGEL PI INDICATOR 7.5 (GLOVE) ×2
GLOVE BIOGEL PI INDICATOR 8 (GLOVE) ×2
GLOVE SURG SS PI 7.5 STRL IVOR (GLOVE) ×3 IMPLANT
GLOVE SURG SS PI 8.0 STRL IVOR (GLOVE) ×6 IMPLANT
GOWN STRL REUS W/TWL XL LVL3 (GOWN DISPOSABLE) ×6 IMPLANT
HANDPIECE INTERPULSE COAX TIP (DISPOSABLE) ×3
IMMOBILIZER KNEE 20 (SOFTGOODS) ×3
IMMOBILIZER KNEE 20 THIGH 36 (SOFTGOODS) ×1 IMPLANT
KIT BASIN OR (CUSTOM PROCEDURE TRAY) ×3 IMPLANT
MANIFOLD NEPTUNE II (INSTRUMENTS) ×3 IMPLANT
NDL SAFETY ECLIPSE 18X1.5 (NEEDLE) ×1 IMPLANT
NEEDLE HYPO 18GX1.5 SHARP (NEEDLE) ×3
NS IRRIG 1000ML POUR BTL (IV SOLUTION) IMPLANT
PACK TOTAL JOINT (CUSTOM PROCEDURE TRAY) ×3 IMPLANT
PADDING CAST COTTON 6X4 STRL (CAST SUPPLIES) IMPLANT
PEN SKIN MARKING BROAD (MISCELLANEOUS) ×3 IMPLANT
POSITIONER SURGICAL ARM (MISCELLANEOUS) ×3 IMPLANT
SET HNDPC FAN SPRY TIP SCT (DISPOSABLE) ×1 IMPLANT
SPONGE GAUZE 2X2 STER 10/PKG (GAUZE/BANDAGES/DRESSINGS)
SPONGE SURGIFOAM ABS GEL 100 (HEMOSTASIS) IMPLANT
STAPLER VISISTAT (STAPLE) IMPLANT
STRIP CLOSURE SKIN 1/2X4 (GAUZE/BANDAGES/DRESSINGS) ×1 IMPLANT
SUCTION FRAZIER 12FR DISP (SUCTIONS) ×3 IMPLANT
SUT BONE WAX W31G (SUTURE) IMPLANT
SUT MNCRL AB 4-0 PS2 18 (SUTURE) IMPLANT
SUT VIC AB 1 CT1 27 (SUTURE) ×3
SUT VIC AB 1 CT1 27XBRD ANTBC (SUTURE) ×1 IMPLANT
SUT VIC AB 2-0 CT1 27 (SUTURE) ×9
SUT VIC AB 2-0 CT1 TAPERPNT 27 (SUTURE) ×3 IMPLANT
SUT VLOC 180 0 24IN GS25 (SUTURE) ×3 IMPLANT
SYR 50ML LL SCALE MARK (SYRINGE) ×3 IMPLANT
TOWEL OR 17X26 10 PK STRL BLUE (TOWEL DISPOSABLE) ×3 IMPLANT
TOWEL OR NON WOVEN STRL DISP B (DISPOSABLE) IMPLANT
TOWER CARTRIDGE SMART MIX (DISPOSABLE) ×3 IMPLANT
TRAY FOLEY W/METER SILVER 16FR (SET/KITS/TRAYS/PACK) ×2 IMPLANT
WATER STERILE IRR 1500ML POUR (IV SOLUTION) ×3 IMPLANT
WRAP KNEE MAXI GEL POST OP (GAUZE/BANDAGES/DRESSINGS) ×3 IMPLANT
YANKAUER SUCT BULB TIP 10FT TU (MISCELLANEOUS) ×3 IMPLANT

## 2015-04-30 NOTE — Progress Notes (Signed)
CPM to be started on floor.

## 2015-04-30 NOTE — Progress Notes (Signed)
X-RAY results noted 

## 2015-04-30 NOTE — H&P (View-Only) (Signed)
Dan [name] DOB: 07/14/1951 Married / Language: English / Race: White Male  H&P Date: 04/24/15  Chief Complaint: Left knee pain  History of Present Illness The patient is a 64 year old male who comes in today for a preoperative History and Physical. The patient is scheduled for a left total knee arthroplasty to be performed by Dr. Jeffrey C. Beane, MD at [facility] on 04/30/2015. Dan [name] is s/p L knee arthroscopy in June 2015, grade 3 and 4 changes. He has had worsening pain this year, temporary relief with steroid injections and viscosupplementation. His pain is interfering with ADL's at this point and he desires to proceed with surgery.  Dr. Beane and the patient mutually agreed to proceed with a left total knee replacement. Risks and benefits of the procedure were discussed including stiffness, suboptimal range of motion, persistent pain, infection requiring removal of prosthesis and reinsertion, need for prophylactic antibiotics in the future, for example, dental procedures, possible need for manipulation, revision in the future and also anesthetic complications including DVT, PE, etc. We discussed the perioperative course, time in the hospital, postoperative recovery and the need for elevation to control swelling. We also discussed the predicted range of motion and the probability that squatting and kneeling would be unobtainable in the future. In addition, postoperative anticoagulation was discussed. We have obtained preoperative medical clearance as necessary. Provided her illustrated handout and discussed it in detail. They will enroll in the total joint replacement educational forum at the hospital.  Past Medical Hx High blood pressure Sleep Apnea CPAP Cardiac Arrhythmia  Atrial Fibrillation Hemorrhoids Non-Insulin Dependent Diabetes Mellitus  Allergies No Known Drug Allergies07/04/2013  Family History  Cancer First Degree Relatives. sister No pertinent  family history Hypertension father First Degree Relatives  Social History Tobacco use Former smoker. 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 Lisinopril (40MG Tablet, Oral) Active. MetFORMIN HCl (1000MG Tablet, Oral) Active. Pradaxa (150MG Capsule, Oral) Active. Vytorin (10-20MG Tablet, Oral) Active. Januvia (50MG Tablet, Oral) Active. Furosemide (Oral) Specific dose unknown - Active. Bystolic (10MG Tablet, Oral) Active. Fluticasone Propionate (50MCG/ACT Suspension, Nasal as needed) Active. Medications Reconciled  Past Surgical History  Arthroscopic Knee Surgery - Left  Review of Systems General Not Present- Chills, Fatigue, Fever, Memory Loss, Night Sweats, Weight Gain and Weight Loss. Skin Not Present- Eczema, Hives, Itching, Lesions and Rash. HEENT Not Present- Dentures, Double Vision, Headache, Hearing Loss, Tinnitus and Visual Loss. Respiratory Not Present- Allergies, Chronic Cough, Coughing up blood, Shortness of breath at rest and Shortness of breath with exertion. Cardiovascular Not Present- Chest Pain, Difficulty Breathing Lying Down, Murmur, Palpitations, Racing/skipping heartbeats and Swelling. Gastrointestinal Not Present- Abdominal Pain, Bloody Stool, Constipation, Diarrhea, Difficulty  Swallowing, Heartburn, Jaundice, Loss of appetitie, Nausea and Vomiting. Male Genitourinary Not Present- Blood in Urine, Discharge, Flank Pain, Incontinence, Painful Urination, Urgency, Urinary frequency, Urinary Retention, Urinating at Night and Weak urinary stream. Musculoskeletal Present- Joint   Pain, Joint Swelling, Morning Stiffness and Muscle Pain. Not Present- Back Pain, Muscle Weakness and Spasms. Neurological Not Present- Blackout spells, Difficulty with balance, Dizziness, Paralysis, Tremor and Weakness. Psychiatric Not Present- Insomnia.  Physical Exam  General Mental Status -Alert, cooperative and good historian. General Appearance-pleasant, Not in acute distress. Orientation-Oriented X3. Build & Nutrition-Well nourished and Well developed. Gait-Antalgic.  Head and Neck Head-normocephalic, atraumatic . Neck Global Assessment - supple, no bruit auscultated on the right, no bruit auscultated on the left.  Eye Pupil - Bilateral-Regular and Round. Motion - Bilateral-EOMI.  Chest and Lung Exam Auscultation Breath sounds - clear at anterior chest wall and clear at posterior chest wall. Adventitious sounds - No Adventitious sounds.  Cardiovascular Auscultation Rhythm - Regular rate and rhythm. Heart Sounds - S1 WNL and S2 WNL. Murmurs & Other Heart Sounds - Auscultation of the heart reveals - No Murmurs.  Abdomen Palpation/Percussion Tenderness - Abdomen is non-tender to palpation. Rigidity (guarding) - Abdomen is soft. Auscultation Auscultation of the abdomen reveals - Bowel sounds normal.  Male Genitourinary Note: Not done, not pertinent to present illness  Musculoskeletal Note: Left Knee: Inspection and Palpation - Tenderness - medial joint line tender to palpation, no tenderness to palpation of the superior calf, no tenderness to palpation of the quadriceps tendon, no tenderness to palpation of the patellar tendon, no tenderness to palpation of the  patella, no tenderness to palpation of the lateral joint line, no tenderness to palpation of the fibular head, no tenderness to palpation of the peroneal nerve. Patellar Tendon - no pain to palpation of the patellar tendon. Swelling - periarticular swelling present. Effusion - trace. Tissue tension/texture is - soft. Pulses - 2+. Sensation - intact to light touch. Skin - Color - no ecchymosis, no erythema. Wound/Incision - Appearance - The incision is well healed with no erythema or exudates(scope portals). Strength and Tone - Quadriceps - 5/5. Hamstrings - 5/5. ROM: Flexion - AROM - 120 . Extension - AROM - 0 . Stability - Valgus Laxity at 30 - None. Valgus Laxity at 0 - None. Varus Laxity at 30 - None. Varus Laxity at 0 - None. Lachman - Negative. Anterior Drawer Test - Negative. Posterior Drawer Test - Negative. Left Knee - Deformities/Malalignments/Discrepancies - no deformities noted. Special Tests - McMurray Test (lateral) - negative. McMurray Test (medial) - negative. Patellar Compression Pain - no pain. Ankle/Foot: Foot - Deformities/Malalignments/Discrepancies - Note: pes planus bilaterally.  Imaging Xrays from 12/2014 reviewed. There is progressive joint space narrowing since previous xrays in 04/2013, now with collapse of the medial joint space, bone-on-bone arthrosis, slight varus angulation. There are mild degenerative changes noted lateral and PF joint spaces as well. Right knee with mild degenerative changes.  Assessment & Plan Primary osteoarthritis of left knee (M17.12)  Pt with end-stage left knee DJD, bone-on-bone, refractory to conservative tx, scheduled for left total knee replacement by Dr. Beane. We again discussed the procedure itself as well as risks, complications and alternatives, including but not limited to DVT, PE, infx, bleeding, failure of procedure, need for secondary procedure including manipulation, nerve injury, ongoing pain/symptoms, anesthesia risk, even stroke or  death. Also discussed typical post-op protocols, activity restrictions, need for PT, flexion/extension exercises, time out of work. Discussed need for DVT ppx post-op with Xarelto then ASA per protocol. Discussed dental ppx. Also discussed limitations post-operatively such as kneeling and squatting. All questions were answered. Patient desires to proceed with surgery as scheduled. Will hold Pradaxa accordingly. Will remain NPO after MN   night before surgery. Will present to [facility] for pre-op testing. Plan Xarelto 2 weeks post-op for DVT ppx then ASA. Plan Percocet, Robaxin, Colace. Plan home with HHPT post-op with family members at home for assistance. Will follow up 10-14 days post-op for staple removal and xrays.  Plan Left total knee replacement  Signed electronically by Yer Castello M Hashir Deleeuw, PA-C for Dr. Beane  

## 2015-04-30 NOTE — Transfer of Care (Signed)
Immediate Anesthesia Transfer of Care Note  Patient: Dan [name]  Procedure(s) Performed: Procedure(s): LEFT TOTAL KNEE ARTHROPLASTY (Left)  Patient Location: PACU  Anesthesia Type:General  Level of Consciousness:  sedated, patient cooperative and responds to stimulation  Airway & Oxygen Therapy:Patient Spontanous Breathing and Patient connected to face mask oxgen  Post-op Assessment:  Report given to PACU RN and Post -op Vital signs reviewed and stable  Post vital signs:  Reviewed and stable  Last Vitals:  Filed Vitals:   04/30/15 0658  BP: 149/105  Pulse: 70  Temp: 36.7 C  Resp: 18    Complications: No apparent anesthesia complications

## 2015-04-30 NOTE — Anesthesia Postprocedure Evaluation (Signed)
Anesthesia Post Note  Patient: Dan [name]  Procedure(s) Performed: Procedure(s) (LRB): LEFT TOTAL KNEE ARTHROPLASTY (Left)  Anesthesia type: general  Patient location: PACU  Post pain: Pain level controlled  Post assessment: Patient's Cardiovascular Status Stable  Last Vitals:  Filed Vitals:   04/30/15 1315  BP: 143/86  Pulse: 68  Temp:   Resp: 25    Post vital signs: Reviewed and stable  Level of consciousness: sedated  Complications: No apparent anesthesia complications

## 2015-04-30 NOTE — Discharge Instructions (Signed)
Elevate leg above heart 6x a day for 15minutes each Use knee immobilizer while walking until can SLR x 10 Use knee immobilizer in bed to keep knee in extension Aquacel dressing may remain in place until follow up. May shower with aquacel dressing in place. If the dressing becomes saturated or peels off, you may remove aquacel dressing. Place new dressing with gauze and tape or ACE bandage which should be kept clean and dry and changed daily. Follow up with Dr. Tonita Cong 2 weeks post-op

## 2015-04-30 NOTE — Progress Notes (Signed)
Portable AP and Lateral Left Knee X-rays done. 

## 2015-04-30 NOTE — Op Note (Signed)
[name], [name] NO.:  [id number]  MEDICAL RECORD NO.:  [id number]  LOCATION:  22                         FACILITY:  [facility]  PHYSICIAN:  Susa Day, M.D.    DATE OF BIRTH:  [birth year]  DATE OF PROCEDURE:  04/30/2015 DATE OF DISCHARGE:                              OPERATIVE REPORT   PREOPERATIVE DIAGNOSIS:  End-stage degenerative joint disease (DJD) bone- on-bone, left knee.  POSTOPERATIVE DIAGNOSIS:  End-stage degenerative joint disease (DJD) bone-on-bone, left knee.  PROCEDURE PERFORMED:  Left total knee arthroplasty.  COMPONENTS:  DePuy rotating platform, 5 femur, 5 tibia, 10 mm insert, 41 patella.  ANESTHESIA:  General.  ASSISTANT:  Dan Dunker, PA  HISTORY:  A 64 year old male, with bone-on-bone arthrosis.  Fracture rest, activity modification, corticosteroid injections, arthroscopic debridement bone-on-bone in the upright weightbearing position.  He has severe pain, disabling pain, and his job is to stand all day.  He was indicated for placement of the degenerated knee.  Risks and benefits were discussed including bleeding, infection, damage to neurovascular structures, DVT, PE, anesthetic complications, etc.  TECHNIQUE:  The patient in supine position, after induction of adequate anesthesia 2 g Kefzol.  The lower extremities were prepped, draped and exsanguinated in usual sterile fashion.  Thigh tourniquet inflated to 300 mmHg.  Midline incision was made over the patella.  Full-thickness flaps developed.  Median parapatellar arthrotomy performed.  Copious portions of clear synovial fluid were evacuated.  Patella everted.  Knee flexed.  Tricompartmental osteoarthrosis particularly in the medial compartment was noted.  Medial and lateral remnants of the menisci were removed.  We elevated soft tissues medially preserving the MCL.  We removed the ACL.  Notch was placed in the femur.  Step drill utilized to enter the femoral canal, was  irrigated, 5-degree left placed with 10 off the distal femur.  This was pinned, cut, and performed.  Soft tissues protected.  We sized him up the anterior cortex to be and exactly of 5, this was pinned in 3 degrees of external rotation.  Our cutting block performed the anterior, posterior, and chamfer cuts.  No notch in the cortex occurred.  Then turned our attention towards the tibia subluxed. Defect was medially 4 off the medial defect was measured.  External alignment guide, bisecting tibiotalar joint slight slope, parallel to the shaft.  This was pinned.  I performed the proximal tibial cut.  We then protected the soft tissues posteriorly.  We checked our flexion- extension gaps, they were equivalent.  We then turned our attention towards completing the tibia, it was subluxed.  Retractors were used to maximize the surface with a 5 trial plate, this was pinned in the appropriate rotation just to the medial aspect of the tibial tubercle. We placed our central drill punch guide.  Turned our attention towards completing the femur.  Box cut jig applied and flushed to the lateral condyle, lateral ridge of the condyle, bisecting the canal, pinned, performed our box cut, without difficulty.  Removed that, placed a trial femur and tibia and a 10 mm insert, had full extension, full flexion, good stability, varus and valgus stressing 0-30 degrees.  Then turned our attention towards the patella, it was sized  to a 26.  We planned it to a 16 and a fairly large patellar surface using a patellar jig.  We then drilled our PEG holes medializing and chose a 41 medializing the patellar button.  We then placed a trial patella with excellent patellofemoral tracking.  All trials were removed.  We checked posteriorly, the popliteus was intact, cauterized the geniculates. Loose body was removed.  All cuts were satisfactory, copiously irrigated the wound with pulsatile lavage, mixed cement on back table  in appropriate fashion.  Flexed the knee, used retractors, and all surfaces thoroughly dried.  We injected cement in the proximal tibia and digitally pressurizing it, impacted our 5 tibial tray, redundant cement removed.  We cemented the femur 5, impacted it, and redundant cement removed.  Placed a 5 x 10 trial, reduced the knee, and held an axial load on the knee throughout the curing of the cement, redundant cement removed.  We cemented the patella.  After curing of the cement, we ranged the knee.  Excellent range, full extension and flexion and excellent stability and patellofemoral tracking.  We therefore selected the 10 insert as the optimal insert.  We then removed this and then meticulously removed any redundant cement.  After this, we copiously irrigated the wound with pulsatile lavage and antibiotic irrigation. Placed a 10 permanent insert, rotating platform.  It was reduced.  We had full extension, full flexion, good stability, varus and valgus stressing 0-30 degrees, and excellent patellofemoral tracking. Tourniquet was deflated to 65 minutes.  We had placed 0.25% Marcaine with epinephrine in the joint for hemostasis.  Bone wax on the cancellous surfaces.  We meticulously cauterized any bleeders after the tourniquet was deflated, thoroughly dried.  We therefore reapproximated the patellar tendon.  Slight flexion with #1 Vicryl in a figure-of-eight sutures and then a running V-Loc.  We had flexion to gravity 90 degrees. He had full flexion, full extension, good stability to varus valgus stressing 0-30 degrees.  Copiously irrigated the wound.  Subcu with 2-0 and skin with staples.  The wound was dressed sterilely.  Placed an Ace, extubated, and transported to the recovery in satisfactory condition.  The patient tolerated the procedure well.  No complications.  Assistant, Dan [name], [place].  Minimal blood loss.     Susa Day, M.D.     Dan [name]  D:  04/30/2015  T:   04/30/2015  Job:  [id number]

## 2015-04-30 NOTE — Progress Notes (Signed)
Dr. Conrad [place] in to see patient- made aware of patient's SA02s- and that CPAP being started

## 2015-04-30 NOTE — Anesthesia Procedure Notes (Addendum)
Anesthesia Regional Block:  Femoral nerve block  Pre-Anesthetic Checklist: ,, timeout performed, Correct Patient, Correct Site, Correct Laterality, Correct Procedure, Correct Position, site marked, Risks and benefits discussed,  Surgical consent,  Pre-op evaluation,  At surgeon's request and post-op pain management  Laterality: Left  Prep: chloraprep       Needles:  Injection technique: Single-shot  Needle Type: Echogenic Stimulator Needle     Needle Length: 9cm 9 cm Needle Gauge: 21 and 21 G    Additional Needles:  Procedures: ultrasound guided (picture in chart) and nerve stimulator Femoral nerve block  Nerve Stimulator or Paresthesia:  Response: 0.4 mA,   Additional Responses:   Narrative:  Start time: 04/30/2015 9:30 AM End time: 04/30/2015 9:45 AM Injection made incrementally with aspirations every 5 mL.  Performed by: Personally  Anesthesiologist: Lillia Abed  Additional Notes: Monitors applied. Patient sedated. Sterile prep and drape,hand hygiene and sterile gloves were used. Relevant anatomy identified.Needle position confirmed.Local anesthetic injected incrementally after negative aspiration. Local anesthetic spread visualized around nerve(s). Vascular puncture avoided. No complications. Image printed for medical record.The patient tolerated the procedure well.        Procedure Name: Intubation Date/Time: 04/30/2015 10:21 AM Performed by: Anne Fu Pre-anesthesia Checklist: Patient identified, Emergency Drugs available, Suction available, Patient being monitored and Timeout performed Patient Re-evaluated:Patient Re-evaluated prior to inductionOxygen Delivery Method: Circle system utilized Preoxygenation: Pre-oxygenation with 100% oxygen Intubation Type: IV induction Ventilation: Mask ventilation without difficulty Laryngoscope Size: Mac and 4 Grade View: Grade I Tube type: Oral Tube size: 7.5 mm Number of attempts: 1 Airway Equipment and Method:  Stylet Placement Confirmation: ETT inserted through vocal cords under direct vision,  positive ETCO2,  CO2 detector and breath sounds checked- equal and bilateral Secured at: 22 cm Tube secured with: Tape Dental Injury: Teeth and Oropharynx as per pre-operative assessment

## 2015-04-30 NOTE — Progress Notes (Signed)
PT Cancellation Note  Patient Details Name: BRONX BROGDEN MRN: [id number] DOB: 1951/02/17   Cancelled Treatment:    Reason Eval/Treat Not Completed: Other (comment) (in CPM- had femoral nerve block)   Claretha Cooper 04/30/2015, 4:38 PM

## 2015-04-30 NOTE — Interval H&P Note (Signed)
History and Physical Interval Note:  04/30/2015 9:23 AM  Dan [name]  has presented today for surgery, with the diagnosis of left knee djd   The various methods of treatment have been discussed with the patient and family. After consideration of risks, benefits and other options for treatment, the patient has consented to  Procedure(s): LEFT TOTAL KNEE ARTHROPLASTY (Left) as a surgical intervention .  The patient's history has been reviewed, patient examined, no change in status, stable for surgery.  I have reviewed the patient's chart and labs.  Questions were answered to the patient's satisfaction.     Yogesh Cominsky C

## 2015-04-30 NOTE — Progress Notes (Signed)
Dr. Ossey in to see patient- O.K. To go to floor. 

## 2015-04-30 NOTE — Anesthesia Preprocedure Evaluation (Signed)
Anesthesia Evaluation  Patient identified by MRN, date of birth, ID band Patient awake    Reviewed: Allergy & Precautions, NPO status , Patient's Chart, lab work & pertinent test results  Airway Mallampati: I  TM Distance: >3 FB Neck ROM: Full    Dental   Pulmonary sleep apnea and Continuous Positive Airway Pressure Ventilation , former smoker,    Pulmonary exam normal       Cardiovascular hypertension, Pt. on medications Normal cardiovascular exam    Neuro/Psych    GI/Hepatic GERD-  Medicated and Controlled,  Endo/Other  diabetes, Type 2, Oral Hypoglycemic Agents  Renal/GU      Musculoskeletal   Abdominal   Peds  Hematology   Anesthesia Other Findings   Reproductive/Obstetrics                             Anesthesia Physical Anesthesia Plan  ASA: III  Anesthesia Plan: General   Post-op Pain Management: GA combined w/ Regional for post-op pain   Induction: Intravenous  Airway Management Planned: LMA  Additional Equipment:   Intra-op Plan:   Post-operative Plan: Extubation in OR  Informed Consent: I have reviewed the patients History and Physical, chart, labs and discussed the procedure including the risks, benefits and alternatives for the proposed anesthesia with the patient or authorized representative who has indicated his/her understanding and acceptance.     Plan Discussed with: CRNA and Surgeon  Anesthesia Plan Comments:         Anesthesia Quick Evaluation

## 2015-04-30 NOTE — Progress Notes (Signed)
AssistedDr. Ossey} with left, femoral block. Side rails up, monitors on throughout procedure. See vital signs in flow sheet. Tolerated Procedure well.

## 2015-04-30 NOTE — Progress Notes (Signed)
Utilization review completed.  

## 2015-04-30 NOTE — Progress Notes (Signed)
Lasix 40 mg P.O given as ordered.

## 2015-04-30 NOTE — Brief Op Note (Signed)
04/30/2015  11:21 AM  PATIENT:  Dan [name]  64 y.o. male  PRE-OPERATIVE DIAGNOSIS:  left knee degenerative joint disease  POST-OPERATIVE DIAGNOSIS:  left knee degenerative joint disease  PROCEDURE:  Procedure(s): LEFT TOTAL KNEE ARTHROPLASTY (Left)  SURGEON:  Surgeon(s) and Role:    * Susa Day, MD - Primary  PHYSICIAN ASSISTANT:   ASSISTANTS: [place]   ANESTHESIA:   general  EBL:     BLOOD ADMINISTERED:none  DRAINS: none   LOCAL MEDICATIONS USED:  MARCAINE     SPECIMEN:  No Specimen  DISPOSITION OF SPECIMEN:  N/A  COUNTS:  YES  TOURNIQUET:   Total Tourniquet Time Documented: Thigh (Left) - 65 minutes Total: Thigh (Left) - 65 minutes   DICTATION: .Other Dictation: Dictation Number  [id number]  PLAN OF CARE: Admit to inpatient   PATIENT DISPOSITION:  PACU - hemodynamically stable.   Delay start of Pharmacological VTE agent (>24hrs) due to surgical blood loss or risk of bleeding: no

## 2015-05-01 ENCOUNTER — Encounter (HOSPITAL_COMMUNITY): Payer: Self-pay | Admitting: Specialist

## 2015-05-01 LAB — CBC
HCT: 41.5 % (ref 39.0–52.0)
Hemoglobin: 13 g/dL (ref 13.0–17.0)
MCH: 28 pg (ref 26.0–34.0)
MCHC: 31.3 g/dL (ref 30.0–36.0)
MCV: 89.2 fL (ref 78.0–100.0)
PLATELETS: 187 10*3/uL (ref 150–400)
RBC: 4.65 MIL/uL (ref 4.22–5.81)
RDW: 14.5 % (ref 11.5–15.5)
WBC: 6.3 10*3/uL (ref 4.0–10.5)

## 2015-05-01 LAB — BASIC METABOLIC PANEL
ANION GAP: 7 (ref 5–15)
BUN: 14 mg/dL (ref 6–20)
CALCIUM: 9.1 mg/dL (ref 8.9–10.3)
CHLORIDE: 98 mmol/L — AB (ref 101–111)
CO2: 30 mmol/L (ref 22–32)
CREATININE: 0.95 mg/dL (ref 0.61–1.24)
GFR calc Af Amer: >60 mL/min (ref >60)
Glucose, Bld: 153 mg/dL — ABNORMAL HIGH (ref 65–99)
Potassium: 3.8 mmol/L (ref 3.5–5.1)
SODIUM: 135 mmol/L (ref 135–145)

## 2015-05-01 LAB — GLUCOSE, CAPILLARY
Glucose-Capillary: 153 mg/dL — ABNORMAL HIGH (ref 65–99)
Glucose-Capillary: 126 mg/dL — ABNORMAL HIGH (ref 65–99)
Glucose-Capillary: 192 mg/dL — ABNORMAL HIGH (ref 65–99)

## 2015-05-01 NOTE — Care Management Note (Addendum)
Case Management Note  Patient Details  Name: Dan [name] MRN: [id number] Date of Birth: Sep 02, 1951  Subjective/Objective:     64 y.o. M admitted for L TKA  04/30/15. PT recommending HHPT, RW and 3:1. Pt  States that he has a RW and that wife will bring it in the AM for PT to evaluate. States that Bathroom is 10 steps from bed and will not need BSC. States he has built in Civil [employer], contracting. Lives in private residence with spouse.[facility] rep Corliss Blacker has arranged for initiation of [name] services at the time of discharge. Will continue to follow for final disposition.                Action/Plan: CM has arranged with [facility] ([name]) to have RW delivered to room 1536 prior to discharge on 05/02/2015. No further discharge needs at present.     Expected Discharge Date:                  Expected Discharge Plan:     In-House Referral:     Discharge planning Services  CM Consult  Post Acute Care Choice:  Choice offered to:     DME Arranged:  DME Agency:  HH Arranged:  PT HH Agency:  [place]  Status of Service:  In process, will continue to follow  Medicare Important Message Given:    Date Medicare IM Given:    Medicare IM give by:    Date Additional Medicare IM Given:    Additional Medicare Important Message give by:     If discussed at [place] of Stay Meetings, dates discussed:    Additional Comments:  Delrae Sawyers, RN 05/01/2015, 11:32 AM

## 2015-05-01 NOTE — Evaluation (Addendum)
Occupational Therapy Evaluation Patient Details Name: Dan [name] MRN: [id number] DOB: 09-09-1951 Today's Date: 05/01/2015    History of Present Illness Dan [name]   Clinical Impression   This 64 year old man was admitted for the above surgery.  He was independent with adls prior to admission.  Pt currently needs mod A +2 for safety for mobility and up to max A +2 for adls.  Family will assist with adls at home.  Will follow in acute to continue education for bathroom transfers and safety with adls.    Follow Up Recommendations  No OT follow up;Supervision/Assistance - 24 hour    Equipment Recommendations  3 in 1 bedside comode    Recommendations for Other Services       Precautions / Restrictions Precautions Precautions: Knee;Fall Required Braces or Orthoses: Knee Immobilizer - Left Knee Immobilizer - Left: Discontinue once straight leg raise with < 10 degree lag Restrictions Other Position/Activity Restrictions: WBAT      Mobility Bed Mobility               General bed mobility comments: oob  Transfers   Equipment used: Rolling walker (2 wheeled) Transfers: Sit to/from Stand Sit to Stand: Mod assist         General transfer comment: cues for UE/LE placement    Balance                                            ADL Overall ADL's : Needs assistance/impaired     Grooming: Set up   Upper Body Bathing: Set up;Sitting   Lower Body Bathing: Moderate assistance;+2 for physical assistance;Sit to/from stand   Upper Body Dressing : Set up;Sitting   Lower Body Dressing: Maximal assistance;+2 for physical assistance   Toilet Transfer: Moderate assistance;+2 for physical assistance   Toileting- Clothing Manipulation and Hygiene: Minimal assistance;+2 for physical assistance;Sit to/from stand         General ADL Comments: mod A +2 to stand.  Pt used urinal sitting with set up sitting.  Family can assist with ADLs     Vision      Perception     Praxis      Pertinent Vitals/Pain Pain Score: 4  Pain Location: Dan knee Pain Descriptors / Indicators: Aching Pain Intervention(s): Limited activity within patient's tolerance     Hand Dominance     Extremity/Trunk Assessment Upper Extremity Assessment Upper Extremity Assessment: Overall WFL for tasks assessed           Communication Communication Communication: No difficulties   Cognition Arousal/Alertness: Awake/alert Behavior During Therapy: WFL for tasks assessed/performed Overall Cognitive Status: Unsure of baseline  Area of Impairment: Attention               General Comments: pt very distractible with any noise around.  Needed max cues for sequenceing steps and using arms during ambulation   General Comments       Exercises       Shoulder Instructions      Home Living Family/patient expects to be discharged to:: Private residence Living Arrangements: Spouse/significant other Available Help at Discharge: Family               Bathroom Shower/Tub: Walk-in Psychologist, prison and probation services: Standard     Home Equipment: Shower seat - built in   Additional Comments: wife and 18 year  old son at home      Prior Functioning/Environment Level of Independence: Independent             OT Diagnosis: Generalized weakness;Acute pain   OT Problem List: Decreased strength;Decreased activity tolerance;Decreased cognition;Decreased knowledge of use of DME or AE;Pain   OT Treatment/Interventions: Self-care/ADL training;DME and/or AE instruction;Patient/family education    OT Goals(Current goals can be found in the care plan section) Acute Rehab OT Goals Patient Stated Goal: Resume previous lifestyle with decreased pain OT Goal Formulation: With patient Time For Goal Achievement: 05/08/15 Potential to Achieve Goals: Good ADL Goals Pt Will Perform Grooming: with supervision;standing Pt Will Transfer to Toilet: with min guard  assist;bedside commode;ambulating Pt Will Perform Tub/Shower Transfer: Shower transfer;with min guard assist;shower seat;ambulating Additional ADL Goal #1: family will safely assist pt with LB adls  OT Frequency: Min 2X/week   Barriers to D/C:            Co-evaluation PT/OT/SLP Co-Evaluation/Treatment: Yes Reason for Co-Treatment: For patient/therapist safety PT goals addressed during session: Mobility/safety with mobility OT goals addressed during session: ADL's and self-care      End of Session    Activity Tolerance: Patient tolerated treatment well Patient left: in bed;with call bell/phone within reach   Time: 1411-1435 OT Time Calculation (min): 24 min Charges:  OT General Charges $OT Visit: 1 Procedure OT Evaluation $Initial OT Evaluation Tier I: 1 Procedure G-Codes:    Dan [name] May 03, 2015, 3:19 PM   Lesle Chris, OTR/Dan [phone] 2015/05/03

## 2015-05-01 NOTE — Progress Notes (Signed)
CSW consulted for SNF placement. PN reviewed. Pt is planning to return home with HHPT following hospital d/c. RNCM will assist with d/c planning.   Werner Lean LCSW [phone]

## 2015-05-01 NOTE — Progress Notes (Signed)
Physical Therapy Treatment Patient Details Name: Dan [name] MRN: [id number] DOB: [birth year] Today's Date: 05/01/2015    History of Present Illness [name]    PT Comments    Pt progressing slowly with mobility and demonstrating decreased ability to focus on task and follow cues - ? MEDS  Follow Up Recommendations  Home health PT     Equipment Recommendations  Rolling walker with 5" wheels    Recommendations for Other Services OT consult     Precautions / Restrictions Precautions Precautions: Knee;Fall Required Braces or Orthoses: Knee Immobilizer - Left Knee Immobilizer - Left: Discontinue once straight leg raise with < 10 degree lag Restrictions Weight Bearing Restrictions: No Other Position/Activity Restrictions: WBAT    Mobility  Bed Mobility Overal bed mobility: Needs Assistance Bed Mobility: Sit to Supine       Sit to supine: Mod assist   General bed mobility comments: oob  Transfers Overall transfer level: Needs assistance Equipment used: Rolling walker (2 wheeled) Transfers: Sit to/from Stand Sit to Stand: Mod assist         General transfer comment: cues for UE/LE placement  Ambulation/Gait Ambulation/Gait assistance: +2 safety/equipment;Min assist;Mod assist Ambulation Distance (Feet): 58 Feet Assistive device: Rolling walker (2 wheeled) Gait Pattern/deviations: Step-to pattern;Decreased step length - right;Decreased stance time - left;Shuffle;Trunk flexed Gait velocity: decr   General Gait Details: cues for sequence, step to gait, posture and position from [employer]            Wheelchair Mobility    Modified Rankin (Stroke Patients Only)       Balance                                    Cognition Arousal/Alertness: Awake/alert Behavior During Therapy: WFL for tasks assessed/performed Overall Cognitive Status: Impaired/Different from baseline Area of Impairment: Attention               General  Comments: pt very distractible with any noise around.  Needed max cues for sequenceing steps and using arms during ambulation    Exercises Total Joint Exercises Ankle Circles/Pumps: Both;15 reps;Supine;AAROM;AROM Quad Sets: Both;10 reps;Supine Heel Slides: AAROM;Left;15 reps;Supine Straight Leg Raises: AAROM;Left;10 reps;Supine    General Comments        Pertinent Vitals/Pain Pain Assessment: 0-10 Pain Score: 4  Pain Location: L knee Pain Descriptors / Indicators: Aching Pain Intervention(s): Limited activity within patient's tolerance    Home Living Family/patient expects to be discharged to:: Private residence Living Arrangements: Spouse/significant other Available Help at Discharge: Family         Home Equipment: Shower seat - built in Additional Comments: wife and 21 year old son at home    Prior Function Level of Independence: Independent          PT Goals (current goals can now be found in the care plan section) Acute Rehab PT Goals Patient Stated Goal: Resume previous lifestyle with decreased pain PT Goal Formulation: With patient Time For Goal Achievement: 05/07/15 Potential to Achieve Goals: Good Progress towards PT goals: Progressing toward goals    Frequency  7X/week    PT Plan Current plan remains appropriate    Co-evaluation PT/OT/SLP Co-Evaluation/Treatment: Yes Reason for Co-Treatment: For patient/therapist safety PT goals addressed during session: Mobility/safety with mobility OT goals addressed during session: ADL's and self-care     End of Session Equipment Utilized During Treatment: Gait belt;Left knee immobilizer  Activity Tolerance: Patient tolerated treatment well Patient left: in bed;with call bell/phone within reach     Time: 0919-8022 PT Time Calculation (min) (ACUTE ONLY): 25 min  Charges:  $Gait Training: 8-22 mins                    G Codes:      Drury Ardizzone 05-03-2015, 4:39 PM

## 2015-05-01 NOTE — Progress Notes (Addendum)
Patient ID: Dan [name], male   DOB: [birth year], 64 y.o.   MRN: [id number] Subjective: 1 Day Post-Op Procedure(s) (LRB): LEFT TOTAL KNEE ARTHROPLASTY (Left) Patient reports pain as mild.  Just received pain meds prior to my arrival. Feels walking with PT went well yesterday.   Patient has complaints of L knee pain  We will start therapy today. Plan is to go home with HHPT after hospital stay.  Objective: Vital signs in last 24 hours: Temp:  [97.5 F (36.4 C)-100 F (37.8 C)] 99.4 F (37.4 C) (07/29 0600) Pulse Rate:  [62-90] 81 (07/29 0600) Resp:  [13-26] 13 (07/29 0600) BP: (121-152)/(69-96) 130/80 mmHg (07/29 0600) SpO2:  [84 %-98 %] 85 % (07/29 0600)  Intake/Output from previous day:  Intake/Output Summary (Last 24 hours) at 05/01/15 0837 Last data filed at 05/01/15 0700  Gross per 24 hour  Intake 3705.25 ml  Output   2425 ml  Net 1280.25 ml    Intake/Output this shift:    Labs: Results for orders placed or performed during the hospital encounter of 04/30/15  Glucose, capillary  Result Value Ref Range   Glucose-Capillary 149 (H) 65 - 99 mg/dL   Comment 1 Notify RN   Glucose, capillary  Result Value Ref Range   Glucose-Capillary 174 (H) 65 - 99 mg/dL   Comment 1 Notify RN    Comment 2 Document in Chart   CBC  Result Value Ref Range   WBC 6.3 4.0 - 10.5 K/uL   RBC 4.65 4.22 - 5.81 MIL/uL   Hemoglobin 13.0 13.0 - 17.0 g/dL   HCT 41.5 39.0 - 52.0 %   MCV 89.2 78.0 - 100.0 fL   MCH 28.0 26.0 - 34.0 pg   MCHC 31.3 30.0 - 36.0 g/dL   RDW 14.5 11.5 - 15.5 %   Platelets 187 150 - 400 K/uL  Basic metabolic panel  Result Value Ref Range   Sodium 135 135 - 145 mmol/L   Potassium 3.8 3.5 - 5.1 mmol/L   Chloride 98 (L) 101 - 111 mmol/L   CO2 30 22 - 32 mmol/L   Glucose, Bld 153 (H) 65 - 99 mg/dL   BUN 14 6 - 20 mg/dL   Creatinine, Ser 0.95 0.61 - 1.24 mg/dL   Calcium 9.1 8.9 - 10.3 mg/dL   GFR calc non Af Amer >60 >60 mL/min   GFR calc Af Amer >60 >60 mL/min    Anion gap 7 5 - 15  Glucose, capillary  Result Value Ref Range   Glucose-Capillary 158 (H) 65 - 99 mg/dL   Comment 1 Notify RN    Comment 2 Document in Chart   Glucose, capillary  Result Value Ref Range   Glucose-Capillary 153 (H) 65 - 99 mg/dL    Exam - Neurologically intact ABD soft Neurovascular intact Sensation intact distally Intact pulses distally Dorsiflexion/Plantar flexion intact Incision: dressing C/D/I and scant drainage No cellulitis present Compartment soft no sign of DVT Dressing - clean, dry, minimal dried blood drainage on dressing Motor function intact - moving foot and toes well on exam.   Assessment/Plan: 1 Day Post-Op Procedure(s) (LRB): LEFT TOTAL KNEE ARTHROPLASTY (Left)  Advance diet Up with therapy D/C IV fluids Past Medical History  Diagnosis Date  . Chronic atrial fibrillation   . Hypertension   . Hyperlipidemia   . Morbid obesity   . BPH (benign prostatic hypertrophy)   . Diabetes mellitus   . Sleep apnea     CPAP nightly  .  Colon polyps     adenomatous  . Diverticulosis   . Hemorrhoids   . Dysrhythmia     tx. Pradaxa- Dr. Martinique follows  . GERD (gastroesophageal reflux disease)   . Arthritis     osteoarthritis left knee    DVT Prophylaxis - Pradaxa (already takes for A.Fib)- Dr. Tonita Cong discussed with pharmacy, 6m BID today then start 1570mBID tomorrow, which he has at home and can continue to use his home supply on D/C Weight-Bearing as tolerated to L leg- immobilizer in place when weightbearing until can do 10 SLR's No vaccines. Will plan D/C home with HHPT when ready, likely Sunday, depending on pain and progress with PT Will discuss with Dr. BeMliss FritzJAConley Rolls7/29/2016, 8:37 AM

## 2015-05-01 NOTE — Progress Notes (Signed)
Physical Therapy Evaluation Patient Details Name: Dan [name] MRN: [id number] DOB: Oct 08, 1950 Today's Date: 05/01/2015   History of Present Illness  [name]  Clinical Impression  Pt s/p [name] presents with decreased L LE strength/ROM, post op pain and obesity limiting functional mobility.  Pt should progress to dc home with family assist and HHPT follow up.    Follow Up Recommendations Home health PT    Equipment Recommendations  Rolling walker with 5" wheels    Recommendations for Other Services OT consult     Precautions / Restrictions Precautions Precautions: Knee;Fall Required Braces or Orthoses: Knee Immobilizer - Left Knee Immobilizer - Left: Discontinue once straight leg raise with < 10 degree lag Restrictions Weight Bearing Restrictions: No Other Position/Activity Restrictions: WBAT      Mobility  Bed Mobility Overal bed mobility: Needs Assistance;+2 for physical assistance Bed Mobility: Supine to Sit     Supine to sit: Min assist;Mod assist;+2 for physical assistance;+2 for safety/equipment     General bed mobility comments: cues for sequence and use of R LE to self assist.  Physical assist to manage L LE and to bring trunk to upright  Transfers Overall transfer level: Needs assistance Equipment used: Rolling walker (2 wheeled) Transfers: Sit to/from Stand Sit to Stand: Total assist;+2 safety/equipment;Min assist;Mod assist;From elevated surface         General transfer comment: cues for LE management and use of UEs to self assist  Ambulation/Gait Ambulation/Gait assistance: Mod assist;+2 safety/equipment Ambulation Distance (Feet): 27 Feet Assistive device: Rolling walker (2 wheeled) Gait Pattern/deviations: Step-to pattern;Decreased step length - right;Decreased stance time - left;Shuffle;Trunk flexed Gait velocity: decr   General Gait Details: cues for sequence, step to gait, posture and position from [employer]            Wheelchair  Mobility    Modified Rankin (Stroke Patients Only)       Balance                                             Pertinent Vitals/Pain Pain Assessment: 0-10 Pain Score: 4  Pain Location: L knee Pain Descriptors / Indicators: Aching;Sore Pain Intervention(s): Limited activity within patient's tolerance;Monitored during session;Premedicated before session;Ice applied    Home Living Family/patient expects to be discharged to:: Private residence Living Arrangements: Spouse/significant other Available Help at Discharge: Family Type of Home: House Home Access: Stairs to enter Entrance Stairs-Rails: Right Entrance Stairs-Number of Steps: 2 Home Layout: Able to live on main level with bedroom/bathroom Home Equipment: None      Prior Function Level of Independence: Independent               Hand Dominance        Extremity/Trunk Assessment   Upper Extremity Assessment: Overall WFL for tasks assessed           Lower Extremity Assessment: LLE deficits/detail   LLE Deficits / Details: 2-/5 quads with AAROM at knee -10- 55     Communication   Communication: No difficulties  Cognition Arousal/Alertness: Awake/alert Behavior During Therapy: WFL for tasks assessed/performed Overall Cognitive Status: Within Functional Limits for tasks assessed                      General Comments      Exercises Total Joint Exercises Ankle Circles/Pumps: AROM;Both;15 reps;Supine Quad Sets: AROM;Both;10  reps;Supine Heel Slides: AAROM;Left;15 reps;Supine Straight Leg Raises: AAROM;Left;10 reps;Supine      Assessment/Plan    PT Assessment Patient needs continued PT services  PT Diagnosis Difficulty walking   PT Problem List Decreased strength;Decreased range of motion;Decreased activity tolerance;Decreased mobility;Decreased knowledge of use of DME;Obesity;Pain  PT Treatment Interventions DME instruction;Gait training;Stair training;Functional  mobility training;Therapeutic activities;Therapeutic exercise;Patient/family education   PT Goals (Current goals can be found in the Care Plan section) Acute Rehab PT Goals Patient Stated Goal: Resume previous lifestyle with decreased pain PT Goal Formulation: With patient Time For Goal Achievement: 05/07/15 Potential to Achieve Goals: Good    Frequency 7X/week   Barriers to discharge        Co-evaluation               End of Session Equipment Utilized During Treatment: Gait belt;Left knee immobilizer Activity Tolerance: Patient tolerated treatment well Patient left: in chair;with call bell/phone within reach Nurse Communication: Mobility status         Time: 7035-0093 PT Time Calculation (min) (ACUTE ONLY): 39 min   Charges:   PT Evaluation $Initial PT Evaluation Tier I: 1 Procedure PT Treatments $Gait Training: 8-22 mins $Therapeutic Exercise: 8-22 mins   PT G Codes:        Dan [name] 05/21/15, 12:48 PM

## 2015-05-01 NOTE — Plan of Care (Signed)
Problem: Consults Goal: Diagnosis- Total Joint Replacement Outcome: Completed/Met Date Met:  05/01/15 Primary Total Knee LEFT

## 2015-05-02 LAB — CBC
HCT: 40.7 % (ref 39.0–52.0)
Hemoglobin: 12.8 g/dL — ABNORMAL LOW (ref 13.0–17.0)
MCH: 27.6 pg (ref 26.0–34.0)
MCHC: 31.4 g/dL (ref 30.0–36.0)
MCV: 87.9 fL (ref 78.0–100.0)
PLATELETS: 162 10*3/uL (ref 150–400)
RBC: 4.63 MIL/uL (ref 4.22–5.81)
RDW: 14 % (ref 11.5–15.5)
WBC: 7.5 10*3/uL (ref 4.0–10.5)

## 2015-05-02 LAB — GLUCOSE, CAPILLARY
GLUCOSE-CAPILLARY: 138 mg/dL — AB (ref 65–99)
GLUCOSE-CAPILLARY: 151 mg/dL — AB (ref 65–99)
GLUCOSE-CAPILLARY: 139 mg/dL — AB (ref 65–99)
Glucose-Capillary: 166 mg/dL — ABNORMAL HIGH (ref 65–99)
Glucose-Capillary: 233 mg/dL — ABNORMAL HIGH (ref 65–99)

## 2015-05-02 NOTE — Progress Notes (Signed)
Physical Therapy Treatment Patient Details Name: Dan [name] MRN: [id number] DOB: 28-Nov-1950 Today's Date: 05/02/2015    History of Present Illness [name]    PT Comments    Pt progressing slowly with mobility.  Continues easily distracted and requiring significant cueing to focus on task.  Follow Up Recommendations  Home health PT     Equipment Recommendations  Rolling walker with 5" wheels    Recommendations for Other Services OT consult     Precautions / Restrictions Precautions Precautions: Knee;Fall Required Braces or Orthoses: Knee Immobilizer - Left Knee Immobilizer - Left: Discontinue once straight leg raise with < 10 degree lag Restrictions Weight Bearing Restrictions: No Other Position/Activity Restrictions: WBAT    Mobility  Bed Mobility Overal bed mobility: Needs Assistance Bed Mobility: Sit to Supine       Sit to supine: Min assist;Mod assist   General bed mobility comments: cues for sequencing and physical assist fro L LE management  Transfers Overall transfer level: Needs assistance Equipment used: Rolling walker (2 wheeled) Transfers: Sit to/from Stand Sit to Stand: Max assist         General transfer comment: cues for UE/LE placement  Ambulation/Gait Ambulation/Gait assistance: Min assist;Mod assist Ambulation Distance (Feet): 75 Feet Assistive device: Rolling walker (2 wheeled) Gait Pattern/deviations: Step-to pattern;Decreased step length - right;Decreased step length - left;Shuffle;Trunk flexed Gait velocity: decr   General Gait Details: cues for sequence, step to gait, posture and position from [employer]            Wheelchair Mobility    Modified Rankin (Stroke Patients Only)       Balance                                    Cognition Arousal/Alertness: Awake/alert Behavior During Therapy: WFL for tasks assessed/performed Overall Cognitive Status: Impaired/Different from baseline Area of  Impairment: Attention       Following Commands: Follows one step commands consistently       General Comments: easily distractible.  Needed mof cues for sequenceing steps and using arms during ambulation    Exercises Total Joint Exercises Ankle Circles/Pumps: Both;15 reps;Supine;AAROM;AROM Quad Sets: Both;Supine;20 reps Heel Slides: AAROM;Left;Supine;20 reps Straight Leg Raises: AAROM;Left;Supine;20 reps    General Comments        Pertinent Vitals/Pain Pain Assessment: 0-10 Pain Score: 4  Pain Location: L knee Pain Descriptors / Indicators: Aching;Sore Pain Intervention(s): Limited activity within patient's tolerance;Monitored during session;Premedicated before session;Ice applied    Home Living                      Prior Function            PT Goals (current goals can now be found in the care plan section) Acute Rehab PT Goals Patient Stated Goal: Resume previous lifestyle with decreased pain PT Goal Formulation: With patient Time For Goal Achievement: 05/07/15 Potential to Achieve Goals: Good Progress towards PT goals: Progressing toward goals    Frequency  7X/week    PT Plan Current plan remains appropriate    Co-evaluation             End of Session Equipment Utilized During Treatment: Gait belt;Left knee immobilizer Activity Tolerance: Patient tolerated treatment well Patient left: in bed;with call bell/phone within reach     Time: 1405-1445 PT Time Calculation (min) (ACUTE ONLY): 40 min  Charges:  $  Gait Training: 23-37 mins $Therapeutic Exercise: 8-22 mins                    G Codes:      Valita Righter 05/14/15, 2:48 PM

## 2015-05-02 NOTE — Progress Notes (Signed)
Occupational Therapy Treatment Patient Details Name: ORLEY LAWRY MRN: [id number] DOB: [birth year] Today's Date: 05/02/2015    History of present illness [name]   OT comments  Pt very sleep this session. Wife present for end of session and will measure bathroom area for 3 N 1 options  Follow Up Recommendations  No OT follow up;Supervision/Assistance - 24 hour    Equipment Recommendations  3 in 1 bedside comode    Recommendations for Other Services      Precautions / Restrictions Precautions Precautions: Knee;Fall Required Braces or Orthoses: Knee Immobilizer - Left Knee Immobilizer - Left: Discontinue once straight leg raise with < 10 degree lag Restrictions Weight Bearing Restrictions: No Other Position/Activity Restrictions: WBAT       Mobility Bed Mobility               General bed mobility comments: pt sitting in chair  Transfers Overall transfer level: Needs assistance Equipment used: 2 person hand held assist Transfers: Sit to/from Stand Sit to Stand: Max assist         General transfer comment: cues for UE/LE placement        ADL                       Lower Body Dressing: With adaptive equipment;Maximal assistance;Sit to/from stand                 General ADL Comments: Ae education. Pt is not interested in sock aid- but is interested in reacher.  Pt needs an elevated toilet seat. Pt toilet sits back in room in which wife will measure as pt will likely need a wide BSC.                  Cognition   Behavior During Therapy: WFL for tasks assessed/performed Overall Cognitive Status: Impaired/Different from baseline (pt very sleepy- pt feels it is the meds) Area of Impairment: Attention        Following Commands: Follows one step commands inconsistently       General Comments: pt very distractible with any noise around.  Needed max cues for sequenceing steps and using arms during ambulation    Extremity/Trunk  Assessment               Exercises Total Joint Exercises Ankle Circles/Pumps: Both;15 reps;Supine;AAROM;AROM Quad Sets: Both;Supine;20 reps Heel Slides: AAROM;Left;Supine;20 reps Straight Leg Raises: AAROM;Left;Supine;20 reps Goniometric ROM: AAROM at L knee -10 - 70   Shoulder Instructions       General Comments      Pertinent Vitals/ Pain       Pain Assessment: 0-10 Pain Score: 3  Pain Location: Lknee Pain Descriptors / Indicators: Sore Pain Intervention(s): Monitored during session     Prior Functioning/Environment              Frequency Min 2X/week     Progress Toward Goals  OT Goals(current goals can now be found in the care plan section)  Progress towards OT goals: Progressing toward goals  Acute Rehab OT Goals Patient Stated Goal: Resume previous lifestyle with decreased pain  Plan Discharge plan remains appropriate    Co-evaluation                 End of Session     Activity Tolerance Patient tolerated treatment well   Patient Left in chair;with call bell/phone within reach;with family/visitor present   Nurse Communication  Time: 1030-1314 OT Time Calculation (min): 13 min  Charges: OT General Charges $OT Visit: 1 Procedure OT Treatments $Self Care/Home Management : 8-22 mins  Keevon Henney, Edwena Felty D 05/02/2015, 11:10 AM

## 2015-05-02 NOTE — Progress Notes (Signed)
Dan [name]  MRN: [id number] DOB/Age: Dec 09, 1950 64 y.o. Physician: Dan [name], M.D. 2 Days Post-Op Procedure(s) (LRB): LEFT TOTAL KNEE ARTHROPLASTY (Left)  Subjective: Resting comfortably, reports calf pain completely resolved, no SOB or CP Vital Signs Temp:  [99.3 F (37.4 C)-100.1 F (37.8 C)] 99.7 F (37.6 C) (07/30 0625) Pulse Rate:  [82-87] 87 (07/30 0625) Resp:  [14-18] 14 (07/30 0625) BP: (140-149)/(73-83) 143/83 mmHg (07/30 0625) SpO2:  [87 %-98 %] 94 % (07/30 0640)  Lab Results  Recent Labs  05/01/15 0420 05/02/15 0540  WBC 6.3 7.5  HGB 13.0 12.8*  HCT 41.5 40.7  PLT 187 162   BMET  Recent Labs  05/01/15 0420  NA 135  K 3.8  CL 98*  CO2 30  GLUCOSE 153*  BUN 14  CREATININE 0.95  CALCIUM 9.1   INR  Date Value Ref Range Status  04/24/2015 1.24 0.00 - 1.49 Final     Exam  Ace bandage to LLE noted to have shifted causing several areas of swelling. Once removed compartments noted to be soft, no cords, negative homans, dressing with scant dried blood  Plan Continue post TKA protocol, mobilize, likely D/C Sunday.  Dan [name] M 05/02/2015, 8:36 AM    Contact # [phone]

## 2015-05-02 NOTE — Progress Notes (Signed)
Patient reported severe  left calf pain unrelieved with pain med. Homan's sign positive. Will notify doctor this am.

## 2015-05-02 NOTE — Progress Notes (Signed)
Physical Therapy Treatment Patient Details Name: Dan [name] MRN: [id number] DOB: Jun 13, 1951 Today's Date: 05/02/2015    History of Present Illness [name]    PT Comments    Pt progressing with mobility but continues slow to process cues and follow through.  Follow Up Recommendations  Home health PT     Equipment Recommendations  Rolling walker with 5" wheels    Recommendations for Other Services OT consult     Precautions / Restrictions Precautions Precautions: Knee;Fall Required Braces or Orthoses: Knee Immobilizer - Left Knee Immobilizer - Left: Discontinue once straight leg raise with < 10 degree lag Restrictions Weight Bearing Restrictions: No Other Position/Activity Restrictions: WBAT    Mobility  Bed Mobility               General bed mobility comments: NT - pt OOB and declines back to bed  Transfers Overall transfer level: Needs assistance Equipment used: Rolling walker (2 wheeled) Transfers: Sit to/from Stand Sit to Stand: Mod assist         General transfer comment: cues for UE/LE placement  Ambulation/Gait Ambulation/Gait assistance: Min assist;Mod assist Ambulation Distance (Feet): 88 Feet Assistive device: Rolling walker (2 wheeled) Gait Pattern/deviations: Step-to pattern;Decreased step length - right;Decreased step length - left;Shuffle;Trunk flexed Gait velocity: decr   General Gait Details: cues for sequence, step to gait, posture and position from [employer]            Wheelchair Mobility    Modified Rankin (Stroke Patients Only)       Balance                                    Cognition Arousal/Alertness: Awake/alert Behavior During Therapy: WFL for tasks assessed/performed Overall Cognitive Status: Impaired/Different from baseline Area of Impairment: Attention       Following Commands: Follows one step commands inconsistently       General Comments: pt very distractible with any noise  around.  Needed max cues for sequenceing steps and using arms during ambulation    Exercises Total Joint Exercises Ankle Circles/Pumps: Both;15 reps;Supine;AAROM;AROM Quad Sets: Both;Supine;20 reps Heel Slides: AAROM;Left;Supine;20 reps Straight Leg Raises: AAROM;Left;Supine;20 reps Goniometric ROM: AAROM at L knee -10 - 70    General Comments        Pertinent Vitals/Pain Pain Assessment: 0-10 Pain Score: 4  Pain Location: L knee Pain Descriptors / Indicators: Aching;Sore Pain Intervention(s): Limited activity within patient's tolerance;Premedicated before session;Monitored during session;Ice applied    Home Living                      Prior Function            PT Goals (current goals can now be found in the care plan section) Acute Rehab PT Goals Patient Stated Goal: Resume previous lifestyle with decreased pain PT Goal Formulation: With patient Time For Goal Achievement: 05/07/15 Potential to Achieve Goals: Good Progress towards PT goals: Progressing toward goals    Frequency  7X/week    PT Plan Current plan remains appropriate    Co-evaluation             End of Session Equipment Utilized During Treatment: Gait belt;Left knee immobilizer Activity Tolerance: Patient tolerated treatment well Patient left: in chair;with call bell/phone within reach     Time: 0857-0943 PT Time Calculation (min) (ACUTE ONLY): 46 min  Charges:  $Gait  Training: 8-22 mins $Therapeutic Exercise: 23-37 mins                    G Codes:      Sahory Nordling 2015/05/20, 10:54 AM

## 2015-05-03 LAB — CBC
HCT: 39.6 % (ref 39.0–52.0)
Hemoglobin: 12.8 g/dL — ABNORMAL LOW (ref 13.0–17.0)
MCH: 28.4 pg (ref 26.0–34.0)
MCHC: 32.3 g/dL (ref 30.0–36.0)
MCV: 88 fL (ref 78.0–100.0)
PLATELETS: 172 10*3/uL (ref 150–400)
RBC: 4.5 MIL/uL (ref 4.22–5.81)
RDW: 13.9 % (ref 11.5–15.5)
WBC: 7.2 10*3/uL (ref 4.0–10.5)

## 2015-05-03 LAB — GLUCOSE, CAPILLARY: Glucose-Capillary: 177 mg/dL — ABNORMAL HIGH (ref 65–99)

## 2015-05-03 NOTE — Progress Notes (Signed)
Physical Therapy Treatment Patient Details Name: Dan [name] MRN: [id number] DOB: [birth year] Today's Date: 05/03/2015    History of Present Illness [name]    PT Comments    POD # 3 applied KI as pt was unable to perform active SLR.  Instructed on proper application and use for amb and esp for stairs.  Assisted OOB required increased time.  Assisted with amb in hallway then practicing going up and down 2 steps B rails.  Returned to room and given handout TKR HEP.  Instructed on proper tech and freq.  Pt ready for D/C to home.  Follow Up Recommendations  Home health PT     Equipment Recommendations  Rolling walker with 5" wheels    Recommendations for Other Services       Precautions / Restrictions Precautions Precautions: Knee;Fall Precaution Comments: instructed on KI use form amb and esp stairs.  Repeated directions throughout session as pt appeared "cloudy" in responce.  Required Braces or Orthoses: Knee Immobilizer - Left Knee Immobilizer - Left: Discontinue once straight leg raise with < 10 degree lag Restrictions Weight Bearing Restrictions: No Other Position/Activity Restrictions: WBAT    Mobility  Bed Mobility Overal bed mobility: Needs Assistance Bed Mobility: Supine to Sit     Supine to sit: Min assist     General bed mobility comments: cues for sequencing and physical assist fro L LE management  Transfers Overall transfer level: Needs assistance Equipment used: Rolling walker (2 wheeled) Transfers: Sit to/from Stand Sit to Stand: Min guard;Min assist         General transfer comment: cues for UE/LE placement plus increased time  Ambulation/Gait Ambulation/Gait assistance: Min guard;Supervision Ambulation Distance (Feet): 45 Feet Assistive device: Rolling walker (2 wheeled) Gait Pattern/deviations: Step-to pattern;Decreased stance time - left;Trunk flexed     General Gait Details: cues for sequence, step to gait, posture and position from  [employer] Stairs: Yes Stairs assistance: Min guard Stair Management: Two rails;Step to pattern;Forwards Number of Stairs: 2 General stair comments: 50% VC's on proper sequencing and safety.  Repeat cueing needed.   Wheelchair Mobility    Modified Rankin (Stroke Patients Only)       Balance                                    Cognition                            Exercises      General Comments        Pertinent Vitals/Pain Pain Assessment: 0-10 Pain Score: 3  Pain Location: L knee Pain Descriptors / Indicators: Sore Pain Intervention(s): Monitored during session;Repositioned;Ice applied    Home Living                      Prior Function            PT Goals (current goals can now be found in the care plan section) Progress towards PT goals: Progressing toward goals    Frequency  7X/week    PT Plan      Co-evaluation             End of Session Equipment Utilized During Treatment: Gait belt;Left knee immobilizer Activity Tolerance: Patient tolerated treatment well Patient left: in chair;with call bell/phone within reach     Time: [phone]  PT Time Calculation (min) (ACUTE ONLY): 27 min  Charges:  $Gait Training: 8-22 mins $Therapeutic Activity: 8-22 mins                    G Codes:      Rica Koyanagi  PTA [facility]  Acute  Rehab Pager      [phone]

## 2015-05-03 NOTE — Progress Notes (Signed)
Discharge instructions discussed with patient with ample time for questions. Pt able to answer questions about pain meds and when to use immobilizer and when to call md. Am assessment unchanged except iv discontinued

## 2015-05-03 NOTE — Progress Notes (Signed)
    Subjective: 3 Days Post-Op Procedure(s) (LRB): LEFT TOTAL KNEE ARTHROPLASTY (Left) Patient reports pain as 3 on 0-10 scale.   Denies CP or SOB.  Voiding without difficulty. Positive flatus. Objective: Vital signs in last 24 hours: Temp:  [98.3 F (36.8 C)-99.2 F (37.3 C)] 99.1 F (37.3 C) (07/31 0526) Pulse Rate:  [81-91] 91 (07/31 0526) Resp:  [16-18] 18 (07/31 0526) BP: (93-150)/(72-88) 93/78 mmHg (07/31 0526) SpO2:  [93 %-96 %] 95 % (07/31 0526)  Intake/Output from previous day: 07/30 0701 - 07/31 0700 In: 1320 [P.O.:1320] Out: 2175 [Urine:2175] Intake/Output this shift:    Labs:  Recent Labs  05/01/15 0420 05/02/15 0540 05/03/15 0450  HGB 13.0 12.8* 12.8*    Recent Labs  05/02/15 0540 05/03/15 0450  WBC 7.5 7.2  RBC 4.63 4.50  HCT 40.7 39.6  PLT 162 172    Recent Labs  05/01/15 0420  NA 135  K 3.8  CL 98*  CO2 30  BUN 14  CREATININE 0.95  GLUCOSE 153*  CALCIUM 9.1   No results for input(s): LABPT, INR in the last 72 hours.  Physical Exam: Neurologically intact ABD soft Intact pulses distally Incision: dressing C/D/I Compartment soft  Assessment/Plan: 3 Days Post-Op Procedure(s) (LRB): LEFT TOTAL KNEE ARTHROPLASTY (Left) Advance diet Up with therapy  Plan on d/c to home today  Dacia Capers D for Dr. Melina Schools [facility] [phone] 05/03/2015, 8:24 AM

## 2015-05-03 NOTE — Care Management (Signed)
CM spoke with patient via telephone. Patient states he does want a 3N1. James at [facility] notified of DME request. A wide 3N1 will be delivered to patient's home. Patient states he has the telephone number for [facility] DME. Presenter, broadcasting BSN CCM

## 2015-05-03 NOTE — Care Management (Signed)
Patient declines the need for a 3N1. Presenter, broadcasting BSN CCM

## 2015-05-03 NOTE — Progress Notes (Signed)
Occupational Therapy Treatment Patient Details Name: Dan [name] MRN: [id number] DOB: 05/28/1951 Today's Date: 05/03/2015    History of present illness [name]   OT comments  Spoke to CM who reports pt says he does not need wide 3n1, in to room to check with pt since this is what OT said from yesterday to me that needed to be addressed. Pt reports he thinks they are borrowing one from a neighbor, but he has called his wife to make sure--waiting on call back. Pt will make NT, Jamie aware if he indeed does need one.  Follow Up Recommendations       Equipment Recommendations  3 in 1 bedside comode (wide)       Precautions / Restrictions Restrictions Weight Bearing Restrictions: No                                                                                    Almon Register 05/03/2015, 9:29 AM

## 2015-05-04 NOTE — Discharge Summary (Signed)
Physician Discharge Summary   Patient ID: VALDEZ BRANNAN MRN: [id number] DOB/AGE: [birth year] 66 y.o.  Admit date: 04/30/2015 Discharge date: 05/03/2015  Primary Diagnosis: left knee primary osteoarthritis  Admission Diagnoses:  Past Medical History  Diagnosis Date  . Chronic atrial fibrillation   . Hypertension   . Hyperlipidemia   . Morbid obesity   . BPH (benign prostatic hypertrophy)   . Diabetes mellitus   . Sleep apnea     CPAP nightly  . Colon polyps     adenomatous  . Diverticulosis   . Hemorrhoids   . Dysrhythmia     tx. Pradaxa- Dr. Martinique follows  . GERD (gastroesophageal reflux disease)   . Arthritis     osteoarthritis left knee   Discharge Diagnoses:   Principal Problem:   Primary osteoarthritis of left knee Active Problems:   Left knee DJD  Estimated body mass index is 37.74 kg/(m^2) as calculated from the following:   Height as of this encounter: _0  (1.854 m).   Weight as of this encounter: 129.729 kg (286 lb).  Procedure:  Procedure(s) (LRB): LEFT TOTAL KNEE ARTHROPLASTY (Left)   Consults: None  HPI: see H&P Laboratory Data: Admission on 04/30/2015, Discharged on 05/03/2015  Component Date Value Ref Range Status  . Glucose-Capillary 04/30/2015 149* 65 - 99 mg/dL Final  . Comment 1 04/30/2015 Notify RN   Final  . Glucose-Capillary 04/30/2015 174* 65 - 99 mg/dL Final  . Comment 1 04/30/2015 Notify RN   Final  . Comment 2 04/30/2015 Document in Chart   Final  . WBC 05/01/2015 6.3  4.0 - 10.5 K/uL Final  . RBC 05/01/2015 4.65  4.22 - 5.81 MIL/uL Final  . Hemoglobin 05/01/2015 13.0  13.0 - 17.0 g/dL Final  . HCT 05/01/2015 41.5  39.0 - 52.0 % Final  . MCV 05/01/2015 89.2  78.0 - 100.0 fL Final  . MCH 05/01/2015 28.0  26.0 - 34.0 pg Final  . MCHC 05/01/2015 31.3  30.0 - 36.0 g/dL Final  . RDW 05/01/2015 14.5  11.5 - 15.5 % Final  . Platelets 05/01/2015 187  150 - 400 K/uL Final  . Sodium 05/01/2015 135  135 - 145 mmol/L Final  . Potassium  05/01/2015 3.8  3.5 - 5.1 mmol/L Final  . Chloride 05/01/2015 98* 101 - 111 mmol/L Final  . CO2 05/01/2015 30  22 - 32 mmol/L Final  . Glucose, Bld 05/01/2015 153* 65 - 99 mg/dL Final  . BUN 05/01/2015 14  6 - 20 mg/dL Final  . Creatinine, Ser 05/01/2015 0.95  0.61 - 1.24 mg/dL Final  . Calcium 05/01/2015 9.1  8.9 - 10.3 mg/dL Final  . GFR calc non Af Amer 05/01/2015 >60  >60 mL/min Final  . GFR calc Af Amer 05/01/2015 >60  >60 mL/min Final   Comment: (NOTE) The eGFR has been calculated using the CKD EPI equation. This calculation has not been validated in all clinical situations. eGFR's persistently <60 mL/min signify possible Chronic Kidney Disease.   . Anion gap 05/01/2015 7  5 - 15 Final  . Glucose-Capillary 04/30/2015 158* 65 - 99 mg/dL Final  . Comment 1 04/30/2015 Notify RN   Final  . Comment 2 04/30/2015 Document in Chart   Final  . Glucose-Capillary 05/01/2015 153* 65 - 99 mg/dL Final  . Glucose-Capillary 05/01/2015 126* 65 - 99 mg/dL Final  . WBC 05/02/2015 7.5  4.0 - 10.5 K/uL Final  . RBC 05/02/2015 4.63  4.22 - 5.81 MIL/uL Final  .  Hemoglobin 05/02/2015 12.8* 13.0 - 17.0 g/dL Final  . HCT 05/02/2015 40.7  39.0 - 52.0 % Final  . MCV 05/02/2015 87.9  78.0 - 100.0 fL Final  . MCH 05/02/2015 27.6  26.0 - 34.0 pg Final  . MCHC 05/02/2015 31.4  30.0 - 36.0 g/dL Final  . RDW 05/02/2015 14.0  11.5 - 15.5 % Final  . Platelets 05/02/2015 162  150 - 400 K/uL Final  . Glucose-Capillary 05/01/2015 192* 65 - 99 mg/dL Final  . Glucose-Capillary 05/01/2015 166* 65 - 99 mg/dL Final  . Comment 1 05/01/2015 Procedure Error   Final  . Glucose-Capillary 05/02/2015 138* 65 - 99 mg/dL Final  . Glucose-Capillary 05/02/2015 151* 65 - 99 mg/dL Final  . WBC 05/03/2015 7.2  4.0 - 10.5 K/uL Final  . RBC 05/03/2015 4.50  4.22 - 5.81 MIL/uL Final  . Hemoglobin 05/03/2015 12.8* 13.0 - 17.0 g/dL Final  . HCT 05/03/2015 39.6  39.0 - 52.0 % Final  . MCV 05/03/2015 88.0  78.0 - 100.0 fL Final  . MCH  05/03/2015 28.4  26.0 - 34.0 pg Final  . MCHC 05/03/2015 32.3  30.0 - 36.0 g/dL Final  . RDW 05/03/2015 13.9  11.5 - 15.5 % Final  . Platelets 05/03/2015 172  150 - 400 K/uL Final  . Glucose-Capillary 05/02/2015 139* 65 - 99 mg/dL Final  . Glucose-Capillary 05/02/2015 233* 65 - 99 mg/dL Final  . Glucose-Capillary 05/03/2015 177* 65 - 99 mg/dL Final  . Comment 1 05/03/2015 Notify RN   Final  . Comment 2 05/03/2015 Document in Chart   Final  Hospital Outpatient Visit on 04/24/2015  Component Date Value Ref Range Status  . MRSA, PCR 04/24/2015 NEGATIVE  NEGATIVE Final  . Staphylococcus aureus 04/24/2015 NEGATIVE  NEGATIVE Final   Comment:        The Xpert SA Assay (FDA approved for NASAL specimens in patients over 27 years of age), is one component of a comprehensive surveillance program.  Test performance has been validated by [facility] for patients greater than or equal to 21 year old. It is not intended to diagnose infection nor to guide or monitor treatment.   . Sodium 04/24/2015 139  135 - 145 mmol/L Final  . Potassium 04/24/2015 3.9  3.5 - 5.1 mmol/L Final  . Chloride 04/24/2015 100* 101 - 111 mmol/L Final  . CO2 04/24/2015 31  22 - 32 mmol/L Final  . Glucose, Bld 04/24/2015 159* 65 - 99 mg/dL Final  . BUN 04/24/2015 17  6 - 20 mg/dL Final  . Creatinine, Ser 04/24/2015 0.72  0.61 - 1.24 mg/dL Final  . Calcium 04/24/2015 10.4* 8.9 - 10.3 mg/dL Final  . GFR calc non Af Amer 04/24/2015 >60  >60 mL/min Final  . GFR calc Af Amer 04/24/2015 >60  >60 mL/min Final   Comment: (NOTE) The eGFR has been calculated using the CKD EPI equation. This calculation has not been validated in all clinical situations. eGFR's persistently <60 mL/min signify possible Chronic Kidney Disease.   . Anion gap 04/24/2015 8  5 - 15 Final  . WBC 04/24/2015 6.0  4.0 - 10.5 K/uL Final  . RBC 04/24/2015 5.14  4.22 - 5.81 MIL/uL Final  . Hemoglobin 04/24/2015 14.4  13.0 - 17.0 g/dL Final  . HCT  04/24/2015 44.2  39.0 - 52.0 % Final  . MCV 04/24/2015 86.0  78.0 - 100.0 fL Final  . MCH 04/24/2015 28.0  26.0 - 34.0 pg Final  . MCHC 04/24/2015  32.6  30.0 - 36.0 g/dL Final  . RDW 04/24/2015 14.2  11.5 - 15.5 % Final  . Platelets 04/24/2015 200  150 - 400 K/uL Final  . Prothrombin Time 04/24/2015 15.7* 11.6 - 15.2 seconds Final  . INR 04/24/2015 1.24  0.00 - 1.49 Final  . ABO/RH(D) 04/24/2015 O POS   Final  . Antibody Screen 04/24/2015 NEG   Final  . Sample Expiration 04/24/2015 05/03/2015   Final  . Color, Urine 04/24/2015 YELLOW  YELLOW Final  . APPearance 04/24/2015 CLEAR  CLEAR Final  . Specific Gravity, Urine 04/24/2015 1.021  1.005 - 1.030 Final  . pH 04/24/2015 5.5  5.0 - 8.0 Final  . Glucose, UA 04/24/2015 NEGATIVE  NEGATIVE mg/dL Final  . Hgb urine dipstick 04/24/2015 NEGATIVE  NEGATIVE Final  . Bilirubin Urine 04/24/2015 NEGATIVE  NEGATIVE Final  . Ketones, ur 04/24/2015 NEGATIVE  NEGATIVE mg/dL Final  . Protein, ur 04/24/2015 100* NEGATIVE mg/dL Final  . Urobilinogen, UA 04/24/2015 1.0  0.0 - 1.0 mg/dL Final  . Nitrite 04/24/2015 NEGATIVE  NEGATIVE Final  . Leukocytes, UA 04/24/2015 NEGATIVE  NEGATIVE Final  . ABO/RH(D) 04/24/2015 O POS   Final  . Squamous Epithelial / LPF 04/24/2015 RARE  RARE Final  . Edwina Barth 04/24/2015 MUCOUS PRESENT   Final     X-Rays:Dg Knee 1-2 Views Left  04/24/2015   CLINICAL DATA:  Osteoarthritis.  EXAM: LEFT KNEE - 1-2 VIEW  COMPARISON:  None.  FINDINGS: Tricompartment degenerative change. Degenerative changes most prominent about the medial compartment. No acute abnormality. Peripheral vascular calcification.  IMPRESSION: 1. Tricompartment degenerative change.  No acute bony abnormality. 2. Peripheral vascular disease.   Electronically Signed   By: Marcello Moores  Register   On: 04/24/2015 10:14   Dg Knee Left Port  04/30/2015   CLINICAL DATA:  Total left knee replacement.  EXAM: PORTABLE LEFT KNEE - 1-2 VIEW  COMPARISON:  04/24/2015.  FINDINGS:  Interval left total knee prosthesis in satisfactory position and alignment. No fracture or dislocation seen.  IMPRESSION: Satisfactory postoperative appearance of a left total knee replacement.   Electronically Signed   By: Claudie Revering M.D.   On: 04/30/2015 12:32    EKG: Orders placed or performed during the hospital encounter of 11/02/14  . EKG 12-Lead  . EKG 12-Lead  . EKG 12-Lead  . EKG 12-Lead  . EKG     Hospital Course: Dan [name] is a 64 y.o. who was admitted to [facility]. They were brought to the operating room on 04/30/2015 and underwent Procedure(s): LEFT TOTAL KNEE ARTHROPLASTY.  Patient tolerated the procedure well and was later transferred to the recovery room and then to the orthopaedic floor for postoperative care.  They were given PO and IV analgesics for pain control following their surgery.  They were given 24 hours of postoperative antibiotics of  Anti-infectives    Start     Dose/Rate Route Frequency Ordered Stop   04/30/15 1600  ceFAZolin (ANCEF) IVPB 2 g/50 mL premix     2 g 100 mL/hr over 30 Minutes Intravenous Every 6 hours 04/30/15 1435 05/01/15 0547   04/30/15 1415  ceFAZolin (ANCEF) 3 g in dextrose 5 % 50 mL IVPB  Status:  Discontinued     3 g 160 mL/hr over 30 Minutes Intravenous 4 times per day 04/30/15 1404 04/30/15 1435   04/30/15 1009  polymyxin B 500,000 Units, bacitracin 50,000 Units in sodium chloride irrigation 0.9 % 500 mL irrigation  Status:  Discontinued  As needed 04/30/15 1009 04/30/15 1145   04/30/15 0600  ceFAZolin (ANCEF) 3 g in dextrose 5 % 50 mL IVPB     3 g 160 mL/hr over 30 Minutes Intravenous On call to O.R. 04/29/15 1321 04/30/15 0945     and started on DVT prophylaxis in the form of Pradaxa, TED hose and SCDs.   PT and OT were ordered for total joint protocol.  Discharge planning consulted to help with postop disposition and equipment needs.  Patient had a good night on the evening of surgery.  They started to get  up OOB with therapy on day one. Continued to work with therapy into day two.  By day three, the patient had progressed with therapy and meeting their goals.  Incision was healing well.  Patient was seen in rounds and was ready to go home.   Diet: Diabetic diet Activity:WBAT Follow-up:in 10-14 days Disposition - Home with HHPT Discharged Condition: fair      Medication List    TAKE these medications        acetaminophen 500 MG tablet  Commonly known as:  TYLENOL  Take 500-1,000 mg by mouth every 6 (six) hours as needed for mild pain.     albuterol 108 (90 BASE) MCG/ACT inhaler  Commonly known as:  PROVENTIL HFA;VENTOLIN HFA  Inhale 2 puffs into the lungs every 6 (six) hours as needed for wheezing or shortness of breath.     amLODipine 10 MG tablet  Commonly known as:  NORVASC  Take 10 mg by mouth every morning.     BYSTOLIC 10 MG tablet  Generic drug:  nebivolol  Take 40 mg by mouth every morning.     dabigatran 150 MG Caps capsule  Commonly known as:  PRADAXA  Take 1 capsule (150 mg total) by mouth every 12 (twelve) hours.     docusate sodium 100 MG capsule  Commonly known as:  COLACE  Take 1 capsule (100 mg total) by mouth 2 (two) times daily as needed for mild constipation.     ezetimibe-simvastatin 10-20 MG per tablet  Commonly known as:  VYTORIN  Take 1 tablet by mouth every morning.     fluticasone 50 MCG/ACT nasal spray  Commonly known as:  FLONASE  Place 2 sprays into both nostrils daily as needed.     furosemide 40 MG tablet  Commonly known as:  LASIX  Take 1 tablet (40 mg total) by mouth daily.     linagliptin 5 MG Tabs tablet  Commonly known as:  TRADJENTA  Take 5 mg by mouth every morning.     lisinopril 40 MG tablet  Commonly known as:  PRINIVIL,ZESTRIL  Take 40 mg by mouth every morning.     metFORMIN 1000 MG tablet  Commonly known as:  GLUCOPHAGE  Take 1 tablet by mouth 2 (two) times daily with a meal.     methocarbamol 500 MG tablet    Commonly known as:  ROBAXIN  Take 1 tablet (500 mg total) by mouth 3 (three) times daily between meals as needed for muscle spasms.     omeprazole 20 MG capsule  Commonly known as:  PRILOSEC  Take 1 capsule (20 mg total) by mouth daily.     oxyCODONE-acetaminophen 10-325 MG per tablet  Commonly known as:  PERCOCET  Take 1 tablet by mouth every 4 (four) hours as needed for pain.     sitaGLIPtin 50 MG tablet  Commonly known as:  JANUVIA  Take 50 mg  by mouth daily.           Follow-up Information    Follow up with BEANE,JEFFREY C, MD In 2 weeks.   Specialty:  Orthopedic Surgery   Contact information:   [address] [place] [address] [phone]       Follow up with [facility].   Why:  HHPT   Contact information:   [place] [address] [phone]       Follow up with [place].   Why:  RW, 3:1   Contact information:   [place] [address] [phone]       Signed: Lacie Draft, PA-C Orthopaedic Surgery 05/04/2015, 8:05 AM

## 2015-05-19 ENCOUNTER — Ambulatory Visit: Payer: 59 | Attending: Specialist | Admitting: Physical Therapy

## 2015-05-19 DIAGNOSIS — R29898 Other symptoms and signs involving the musculoskeletal system: Secondary | ICD-10-CM | POA: Insufficient documentation

## 2015-05-19 DIAGNOSIS — M25662 Stiffness of left knee, not elsewhere classified: Secondary | ICD-10-CM

## 2015-05-19 DIAGNOSIS — R262 Difficulty in walking, not elsewhere classified: Secondary | ICD-10-CM | POA: Insufficient documentation

## 2015-05-19 DIAGNOSIS — M25562 Pain in left knee: Secondary | ICD-10-CM | POA: Insufficient documentation

## 2015-05-19 NOTE — Therapy (Signed)
[facility] [address]  [place], [place], [address] Phone: [phone]   Fax:  [phone]  Physical Therapy Evaluation  Patient Details  Name: Dan [name] MRN: [id number] Date of Birth: 24-Feb-1951 Referring Provider:  Susa Day, MD  Encounter Date: 05/19/2015      PT End of Session - 05/19/15 1654    Visit Number 1   Number of Visits 14   Date for PT Re-Evaluation 06/30/15   PT Start Time 6962   PT Stop Time 9528   PT Time Calculation (min) 57 min   Activity Tolerance Patient tolerated treatment well   Behavior During Therapy [facility] for tasks assessed/performed      Past Medical History  Diagnosis Date  . Chronic atrial fibrillation   . Hypertension   . Hyperlipidemia   . Morbid obesity   . BPH (benign prostatic hypertrophy)   . Diabetes mellitus   . Sleep apnea     CPAP nightly  . Colon polyps     adenomatous  . Diverticulosis   . Hemorrhoids   . Dysrhythmia     tx. Pradaxa- Dr. Martinique follows  . GERD (gastroesophageal reflux disease)   . Arthritis     osteoarthritis left knee    Past Surgical History  Procedure Laterality Date  . US echocardiography  08/08/2006    ef 55-60%  . Knee arthroscopy Left 03/28/2014    Procedure: LEFT KNEE ARTHROSCOPY WITH DEBRIDEMENT  ;  Surgeon: Johnn Hai, MD;  Location: [facility];  Service: Orthopedics;  Laterality: Left;  . Tonsillectomy    . 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  Visit Diagnosis:  Left knee pain  Stiffness of knee joint, left  Difficulty walking      Subjective Assessment - 05/19/15 1541    Subjective Patient reports 2 yr h/o problems with left knee. Underwent knee scope last year without signifiacnt relief of pain,  therefore decided to go forward with the knee replacement. SInce surgery patient report pain well controlled with Percocet, but states knee starts throbbing if goes >2-3 hrs after meds are due. Patient weaned from walker to cane within ~3 days of being home from hospital.   Pertinent History Left TKR 04/30/15   Limitations Walking   How long can you walk comfortably? 2 blocks (~300 yrds)   Currently in Pain? Yes   Pain Score 5   Least 3/10, Avg 4-5/10, Worst 6/10   Pain Location Knee   Pain Orientation Left;Anterior;Lateral;Proximal   Pain Descriptors / Indicators Throbbing   Pain Type Surgical pain   Pain Onset 1 to 4 weeks ago   Pain Frequency Intermittent   Aggravating Factors  Prolonged propping knee in extension, stairs   Pain Relieving Factors Percocet, ice, walking   Effect of Pain on Daily Activities Stair climbing            [facility] PT Assessment - 05/19/15 1551    Assessment   Medical Diagnosis Left TKR   Onset Date/Surgical Date 04/30/15   Next MD Visit 06/12/15   Prior Therapy HH PT x 6 episodes   Balance Screen   Has the patient fallen in the past 6 months No   Has the patient had a decrease in activity level because  of a fear of falling?  No   Is the patient reluctant to leave their home because of a fear of falling?  No   Home Environment   Living Environment Private residence   Type of [place] to enter   Entrance Stairs-Number of Steps 1+1   Home Layout One level   [place] - 2 wheels;Cane - single point;Bedside commode   Prior Function   Level of Independence Independent   Vocation Full time employment   Engineer, manufacturing job  [employer]   Leisure Golf, walk   Observation/Other Assessments   Focus on Therapeutic Outcomes (FOTO)  46% (54% limitation); predicted 62% (38% limited)   ROM / Strength   AROM / PROM / Strength AROM;PROM;Strength   AROM   AROM Assessment Site Knee   Right/Left Knee Left   Left Knee  Extension 21   Left Knee Flexion 106   PROM   PROM Assessment Site Knee   Right/Left Knee Left   Left Knee Extension 12   Left Knee Flexion 112   Strength   Strength Assessment Site Hip;Knee   Right/Left Hip Left;Right   Right Hip Flexion 4+/5   Right Hip Extension 4/5   Right Hip ABduction 4+/5   Left Hip Flexion 4-/5   Left Hip Extension 3+/5   Left Hip ABduction 3+/5   Left Hip ADduction 3+/5   Right/Left Knee Left;Right   Right Knee Flexion 5/5   Right Knee Extension 5/5   Left Knee Flexion 4-/5   Left Knee Extension 3+/5   Palpation   Palpation comment Increased circumferential edema left knee with slight redness noted but temp same as opposite leg   Ambulation/Gait   Ambulation/Gait Assistance 6: Modified independent (Device/Increase time)   Assistive device Straight cane   Gait Pattern Step-through pattern;Left flexed knee in stance;Wide base of support                   [facility] Adult PT Treatment/Exercise - 05/19/15 1551    Exercises   Exercises Knee/Hip   Knee/Hip Exercises: Stretches   Passive Hamstring Stretch Left;20 seconds;3 reps   Passive Hamstring Stretch Limitations Supine with strap   Gastroc Stretch Left;20 seconds;3 reps   Gastroc Stretch Limitations Seated combined hamstring/gastroc stretch   Knee/Hip Exercises: Standing   Hip Flexion Both;10 reps;Knee bent   Hip Flexion Limitations Marching with HHA on counter   Hip Abduction Left;10 reps;Knee straight   Abduction Limitations HHA on counter   Hip Extension Left;10 reps;Knee straight   Extension Limitations HHA on counter   Knee/Hip Exercises: Supine   Short Arc Quad Sets Left;10 reps                PT Education - 05/19/15 1648    Education provided Yes   Education Details Initial HEP (refer to Pt Instructions); Continued use of ice and pain meds for pain control   Person(s) Educated Patient   Methods Explanation;Demonstration;Handout   Comprehension Verbalized  understanding;Returned demonstration;Need further instruction             PT Long Term Goals - 05/19/15 1700    PT LONG TERM GOAL #1   Title Independent with HEP (06/30/15)   Time 6   Period Weeks   Status New   PT LONG TERM GOAL #2   Title Left knee ROM 3-120 or greater to allow normal motion with gait and stair climbing (06/30/15)   Time 6  Period Weeks   Status New   PT LONG TERM GOAL #3   Title Left knee strength 4/5 or greater for improved stability (06/30/15)   Time 6   Period Weeks   Status New   PT LONG TERM GOAL #4   Title Ambulate on all surfaces without cane with normal gait pattern (06/30/15)   Time 6   Period Weeks   Status New   PT LONG TERM GOAL #5   Title Patient will ascend/descend stairs with or without demonstrating reciprocal pattern (06/30/15)   Time 6   Period Weeks   Status New               Plan - 05/19/15 1712    Clinical Impression Statement Patient is a 64 y/o male who presents to OP PT 2.5 wks s/p Left TKR on 04/30/15. Patient presents to therapy using a SPC having weaned self off RW within a days of discharge from hospital. AROM 21-106 and PROM 12-112 with significant quad lag noted. Strength 3+ to 4-/5 in left hip and knee. Erythema noted along distal incision extending to proximal lower leg with edema present but no increased temperature relative to surrounding tissue or opposite leg.   Pt will benefit from skilled therapeutic intervention in order to improve on the following deficits Pain;Decreased range of motion;Impaired flexibility;Decreased strength;Decreased activity tolerance;Abnormal gait;Decreased balance   Rehab Potential Good   PT Frequency 3x / week  3x/wk x 2 weeks, then 2x/wk x 4 weeks pending progress   PT Duration 6 weeks   PT Treatment/Interventions Therapeutic exercise;Manual techniques;Therapeutic activities;Functional mobility training;Gait training;Stair training;Cryotherapy;Vasopneumatic Device;Electrical  Stimulation;Balance training;Patient/family education   PT Next Visit Plan Review HEP exercises, TKR protocol - bike, stretching with emphasis on extension ROM, hip/knee strengthening   Consulted and Agree with Plan of Care Patient         Problem List Patient Active Problem List   Diagnosis Date Noted  . Primary osteoarthritis of left knee 04/30/2015  . Left knee DJD 04/30/2015  . Pneumonia 11/02/2014  . Hypertension   . Diabetes mellitus   . Hyperlipidemia   . OTHER AND UNSPECIFIED COAGULATION DEFECTS 09/28/2010  . ATRIAL FIBRILLATION 09/28/2010  . PERSONAL HX COLONIC POLYPS 09/28/2010    Percival Spanish, PT, MPT 05/19/2015, 5:26 PM  [facility] [address]  [place], [place], [address] Phone: [phone]   Fax:  [phone]

## 2015-05-20 ENCOUNTER — Ambulatory Visit: Payer: 59 | Admitting: Physical Therapy

## 2015-05-20 DIAGNOSIS — R262 Difficulty in walking, not elsewhere classified: Secondary | ICD-10-CM

## 2015-05-20 DIAGNOSIS — M25562 Pain in left knee: Secondary | ICD-10-CM | POA: Diagnosis not present

## 2015-05-20 DIAGNOSIS — M25662 Stiffness of left knee, not elsewhere classified: Secondary | ICD-10-CM

## 2015-05-20 NOTE — Therapy (Signed)
[facility] [address]  [place], [place], [address] Phone: [phone]   Fax:  [phone]  Physical Therapy Treatment  Patient Details  Name: Dan [name] MRN: [id number] Date of Birth: [birth year] Referring Provider:  Susa Day, MD  Encounter Date: 05/20/2015      PT End of Session - 05/20/15 1100    Visit Number 2   Number of Visits 14   Date for PT Re-Evaluation 06/30/15   PT Start Time 1017   PT Stop Time 1113   PT Time Calculation (min) 56 min   Activity Tolerance Patient tolerated treatment well   Behavior During Therapy [facility] for tasks assessed/performed      Past Medical History  Diagnosis Date  . Chronic atrial fibrillation   . Hypertension   . Hyperlipidemia   . Morbid obesity   . BPH (benign prostatic hypertrophy)   . Diabetes mellitus   . Sleep apnea     CPAP nightly  . Colon polyps     adenomatous  . Diverticulosis   . Hemorrhoids   . Dysrhythmia     tx. Pradaxa- Dr. Martinique follows  . GERD (gastroesophageal reflux disease)   . Arthritis     osteoarthritis left knee    Past Surgical History  Procedure Laterality Date  . US echocardiography  08/08/2006    ef 55-60%  . Knee arthroscopy Left 03/28/2014    Procedure: LEFT KNEE ARTHROSCOPY WITH DEBRIDEMENT  ;  Surgeon: Johnn Hai, MD;  Location: [facility];  Service: Orthopedics;  Laterality: Left;  . Tonsillectomy    . 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  Visit Diagnosis:  Left knee pain  Stiffness of knee joint, left  Difficulty walking      Subjective Assessment - 05/20/15 1022    Subjective Patient arrived to clinic without AD. Reports has not had a chance to attempt any of the HEP exercises provided yesterday at  th eval visit.   Currently in Pain? Yes   Pain Score 4    Pain Location Knee   Pain Orientation Left;Anterior;Lateral;Proximal            [facility] PT Assessment - 05/19/15 1551    Assessment   Medical Diagnosis Left TKR   Onset Date/Surgical Date 04/30/15   Next MD Visit 06/12/15   Prior Therapy HH PT x 6 episodes   Balance Screen   Has the patient fallen in the past 6 months No   Has the patient had a decrease in activity level because of a fear of falling?  No   Is the patient reluctant to leave their home because of a fear of falling?  No   Home Environment   Living Environment Private residence   Type of [place] to enter   Entrance Stairs-Number of Steps 1+1   Home Layout One level   [place] - 2 wheels;Cane - single point;Bedside commode   Prior Function   Level of Independence Independent   Vocation Full time employment   Engineer, manufacturing job  [employer]   Leisure Golf, walk   Observation/Other Assessments   Focus on Therapeutic Outcomes (FOTO)  46% (54% limitation); predicted 62% (38% limited)   ROM /  Strength   AROM / PROM / Strength AROM;PROM;Strength   AROM   AROM Assessment Site Knee   Right/Left Knee Left   Left Knee Extension 21   Left Knee Flexion 106   PROM   PROM Assessment Site Knee   Right/Left Knee Left   Left Knee Extension 12   Left Knee Flexion 112   Strength   Strength Assessment Site Hip;Knee   Right/Left Hip Left;Right   Right Hip Flexion 4+/5   Right Hip Extension 4/5   Right Hip ABduction 4+/5   Left Hip Flexion 4-/5   Left Hip Extension 3+/5   Left Hip ABduction 3+/5   Left Hip ADduction 3+/5   Right/Left Knee Left;Right   Right Knee Flexion 5/5   Right Knee Extension 5/5   Left Knee Flexion 4-/5   Left Knee Extension 3+/5   Palpation   Palpation comment Increased circumferential edema left knee with slight redness noted but temp same as opposite leg   Ambulation/Gait    Ambulation/Gait Assistance 6: Modified independent (Device/Increase time)   Assistive device Straight cane   Gait Pattern Step-through pattern;Left flexed knee in stance;Wide base of support                [facility] Adult PT Treatment/Exercise - 05/20/15 1017    Exercises   Exercises Knee/Hip   Knee/Hip Exercises: Stretches   Passive Hamstring Stretch Left;20 seconds;3 reps   Passive Hamstring Stretch Limitations Supine with strap   Knee: Self-Stretch to increase Flexion Left;20 seconds;3 reps   Knee: Self-Stretch Limitations Step stretch   Gastroc Stretch Left;20 seconds;3 reps   Gastroc Stretch Limitations Seated combined hamstring/gastroc stretch   Knee/Hip Exercises: Aerobic   Recumbent Bike lvl 0 x5"  full revolutions   Knee/Hip Exercises: Standing   Hip Flexion Both;10 reps;Knee bent   Terminal Knee Extension Strengthening;Left;10 reps   Terminal Knee Extension Limitations small ball behind knee   Hip Abduction Both;10 reps;Knee straight   Abduction Limitations 2 pole A   Hip Extension Both;10 reps;Knee straight   Extension Limitations 2 pole A   Lateral Step Up Left;10 reps;Hand Hold: 2;Step Height: 6"   Lateral Step Up Limitations RW for support   Forward Step Up Left;10 reps;Hand Hold: 2;Step Height: 6"   Forward Step Up Limitations RW for support   Functional Squat 10 reps;3 seconds   Functional Squat Limitations TRX   Knee/Hip Exercises: Supine   Short Arc Quad Sets Left;10 reps;2 sets   Hip Adduction Isometric Strengthening;Both;10 reps;2 sets   Hip Adduction Isometric Limitations Hooklying ball squeeze   Bridges Strengthening;Both;10 reps;1 set   Modalities   Modalities Vasopneumatic   Vasopneumatic   Number Minutes Vasopneumatic  15 minutes   Vasopnuematic Location  Knee  Left   Vasopneumatic Pressure Low   Vasopneumatic Temperature  Lowest                PT Education - 05/19/15 1648    Education provided Yes   Education Details Initial HEP  (refer to Pt Instructions); Continued use of ice and pain meds for pain control   Person(s) Educated Patient   Methods Explanation;Demonstration;Handout   Comprehension Verbalized understanding;Returned demonstration;Need further instruction             PT Long Term Goals - 05/19/15 1700    PT LONG TERM GOAL #1   Title Independent with HEP (06/30/15)   Time 6   Period Weeks   Status New   PT LONG TERM GOAL #  2   Title Left knee ROM 3-120 or greater to allow normal motion with gait and stair climbing (06/30/15)   Time 6   Period Weeks   Status New   PT LONG TERM GOAL #3   Title Left knee strength 4/5 or greater for improved stability (06/30/15)   Time 6   Period Weeks   Status New   PT LONG TERM GOAL #4   Title Ambulate on all surfaces without cane with normal gait pattern (06/30/15)   Time 6   Period Weeks   Status New   PT LONG TERM GOAL #5   Title Patient will ascend/descend stairs with or without demonstrating reciprocal pattern (06/30/15)   Time 6   Period Weeks   Status New               Plan - 05/20/15 1103    Clinical Impression Statement Reviewed HEP with emphasis on differentiation on hold time and reps for stretches vs strengthening exercises. Patient tolerating HEP review along with addition of further ROM and strengthening exercises.   PT Next Visit Plan Review HEP exercises if needed, TKR protocol - bike, stretching with emphasis on extension ROM, hip/knee strengthening   Consulted and Agree with Plan of Care Patient        Problem List Patient Active Problem List   Diagnosis Date Noted  . Primary osteoarthritis of left knee 04/30/2015  . Left knee DJD 04/30/2015  . Pneumonia 11/02/2014  . Hypertension   . Diabetes mellitus   . Hyperlipidemia   . OTHER AND UNSPECIFIED COAGULATION DEFECTS 09/28/2010  . ATRIAL FIBRILLATION 09/28/2010  . PERSONAL HX COLONIC POLYPS 09/28/2010    Percival Spanish, PT, MPT 05/20/2015, 11:08 AM  [facility] [address]  [place], [place], [address] Phone: [phone]   Fax:  [phone]

## 2015-05-22 ENCOUNTER — Ambulatory Visit: Payer: 59 | Admitting: Physical Therapy

## 2015-05-22 DIAGNOSIS — R262 Difficulty in walking, not elsewhere classified: Secondary | ICD-10-CM

## 2015-05-22 DIAGNOSIS — M25562 Pain in left knee: Secondary | ICD-10-CM | POA: Diagnosis not present

## 2015-05-22 DIAGNOSIS — R29898 Other symptoms and signs involving the musculoskeletal system: Secondary | ICD-10-CM

## 2015-05-22 DIAGNOSIS — M25662 Stiffness of left knee, not elsewhere classified: Secondary | ICD-10-CM

## 2015-05-22 NOTE — Therapy (Signed)
[facility] [address]  [place], [place], [address] Phone: [phone]   Fax:  [phone]  Physical Therapy Treatment  Patient Details  Name: Dan [name] MRN: [id number] Date of Birth: 1951-09-13 Referring Provider:  Susa Day, MD  Encounter Date: 05/22/2015      PT End of Session - 05/22/15 0807    Visit Number 3   Number of Visits 14   Date for PT Re-Evaluation 06/30/15   PT Start Time 0801   PT Stop Time 0850   PT Time Calculation (min) 49 min   Activity Tolerance Patient tolerated treatment well   Behavior During Therapy [facility] for tasks assessed/performed      Past Medical History  Diagnosis Date  . Chronic atrial fibrillation   . Hypertension   . Hyperlipidemia   . Morbid obesity   . BPH (benign prostatic hypertrophy)   . Diabetes mellitus   . Sleep apnea     CPAP nightly  . Colon polyps     adenomatous  . Diverticulosis   . Hemorrhoids   . Dysrhythmia     tx. Pradaxa- Dr. [place] follows  . GERD (gastroesophageal reflux disease)   . Arthritis     osteoarthritis left knee    Past Surgical History  Procedure Laterality Date  . US echocardiography  08/08/2006    ef 55-60%  . Knee arthroscopy Left 03/28/2014    Procedure: LEFT KNEE ARTHROSCOPY WITH DEBRIDEMENT  ;  Surgeon: Johnn Hai, MD;  Location: [facility];  Service: Orthopedics;  Laterality: Left;  . Tonsillectomy    . 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  Visit Diagnosis:  Left knee pain  Stiffness of knee joint, left  Difficulty walking  Weakness of left leg      Subjective Assessment - 05/22/15 0804    Subjective Reports completing HEP yesterday but uncertain if getting getting enough ROM.   Currently in Pain?  Yes   Pain Score 3    Pain Location Knee   Pain Orientation Left;Anterior;Lateral   Pain Descriptors / Indicators Throbbing                 [facility] Adult PT Treatment/Exercise - 05/22/15 0801    Exercises   Exercises Knee/Hip   Knee/Hip Exercises: Aerobic   Recumbent Bike lvl 1 x5"   Knee/Hip Exercises: Standing   Hip Flexion Both;10 reps;Knee bent   Hip Flexion Limitations Marching on blue foam with 2 pole A   Terminal Knee Extension Strengthening;Left;10 reps;2 sets   Terminal Knee Extension Limitations 5" hold with small ball behind knee   Hip Abduction Both;10 reps;Knee straight   Abduction Limitations Standing on blue foam with 2 pole A   Hip Extension Both;10 reps;Knee straight   Extension Limitations Standing on blue foam with 2 pole A   Lateral Step Up Left;10 reps;Step Height: 6";Hand Hold: 2   Lateral Step Up Limitations 2 pole A   Forward Step Up Left;10 reps;Step Height: 6";Hand Hold: 1   Forward Step Up Limitations 1 pole A   Functional Squat 15 reps;3 seconds   Functional Squat Limitations TRX   Knee/Hip Exercises: Supine   Short Arc Quad Sets Strengthening;Left;10 reps;2 sets   Short Arc Quad Sets Limitations 2# cuff  weight   Heel Slides AAROM;10 reps;2 sets   Heel Slides Limitations 3" hold with fett on peanut ball   Hip Adduction Isometric Strengthening;Both;10 reps;2 sets   Hip Adduction Isometric Limitations Hooklying ball squeeze   Straight Leg Raises Strengthening;Left;10 reps   Straight Leg Raises Limitations 2# cuff weight   Modalities   Modalities Vasopneumatic   Vasopneumatic   Number Minutes Vasopneumatic  8 minutes  Patient requested to cut time short secondary too cold   Vasopnuematic Location  Knee  Left   Vasopneumatic Pressure Low;Medium   Vasopneumatic Temperature  Lowest                     PT Long Term Goals - 05/22/15 0847    PT LONG TERM GOAL #1   Title Independent with HEP (06/30/15)   Status On-going   PT  LONG TERM GOAL #2   Title Left knee ROM 3-120 or greater to allow normal motion with gait and stair climbing (06/30/15)   Status On-going   PT LONG TERM GOAL #3   Title Left knee strength 4/5 or greater for improved stability (06/30/15)   Status On-going   PT LONG TERM GOAL #4   Title Ambulate on all surfaces without cane with normal gait pattern (06/30/15)   Status On-going   PT LONG TERM GOAL #5   Title Patient will ascend/descend stairs with or without demonstrating reciprocal pattern (06/30/15)   Status On-going               Plan - 05/22/15 4034    Clinical Impression Statement Decreasing edema noted with improving quad control. Good initial compliance with HEP with confirmation provided for proper exercise technique. Tolerating progression of exercises with increased resistance and/or reps without complaints. Good initial progress with therapy.   PT Next Visit Plan TKR protocol - bike, stretching with emphasis on extension ROM, hip/knee strengthening, patellar mobs, scar mobilization, add extra pillowcase under vasopneumatic   Consulted and Agree with Plan of Care Patient        Problem List Patient Active Problem List   Diagnosis Date Noted  . Primary osteoarthritis of left knee 04/30/2015  . Left knee DJD 04/30/2015  . Pneumonia 11/02/2014  . Hypertension   . Diabetes mellitus   . Hyperlipidemia   . OTHER AND UNSPECIFIED COAGULATION DEFECTS 09/28/2010  . ATRIAL FIBRILLATION 09/28/2010  . PERSONAL HX COLONIC POLYPS 09/28/2010    Percival Spanish, PT, MPT 05/22/2015, 8:55 AM  [facility] [address]  [place], [place], [address] Phone: [phone]   Fax:  [phone]

## 2015-05-25 ENCOUNTER — Ambulatory Visit: Payer: 59 | Admitting: Physical Therapy

## 2015-05-25 DIAGNOSIS — M25662 Stiffness of left knee, not elsewhere classified: Secondary | ICD-10-CM

## 2015-05-25 DIAGNOSIS — M25562 Pain in left knee: Secondary | ICD-10-CM

## 2015-05-25 DIAGNOSIS — R262 Difficulty in walking, not elsewhere classified: Secondary | ICD-10-CM

## 2015-05-25 DIAGNOSIS — R29898 Other symptoms and signs involving the musculoskeletal system: Secondary | ICD-10-CM

## 2015-05-25 NOTE — Therapy (Signed)
[facility] [address]  [place], [place], [address] Phone: [phone]   Fax:  [phone]  Physical Therapy Treatment  Patient Details  Name: Dan [name] MRN: [id number] Date of Birth: Jul 04, 1951 Referring Provider:  Susa Day, MD  Encounter Date: 05/25/2015      PT End of Session - 05/25/15 1750    Visit Number 4   Number of Visits 14   Date for PT Re-Evaluation 06/30/15   PT Start Time 1701   PT Stop Time 1756   PT Time Calculation (min) 55 min   Activity Tolerance Patient tolerated treatment well   Behavior During Therapy [facility] for tasks assessed/performed      Past Medical History  Diagnosis Date  . Chronic atrial fibrillation   . Hypertension   . Hyperlipidemia   . Morbid obesity   . BPH (benign prostatic hypertrophy)   . Diabetes mellitus   . Sleep apnea     CPAP nightly  . Colon polyps     adenomatous  . Diverticulosis   . Hemorrhoids   . Dysrhythmia     tx. Pradaxa- Dr. Martinique follows  . GERD (gastroesophageal reflux disease)   . Arthritis     osteoarthritis left knee    Past Surgical History  Procedure Laterality Date  . US echocardiography  08/08/2006    ef 55-60%  . Knee arthroscopy Left 03/28/2014    Procedure: LEFT KNEE ARTHROSCOPY WITH DEBRIDEMENT  ;  Surgeon: Johnn Hai, MD;  Location: [facility];  Service: Orthopedics;  Laterality: Left;  . Tonsillectomy    . 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  Visit Diagnosis:  Left knee pain  Stiffness of knee joint, left  Difficulty walking  Weakness of left leg      Subjective Assessment - 05/25/15 1705    Subjective Patient reports he feels like he is walking better with less of a limp. Taking less pain meds (not  usually needing one at night), and meds give good relief during day. States completing HEP 2x/day.   Patient Stated Goals "To get more normal movement back in my knee without pain."   Currently in Pain? Yes   Pain Score 4    Pain Location Knee   Pain Orientation Left            [facility] PT Assessment - 05/25/15 1701    ROM / Strength   AROM / PROM / Strength AROM;PROM   AROM   AROM Assessment Site Knee   Right/Left Knee Left   Left Knee Extension 18   Left Knee Flexion 115   PROM   PROM Assessment Site Knee   Right/Left Knee Left   Left Knee Extension 10   Left Knee Flexion 120                [facility] Adult PT Treatment/Exercise - 05/25/15 1701    Exercises   Exercises Knee/Hip   Knee/Hip Exercises: Aerobic   Recumbent Bike lvl 1 x6"   Knee/Hip Exercises: Standing   Hip Flexion Both;10 reps;Knee bent   Hip Flexion Limitations Marching on blue foam with 2 pole A   Terminal Knee Extension Strengthening;Left;10 reps;2 sets;Theraband   Theraband Level (Terminal Knee Extension) Level 4 (Blue)  Terminal Knee Extension Limitations 5" hold with small ball behind knee   Hip Abduction Both;10 reps;Knee straight   Abduction Limitations Alternating legs - Standing on blue foam with 2 pole A   Hip Extension Both;10 reps;Knee straight   Extension Limitations Alternating legs - Standing on blue foam with 2 pole A   Lateral Step Up Left;10 reps;Hand Hold: 2;Step Height: 6"   Lateral Step Up Limitations 2 pole A   Forward Step Up Left;10 reps;Hand Hold: 2;Step Height: 8"   Forward Step Up Limitations 2 pole A   Functional Squat 15 reps;3 seconds   Functional Squat Limitations TRX   Knee/Hip Exercises: Supine   Short Arc Quad Sets Strengthening;Left;10 reps;2 sets   Short Arc Quad Sets Limitations 2#, 5" hold   Heel Slides AAROM;10 reps;2 sets   Heel Slides Limitations 3" hold with feet on peanut ball   Hip Adduction Isometric Strengthening;Both;10 reps;2 sets   Hip Adduction  Isometric Limitations Hooklying ball squeeze   Bridges Strengthening;Both;10 reps;1 set   Straight Leg Raises Strengthening;Left;10 reps;2 sets   Straight Leg Raises Limitations 2#, 3" hold   Modalities   Modalities Vasopneumatic   Vasopneumatic   Number Minutes Vasopneumatic  8 minutes  Time set for 10 min, but stopped after 8 min d/t discomfort   Vasopnuematic Location  Knee   Vasopneumatic Pressure Medium   Vasopneumatic Temperature  Lowest   Manual Therapy   Manual Therapy Joint mobilization;Passive ROM   Joint Mobilization Patellar mobs - sup/inf, med/lat   Passive ROM Knee extension - Contract/relax into extension                 PT Long Term Goals - 05/22/15 0847    PT LONG TERM GOAL #1   Title Independent with HEP (06/30/15)   Status On-going   PT LONG TERM GOAL #2   Title Left knee ROM 3-120 or greater to allow normal motion with gait and stair climbing (06/30/15)   Status On-going   PT LONG TERM GOAL #3   Title Left knee strength 4/5 or greater for improved stability (06/30/15)   Status On-going   PT LONG TERM GOAL #4   Title Ambulate on all surfaces without cane with normal gait pattern (06/30/15)   Status On-going   PT LONG TERM GOAL #5   Title Patient will ascend/descend stairs with or without demonstrating reciprocal pattern (06/30/15)   Status On-going               Plan - 05/25/15 1759    Clinical Impression Statement Left knee flexion ROM progressing well, but remains limited with extension both actively and passively. Educated patient on proper positioning of left leg at rest to promote positional stretching into extension (propping pillows under heel only with knee extended and avoiding ER at hip). Manual therapy focused in improving knee extension ROM.   PT Next Visit Plan TKR protocol - bike, stretching with emphasis on extension ROM, hip/knee strengthening, patellar mobs, scar mobilization, add extra pillowcase under vasopneumatic sleeve    Consulted and Agree with Plan of Care Patient        Problem List Patient Active Problem List   Diagnosis Date Noted  . Primary osteoarthritis of left knee 04/30/2015  . Left knee DJD 04/30/2015  . Pneumonia 11/02/2014  . Hypertension   . Diabetes mellitus   . Hyperlipidemia   . OTHER AND UNSPECIFIED COAGULATION DEFECTS 09/28/2010  . ATRIAL FIBRILLATION 09/28/2010  . PERSONAL HX COLONIC POLYPS 09/28/2010  Percival Spanish, PT, MPT 05/25/2015, 6:08 PM  [facility] [address]  [place], [place], [address] Phone: [phone]   Fax:  [phone]

## 2015-05-27 ENCOUNTER — Ambulatory Visit: Payer: 59 | Admitting: Rehabilitation

## 2015-05-27 DIAGNOSIS — M25562 Pain in left knee: Secondary | ICD-10-CM

## 2015-05-27 DIAGNOSIS — R29898 Other symptoms and signs involving the musculoskeletal system: Secondary | ICD-10-CM

## 2015-05-27 DIAGNOSIS — R262 Difficulty in walking, not elsewhere classified: Secondary | ICD-10-CM

## 2015-05-27 DIAGNOSIS — M25662 Stiffness of left knee, not elsewhere classified: Secondary | ICD-10-CM

## 2015-05-27 NOTE — Therapy (Signed)
[facility] [address]  [place], [place], [address] Phone: [phone]   Fax:  [phone]  Physical Therapy Treatment  Patient Details  Name: TAYTE MCWHERTER MRN: [id number] Date of Birth: 1950/12/12 Referring Provider:  Susa Day, MD  Encounter Date: 05/27/2015      PT End of Session - 05/27/15 1449    Visit Number 5   Number of Visits 14   Date for PT Re-Evaluation 06/30/15   PT Start Time 8466   PT Stop Time 1524   PT Time Calculation (min) 38 min      Past Medical History  Diagnosis Date  . Chronic atrial fibrillation   . Hypertension   . Hyperlipidemia   . Morbid obesity   . BPH (benign prostatic hypertrophy)   . Diabetes mellitus   . Sleep apnea     CPAP nightly  . Colon polyps     adenomatous  . Diverticulosis   . Hemorrhoids   . Dysrhythmia     tx. Pradaxa- Dr. Martinique follows  . GERD (gastroesophageal reflux disease)   . Arthritis     osteoarthritis left knee    Past Surgical History  Procedure Laterality Date  . US echocardiography  08/08/2006    ef 55-60%  . Knee arthroscopy Left 03/28/2014    Procedure: LEFT KNEE ARTHROSCOPY WITH DEBRIDEMENT  ;  Surgeon: Johnn Hai, MD;  Location: [facility];  Service: Orthopedics;  Laterality: Left;  . Tonsillectomy    . 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  Visit Diagnosis:  Left knee pain  Stiffness of knee joint, left  Difficulty walking  Weakness of left leg      Subjective Assessment - 05/27/15 1448    Subjective States he is doing ok, pain is still there but his mobility is getting better.    Currently in Pain? Yes   Pain Score 4    Pain Location Knee   Pain Orientation Left;Medial;Lateral                          [facility] Adult PT Treatment/Exercise - 05/27/15 1449    Exercises   Exercises Knee/Hip   Knee/Hip Exercises: Aerobic   Recumbent Bike lvl 1 x6"   Knee/Hip Exercises: Machines for Strengthening   Cybex Knee Extension DL 10# x10, SL 10# x10   Knee/Hip Exercises: Standing   Terminal Knee Extension Strengthening;Left;15 reps   Terminal Knee Extension Limitations 5" hold with small ball behind knee   Lateral Step Up Left;15 reps;Hand Hold: 2;Step Height: 8"   Lateral Step Up Limitations 2 pole A   Forward Step Up Left;15 reps;Hand Hold: 2;Step Height: 8"   Forward Step Up Limitations 2 pole A   Step Down Left;10 reps;Hand Hold: 2;Step Height: 6"   Step Down Limitations Eccentric reach downs, 2 pole A   Functional Squat 15 reps;3 seconds   Functional Squat Limitations TRX   Knee/Hip Exercises: Supine   Quad Sets Left;10 reps   Quad Sets Limitations 5" hold with black bolster under knee   Short Arc Quad Sets Strengthening;Left;10 reps;2 sets   Short Arc Quad Sets Limitations 3#, 5" hold   Manual Therapy   Manual Therapy Joint mobilization;Passive ROM  Joint Mobilization Grade 1-2 knee extension mobs with knee straight, patellar mobs   Passive ROM Into knee extension                     PT Long Term Goals - 05/22/15 0847    PT LONG TERM GOAL #1   Title Independent with HEP (06/30/15)   Status On-going   PT LONG TERM GOAL #2   Title Left knee ROM 3-120 or greater to allow normal motion with gait and stair climbing (06/30/15)   Status On-going   PT LONG TERM GOAL #3   Title Left knee strength 4/5 or greater for improved stability (06/30/15)   Status On-going   PT LONG TERM GOAL #4   Title Ambulate on all surfaces without cane with normal gait pattern (06/30/15)   Status On-going   PT LONG TERM GOAL #5   Title Patient will ascend/descend stairs with or without demonstrating reciprocal pattern (06/30/15)   Status On-going                Plan - 05/27/15 1520    Clinical Impression Statement Pt declined ice today stating he would ice at work. Continues to have difficulty with extension exercises due to limited motion. Good tolerance to all exercises and increased reps today without complaint of pain.    PT Next Visit Plan TKR protocol - bike, stretching with emphasis on extension ROM, hip/knee strengthening, patellar mobs, scar mobilization, add extra pillowcase under vasopneumatic sleeve   Consulted and Agree with Plan of Care Patient        Problem List Patient Active Problem List   Diagnosis Date Noted  . Primary osteoarthritis of left knee 04/30/2015  . Left knee DJD 04/30/2015  . Pneumonia 11/02/2014  . Hypertension   . Diabetes mellitus   . Hyperlipidemia   . OTHER AND UNSPECIFIED COAGULATION DEFECTS 09/28/2010  . ATRIAL FIBRILLATION 09/28/2010  . PERSONAL HX COLONIC POLYPS 09/28/2010    Barbette Hair, PTA 05/27/2015, 3:24 PM  [facility] [address]  [place], [place], [address] Phone: [phone]   Fax:  [phone]

## 2015-05-29 ENCOUNTER — Ambulatory Visit: Payer: 59 | Admitting: Rehabilitation

## 2015-05-29 DIAGNOSIS — M25562 Pain in left knee: Secondary | ICD-10-CM | POA: Diagnosis not present

## 2015-05-29 DIAGNOSIS — R29898 Other symptoms and signs involving the musculoskeletal system: Secondary | ICD-10-CM

## 2015-05-29 DIAGNOSIS — R262 Difficulty in walking, not elsewhere classified: Secondary | ICD-10-CM

## 2015-05-29 DIAGNOSIS — M25662 Stiffness of left knee, not elsewhere classified: Secondary | ICD-10-CM

## 2015-05-29 NOTE — Therapy (Signed)
[facility] [address]  [place], [place], [address] Phone: [phone]   Fax:  [phone]  Physical Therapy Treatment  Patient Details  Name: Dan [name] MRN: [id number] Date of Birth: 1950-12-12 Referring Provider:  Susa Day, MD  Encounter Date: 05/29/2015      PT End of Session - 05/29/15 0856    Visit Number 6   Number of Visits 14   Date for PT Re-Evaluation 06/30/15   PT Start Time 3825   PT Stop Time 0925   PT Time Calculation (min) 30 min      Past Medical History  Diagnosis Date  . Chronic atrial fibrillation   . Hypertension   . Hyperlipidemia   . Morbid obesity   . BPH (benign prostatic hypertrophy)   . Diabetes mellitus   . Sleep apnea     CPAP nightly  . Colon polyps     adenomatous  . Diverticulosis   . Hemorrhoids   . Dysrhythmia     tx. Pradaxa- Dr. Martinique follows  . GERD (gastroesophageal reflux disease)   . Arthritis     osteoarthritis left knee    Past Surgical History  Procedure Laterality Date  . US echocardiography  08/08/2006    ef 55-60%  . Knee arthroscopy Left 03/28/2014    Procedure: LEFT KNEE ARTHROSCOPY WITH DEBRIDEMENT  ;  Surgeon: Johnn Hai, MD;  Location: [facility];  Service: Orthopedics;  Laterality: Left;  . Tonsillectomy    . 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  Visit Diagnosis:  Left knee pain  Stiffness of knee joint, left  Difficulty walking  Weakness of left leg      Subjective Assessment - 05/29/15 0855    Subjective Reports soreness for a while after last time but did ok. Notes stiffness this morning.    Currently in Pain? Yes   Pain Score 5    Pain Location Knee   Pain Orientation Left;Medial;Lateral   Pain Descriptors /  Indicators Tightness  soreness                         [facility] Adult PT Treatment/Exercise - 05/29/15 0857    Exercises   Exercises Knee/Hip   Knee/Hip Exercises: Aerobic   Nustep level 5x5'  Stationary Bike in use   Knee/Hip Exercises: Machines for Strengthening   Cybex Knee Extension DL 15# x10   Knee/Hip Exercises: Standing   Hip Flexion Both;10 reps;Knee bent   Hip Flexion Limitations Marching on blue foam with 2 pole A   Terminal Knee Extension Strengthening;Left;15 reps   Terminal Knee Extension Limitations 5" hold with small ball behind knee   Forward Step Up Left;15 reps;Hand Hold: 2;Step Height: 8"   Forward Step Up Limitations 2 pole A   Functional Squat 15 reps;3 seconds   Functional Squat Limitations TRX   Knee/Hip Exercises: Supine   Short Arc Quad Sets Strengthening;Left;2 sets;15 reps   Short Arc Quad Sets Limitations 3#, 5" hold    Bridges Strengthening;Both;10 reps;1 Advice worker Limitations 3" hold   Straight Leg Raises Strengthening;Left;10 reps   Straight Leg Raises Limitations 3#, 3" hold   Manual Therapy   Joint Mobilization Grade 1-2 knee extension mobs  with knee straight, patellar mobs   Passive ROM Into knee extension                     PT Long Term Goals - 05/22/15 0847    PT LONG TERM GOAL #1   Title Independent with HEP (06/30/15)   Status On-going   PT LONG TERM GOAL #2   Title Left knee ROM 3-120 or greater to allow normal motion with gait and stair climbing (06/30/15)   Status On-going   PT LONG TERM GOAL #3   Title Left knee strength 4/5 or greater for improved stability (06/30/15)   Status On-going   PT LONG TERM GOAL #4   Title Ambulate on all surfaces without cane with normal gait pattern (06/30/15)   Status On-going   PT LONG TERM GOAL #5   Title Patient will ascend/descend stairs with or without demonstrating reciprocal pattern (06/30/15)   Status On-going               Plan - 05/29/15 0901     Clinical Impression Statement Good tolerance to all exercises without complaint of pain. Pt able to push ROM with TR Squats and continues to lag with knee extension.     PT Next Visit Plan TKR protocol - bike, stretching with emphasis on extension ROM, hip/knee strengthening, patellar mobs, scar mobilization, add extra pillowcase under vasopneumatic sleeve   Consulted and Agree with Plan of Care Patient        Problem List Patient Active Problem List   Diagnosis Date Noted  . Primary osteoarthritis of left knee 04/30/2015  . Left knee DJD 04/30/2015  . Pneumonia 11/02/2014  . Hypertension   . Diabetes mellitus   . Hyperlipidemia   . OTHER AND UNSPECIFIED COAGULATION DEFECTS 09/28/2010  . ATRIAL FIBRILLATION 09/28/2010  . PERSONAL HX COLONIC POLYPS 09/28/2010    Barbette Hair, PTA 05/29/2015, 9:25 AM  [facility] [address]  [place], [place], [address] Phone: [phone]   Fax:  [phone]

## 2015-06-02 ENCOUNTER — Ambulatory Visit: Payer: 59 | Admitting: Physical Therapy

## 2015-06-02 DIAGNOSIS — R262 Difficulty in walking, not elsewhere classified: Secondary | ICD-10-CM

## 2015-06-02 DIAGNOSIS — R29898 Other symptoms and signs involving the musculoskeletal system: Secondary | ICD-10-CM

## 2015-06-02 DIAGNOSIS — M25562 Pain in left knee: Secondary | ICD-10-CM | POA: Diagnosis not present

## 2015-06-02 DIAGNOSIS — M25662 Stiffness of left knee, not elsewhere classified: Secondary | ICD-10-CM

## 2015-06-02 NOTE — Therapy (Signed)
[facility] [address]  [place], [place], [address] Phone: [phone]   Fax:  [phone]  Physical Therapy Treatment  Patient Details  Name: Dan [name] MRN: [id number] Date of Birth: Jan 19, 1951 Referring Provider:  Susa Day, MD  Encounter Date: 06/02/2015      PT End of Session - 06/02/15 1448    Visit Number 7   Number of Visits 14   Date for PT Re-Evaluation 06/30/15   PT Start Time 8841   PT Stop Time 1541   PT Time Calculation (min) 58 min   Activity Tolerance Patient tolerated treatment well   Behavior During Therapy [facility] for tasks assessed/performed      Past Medical History  Diagnosis Date  . Chronic atrial fibrillation   . Hypertension   . Hyperlipidemia   . Morbid obesity   . BPH (benign prostatic hypertrophy)   . Diabetes mellitus   . Sleep apnea     CPAP nightly  . Colon polyps     adenomatous  . Diverticulosis   . Hemorrhoids   . Dysrhythmia     tx. Pradaxa- Dr. Martinique follows  . GERD (gastroesophageal reflux disease)   . Arthritis     osteoarthritis left knee    Past Surgical History  Procedure Laterality Date  . US echocardiography  08/08/2006    ef 55-60%  . Knee arthroscopy Left 03/28/2014    Procedure: LEFT KNEE ARTHROSCOPY WITH DEBRIDEMENT  ;  Surgeon: Johnn Hai, MD;  Location: [facility];  Service: Orthopedics;  Laterality: Left;  . Tonsillectomy    . 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  Visit Diagnosis:  Left knee pain  Stiffness of knee joint, left  Difficulty walking  Weakness of left leg      Subjective Assessment - 06/02/15 1446    Subjective Patient reports "doubling what you had me doing at home" (HEP) with no adverse effects, but does admit  to continued swelling. States walking ~1 mile in the mornings - "feels good while I'm walking but a bit throbby afterwards."   Currently in Pain? Yes   Pain Score 3    Pain Location Knee   Pain Orientation Left            [facility] PT Assessment - 06/02/15 1443    ROM / Strength   AROM / PROM / Strength AROM;Strength   AROM   AROM Assessment Site Knee   Right/Left Knee Left   Left Knee Extension 12   Left Knee Flexion 119   PROM   PROM Assessment Site Knee   Right/Left Knee Left   Left Knee Extension 6  after PROM/joint mobs/stretching   Left Knee Flexion 122   Strength   Left Hip Flexion 4/5   Left Hip Extension 4-/5   Left Hip ABduction 4/5   Left Hip ADduction 4/5   Left Knee Flexion 4/5   Left Knee Extension 4-/5                     [facility] Adult PT Treatment/Exercise - 06/02/15 1443    Exercises   Exercises Knee/Hip   Knee/Hip Exercises: Aerobic   Recumbent Bike lvl 2 x6"   Knee/Hip Exercises: Machines for Strengthening  Cybex Knee Extension DL 15# x10   Cybex Knee Flexion DL #15 x10, SL #10 x10    Cybex Leg Press DL 20# 2x10   Knee/Hip Exercises: Standing   Lateral Step Up Left;15 reps;Hand Hold: 2   Lateral Step Up Limitations BOSU with HHA on counter   Forward Step Up Left;15 reps;Hand Hold: 2   Forward Step Up Limitations BOSU with HHA on counter   Functional Squat 20 reps   Functional Squat Limitations TRX   Modalities   Modalities Vasopneumatic   Vasopneumatic   Number Minutes Vasopneumatic  10 minutes   Vasopnuematic Location  Knee   Vasopneumatic Pressure Medium   Vasopneumatic Temperature  Lowest   Manual Therapy   Manual Therapy Joint mobilization;Passive ROM   Joint Mobilization Grade 1-2 knee extension mobs with knee straight, patellar mobs   Passive ROM Into knee extension                     PT Long Term Goals - 06/02/15 1646    PT LONG TERM GOAL #1   Title Independent with HEP (06/30/15)   Status On-going   PT  LONG TERM GOAL #2   Title Left knee ROM 3-120 or greater to allow normal motion with gait and stair climbing (06/30/15)   Status On-going   PT LONG TERM GOAL #3   Title Left knee strength 4/5 or greater for improved stability (06/30/15)   Status On-going   PT LONG TERM GOAL #4   Title Ambulate on all surfaces without cane with normal gait pattern (06/30/15)   Status On-going   PT LONG TERM GOAL #5   Title Patient will ascend/descend stairs with or without demonstrating reciprocal pattern (06/30/15)   Status On-going               Plan - 06/02/15 1643    Clinical Impression Statement Improving left quad control with AROM more equivalent to PROM although continues to lack end range extension ROM, therefore focused exercises, STM and joint mobs on increasing extension. Cautioned patient on overdoing exercises at home leading to increased inflammation and stiffness/tightness.   PT Next Visit Plan TKR protocol - bike, stretching with emphasis on extension ROM, hip/knee strengthening, patellar mobs, scar mobilization, add extra pillowcase under vasopneumatic sleeve   Consulted and Agree with Plan of Care Patient        Problem List Patient Active Problem List   Diagnosis Date Noted  . Primary osteoarthritis of left knee 04/30/2015  . Left knee DJD 04/30/2015  . Pneumonia 11/02/2014  . Hypertension   . Diabetes mellitus   . Hyperlipidemia   . OTHER AND UNSPECIFIED COAGULATION DEFECTS 09/28/2010  . ATRIAL FIBRILLATION 09/28/2010  . PERSONAL HX COLONIC POLYPS 09/28/2010    Percival Spanish, PT, MPT 06/02/2015, 4:50 PM  [facility] [address]  [place], [place], [address] Phone: [phone]   Fax:  [phone]

## 2015-06-05 ENCOUNTER — Ambulatory Visit: Payer: 59 | Attending: Specialist | Admitting: Rehabilitation

## 2015-06-05 DIAGNOSIS — R262 Difficulty in walking, not elsewhere classified: Secondary | ICD-10-CM | POA: Diagnosis present

## 2015-06-05 DIAGNOSIS — M25562 Pain in left knee: Secondary | ICD-10-CM | POA: Diagnosis present

## 2015-06-05 DIAGNOSIS — R29898 Other symptoms and signs involving the musculoskeletal system: Secondary | ICD-10-CM

## 2015-06-05 DIAGNOSIS — M25662 Stiffness of left knee, not elsewhere classified: Secondary | ICD-10-CM | POA: Diagnosis present

## 2015-06-05 NOTE — Therapy (Signed)
[facility] [address]  [place], [place], [address] Phone: [phone]   Fax:  [phone]  Physical Therapy Treatment  Patient Details  Name: Dan [name] MRN: [id number] Date of Birth: 1951/07/16 Referring Provider:  Susa Day, MD  Encounter Date: 06/05/2015      PT End of Session - 06/05/15 0847    Visit Number 8   Number of Visits 14   Date for PT Re-Evaluation 06/30/15   PT Start Time 0845   PT Stop Time 0938   PT Time Calculation (min) 53 min      Past Medical History  Diagnosis Date  . Chronic atrial fibrillation   . Hypertension   . Hyperlipidemia   . Morbid obesity   . BPH (benign prostatic hypertrophy)   . Diabetes mellitus   . Sleep apnea     CPAP nightly  . Colon polyps     adenomatous  . Diverticulosis   . Hemorrhoids   . Dysrhythmia     tx. Pradaxa- Dr. Martinique follows  . GERD (gastroesophageal reflux disease)   . Arthritis     osteoarthritis left knee    Past Surgical History  Procedure Laterality Date  . US echocardiography  08/08/2006    ef 55-60%  . Knee arthroscopy Left 03/28/2014    Procedure: LEFT KNEE ARTHROSCOPY WITH DEBRIDEMENT  ;  Surgeon: Johnn Hai, MD;  [facility]: [facility];  Service: Orthopedics;  Laterality: Left;  . Tonsillectomy    . 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  Visit Diagnosis:  Left knee pain  Stiffness of knee joint, left  Difficulty walking  Weakness of left leg      Subjective Assessment - 06/05/15 0847    Subjective Pt reports no pain since he just took some pain medication. He states he felt worked out after last time and that his swelling has gone down a little. Sees MD next Thursday.    Currently in Pain? No/denies                          [facility] Adult PT Treatment/Exercise - 06/05/15 0848    Exercises   Exercises Knee/Hip   Knee/Hip Exercises: Stretches   Passive Hamstring Stretch Left;20 seconds;3 reps   Passive Hamstring Stretch Limitations manually    Knee/Hip Exercises: Aerobic   Recumbent Bike lvl 2 x6"   Knee/Hip Exercises: Machines for Strengthening   Cybex Knee Extension DL 15# 2x10   Cybex Knee Flexion DL 20# 2x10   Cybex Leg Press DL 20# 2x10   Knee/Hip Exercises: Standing   Terminal Knee Extension Strengthening;Left;15 reps   Terminal Knee Extension Limitations 5" hold with small ball behind knee   Step Down Left;2 sets;10 reps;Hand Hold: 2;Step Height: 6"   Step Down Limitations Eccentric reach downs, 2 pole A   Functional Squat 20 reps   Functional Squat Limitations TRX   Modalities   Modalities Vasopneumatic   Vasopneumatic   Number Minutes Vasopneumatic  10 minutes   Vasopnuematic Location  Knee   Vasopneumatic Pressure Medium   Vasopneumatic Temperature  Lowest   Manual Therapy   Manual Therapy Joint mobilization;Passive ROM   Joint Mobilization Grade 1-2 knee extension mobs with knee straight,  patellar mobs   Passive ROM Into knee extension                     PT Long Term Goals - 06/02/15 1646    PT LONG TERM GOAL #1   Title Independent with HEP (06/30/15)   Status On-going   PT LONG TERM GOAL #2   Title Left knee ROM 3-120 or greater to allow normal motion with gait and stair climbing (06/30/15)   Status On-going   PT LONG TERM GOAL #3   Title Left knee strength 4/5 or greater for improved stability (06/30/15)   Status On-going   PT LONG TERM GOAL #4   Title Ambulate on all surfaces without cane with normal gait pattern (06/30/15)   Status On-going   PT LONG TERM GOAL #5   Title Patient will ascend/descend stairs with or without demonstrating reciprocal pattern (06/30/15)   Status On-going               Plan - 06/05/15 0906     Clinical Impression Statement Continued with extension/quad work during exercises and manual work. Increased reps today with good tolerance and no complaint of pain. Pt stated he sees the MD next Thursday and will plan on writting a note next visit (Tuesday).    PT Next Visit Plan MD note! TKR protocol - bike, stretching with emphasis on extension ROM, hip/knee strengthening, patellar mobs, scar mobilization, add extra pillowcase under vasopneumatic sleeve        Problem List Patient Active Problem List   Diagnosis Date Noted  . Primary osteoarthritis of left knee 04/30/2015  . Left knee DJD 04/30/2015  . Pneumonia 11/02/2014  . Hypertension   . Diabetes mellitus   . Hyperlipidemia   . OTHER AND UNSPECIFIED COAGULATION DEFECTS 09/28/2010  . ATRIAL FIBRILLATION 09/28/2010  . PERSONAL HX COLONIC POLYPS 09/28/2010    Barbette Hair, PTA 06/05/2015, 9:39 AM  [facility] [address]  [place], [place], [address] Phone: [phone]   Fax:  [phone]

## 2015-06-09 ENCOUNTER — Ambulatory Visit: Payer: 59 | Admitting: Physical Therapy

## 2015-06-09 DIAGNOSIS — R29898 Other symptoms and signs involving the musculoskeletal system: Secondary | ICD-10-CM

## 2015-06-09 DIAGNOSIS — R262 Difficulty in walking, not elsewhere classified: Secondary | ICD-10-CM

## 2015-06-09 DIAGNOSIS — M25662 Stiffness of left knee, not elsewhere classified: Secondary | ICD-10-CM

## 2015-06-09 DIAGNOSIS — M25562 Pain in left knee: Secondary | ICD-10-CM | POA: Diagnosis not present

## 2015-06-09 NOTE — Therapy (Signed)
[facility] [address]  [place], [place], [address] Phone: [phone]   Fax:  [phone]  Physical Therapy Treatment  Patient Details  Name: Dan [name] MRN: [id number] Date of Birth: Apr 08, 1951 Referring Provider:  Susa Day, MD  Encounter Date: 06/09/2015      PT End of Session - 06/09/15 1619    Visit Number 9   Number of Visits 14   Date for PT Re-Evaluation 06/30/15   PT Start Time 3903   PT Stop Time 1617   PT Time Calculation (min) 46 min   Activity Tolerance Patient limited by pain  Patient caught heel while descending stairs resulting in a left ITB pull   Behavior During Therapy [facility] for tasks assessed/performed      Past Medical History  Diagnosis Date  . Chronic atrial fibrillation   . Hypertension   . Hyperlipidemia   . Morbid obesity   . BPH (benign prostatic hypertrophy)   . Diabetes mellitus   . Sleep apnea     CPAP nightly  . Colon polyps     adenomatous  . Diverticulosis   . Hemorrhoids   . Dysrhythmia     tx. Pradaxa- Dr. Martinique follows  . GERD (gastroesophageal reflux disease)   . Arthritis     osteoarthritis left knee    Past Surgical History  Procedure Laterality Date  . US echocardiography  08/08/2006    ef 55-60%  . Knee arthroscopy Left 03/28/2014    Procedure: LEFT KNEE ARTHROSCOPY WITH DEBRIDEMENT  ;  Surgeon: Johnn Hai, MD;  Location: [facility];  Service: Orthopedics;  Laterality: Left;  . Tonsillectomy    . 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  Visit Diagnosis:  Left knee pain  Stiffness of knee joint, left  Difficulty walking  Weakness of left leg      Subjective Assessment - 06/09/15 1535    Subjective Patient reports a quiet long weekend.  No new complaints.   Currently in Pain? Yes   Pain Score 3    Pain Location Knee   Pain Orientation Left;Anterior   Pain Descriptors / Indicators --  "Pinging pain"            [facility] PT Assessment - 06/09/15 1531    ROM / Strength   AROM / PROM / Strength AROM   AROM   AROM Assessment Site Knee   Right/Left Knee Left   Left Knee Extension 10   Left Knee Flexion 124   PROM   PROM Assessment Site Knee   Right/Left Knee Left   Left Knee Extension 4  after PROM/joint mobs/stretching   Left Knee Flexion 127   Strength   Strength Assessment Site Knee;Hip   Right/Left Hip Left   Left Hip Flexion 4/5   Left Hip Extension 4/5   Left Hip ABduction 4+/5   Left Hip ADduction 4/5   Right/Left Knee Left   Left Knee Flexion 4+/5   Left Knee Extension 4-/5                     [facility] Adult PT Treatment/Exercise - 06/09/15 1531    Ambulation/Gait   Stairs Yes   Stairs Assistance 6: Modified independent (Device/Increase time)  right railing  Stair Management Technique One rail Right;Alternating pattern   Number of Stairs 12   Height of Stairs 7   Exercises   Exercises Knee/Hip   Knee/Hip Exercises: Stretches   Passive Hamstring Stretch Left;20 seconds;3 reps   Passive Hamstring Stretch Limitations manually    Knee/Hip Exercises: Aerobic   Recumbent Bike lvl 2 x6"   Knee/Hip Exercises: Standing   Functional Squat 20 reps   Functional Squat Limitations TRX   Modalities   Modalities Vasopneumatic   Vasopneumatic   Number Minutes Vasopneumatic  6 minutes  set for 10 min, but patient requested to stop early   Vasopnuematic Location  Knee   Vasopneumatic Pressure Medium   Vasopneumatic Temperature  Lowest   Manual Therapy   Manual Therapy Joint mobilization;Passive ROM;Soft tissue mobilization   Joint Mobilization Grade II-III knee extension mobs with knee straight, patellar mobs   Soft tissue mobilization STM to left ITB in Rt sidelying   Passive ROM Into knee  extension                     PT Long Term Goals - 06/09/15 1614    PT LONG TERM GOAL #1   Title Independent with HEP (06/30/15)   Status Achieved   PT LONG TERM GOAL #2   Title Left knee ROM 3-120 or greater to allow normal motion with gait and stair climbing (06/30/15)   Status Partially Met  Met for flexion ROM, ongoing for extension ROM (AROM 10-124, PROM 4-127)   PT LONG TERM GOAL #3   Title Left knee strength 4/5 or greater for improved stability (06/30/15)   Status Partially Met  Met for hip muscles and hamstrings, ongoing for quads)   PT LONG TERM GOAL #4   Title Ambulate on all surfaces without cane with normal gait pattern (06/30/15)   Status Achieved   PT LONG TERM GOAL #5   Title Patient will ascend/descend stairs with or without railing demonstrating reciprocal pattern (06/30/15)   Status Achieved               Plan - 06/09/15 1626    Clinical Impression Statement Excellent progression with left knee flexion ROM, but continues to lack full extension although slowly improving with reducing edema. Left hip strength 4/5 or greater, with left knee flexion 4+/5 and extension 4-/5. Patient demonstrating good gait pattern without assistive device and able to ascend/descend stairs reciprocally with good step pattern. Upon descending the stairs during assessment, patient caught his left heel jarring his left leg resulting in strain to left ITB, therefore portion of visit spent on STM to left ITB to release muscle tension. Overall progressing well with plan for further therapy to focus on extension ROM and quad strengthening.   PT Next Visit Plan TKR protocol - bike, stretching with emphasis on extension ROM, hip/knee strengthening with emphasis on quad strength, patellar mobs, scar mobilization, add extra pillowcase under vasopneumatic sleeve, FOTO   Consulted and Agree with Plan of Care Patient        Problem List Patient Active Problem List   Diagnosis Date  Noted  . Primary osteoarthritis of left knee 04/30/2015  . Left knee DJD 04/30/2015  . Pneumonia 11/02/2014  . Hypertension   . Diabetes mellitus   . Hyperlipidemia   . OTHER AND UNSPECIFIED COAGULATION DEFECTS 09/28/2010  . ATRIAL FIBRILLATION 09/28/2010  . PERSONAL HX COLONIC POLYPS 09/28/2010    Percival Spanish, PT, MPT 06/09/2015, 6:23 PM  [facility] [address]  [place], [place], [address] Phone: [phone]   Fax:  [phone]

## 2015-06-12 ENCOUNTER — Ambulatory Visit: Payer: 59 | Admitting: Physical Therapy

## 2015-06-17 ENCOUNTER — Ambulatory Visit: Payer: 59 | Admitting: Physical Therapy

## 2015-06-17 DIAGNOSIS — R262 Difficulty in walking, not elsewhere classified: Secondary | ICD-10-CM

## 2015-06-17 DIAGNOSIS — R29898 Other symptoms and signs involving the musculoskeletal system: Secondary | ICD-10-CM

## 2015-06-17 DIAGNOSIS — M25562 Pain in left knee: Secondary | ICD-10-CM

## 2015-06-17 DIAGNOSIS — M25662 Stiffness of left knee, not elsewhere classified: Secondary | ICD-10-CM

## 2015-06-17 NOTE — Therapy (Signed)
[facility] [address]  [place], [place], [address] Phone: [phone]   Fax:  [phone]  Physical Therapy Treatment  Patient Details  Name: Dan [name] MRN: [id number] Date of Birth: 03-25-1951 Referring Provider:  Susa Day, MD  Encounter Date: 06/17/2015      PT End of Session - 06/17/15 0855    Visit Number 10   Number of Visits 14   Date for PT Re-Evaluation 06/30/15   PT Start Time 0848   PT Stop Time 0941   PT Time Calculation (min) 53 min   Activity Tolerance Patient tolerated treatment well   Behavior During Therapy [facility] for tasks assessed/performed      Past Medical History  Diagnosis Date  . Chronic atrial fibrillation   . Hypertension   . Hyperlipidemia   . Morbid obesity   . BPH (benign prostatic hypertrophy)   . Diabetes mellitus   . Sleep apnea     CPAP nightly  . Colon polyps     adenomatous  . Diverticulosis   . Hemorrhoids   . Dysrhythmia     tx. Pradaxa- Dr. Martinique follows  . GERD (gastroesophageal reflux disease)   . Arthritis     osteoarthritis left knee    Past Surgical History  Procedure Laterality Date  . US echocardiography  08/08/2006    ef 55-60%  . Knee arthroscopy Left 03/28/2014    Procedure: LEFT KNEE ARTHROSCOPY WITH DEBRIDEMENT  ;  Surgeon: Johnn Hai, MD;  Location: [facility];  Service: Orthopedics;  Laterality: Left;  . Tonsillectomy    . 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  Visit Diagnosis:  Left knee pain  Stiffness of knee joint, left  Difficulty walking  Weakness of left leg      Subjective Assessment - 06/17/15 0851    Subjective Patient states MD pleased with progress and we just need to work on swelling. States MD has ordered a  thigh-high compression stocking and patient will go to be measured for fit today. Patient reports "giving my knee a good trial over the past 3 days with [employer] ([employer])", walking on concrete floors for 8-10 hours per day.   Currently in Pain? Yes   Pain Score 3    Pain Location Knee   Pain Orientation Left            [facility] PT Assessment - 06/17/15 0848    Assessment   Next MD Visit 07/09/15   Observation/Other Assessments   Focus on Therapeutic Outcomes (FOTO)  60% (40% limitation)   ROM / Strength   AROM / PROM / Strength AROM   AROM   AROM Assessment Site Knee   Right/Left Knee Left   Left Knee Extension 14   Left Knee Flexion 125   PROM   PROM Assessment Site Knee   Right/Left Knee Left   Left Knee Extension 5   Left Knee Flexion 125                     [facility] Adult PT Treatment/Exercise - 06/17/15 0848    Exercises   Exercises Knee/Hip   Knee/Hip Exercises: Stretches   Passive Hamstring Stretch Left;20 seconds;3 reps   Passive Hamstring Stretch Limitations manually  ITB Stretch Left;20 seconds;3 reps   ITB Stretch Limitations manually   Knee/Hip Exercises: Aerobic   Recumbent Bike lvl 2 x6"   Knee/Hip Exercises: Machines for Strengthening   Cybex Knee Extension DL 20# 2x10   Cybex Knee Flexion DL 20# x10, 25# x10   Cybex Leg Press DL 25# x10, 35# x10   Knee/Hip Exercises: Standing   Terminal Knee Extension Strengthening;Left;20 reps   Terminal Knee Extension Limitations 5" hold with small ball behind knee   Lateral Step Up Left;10 reps;2 sets;Hand Hold: 2   Lateral Step Up Limitations BOSU with HHA on counter   Forward Step Up Left;10 reps;2 sets;Hand Hold: 2   Forward Step Up Limitations BOSU with HHA on counter   Step Down Left;2 sets;10 reps;Step Height: 6";Hand Hold: 1   Step Down Limitations Eccentric reach downs, 1 pole A   Functional Squat 20 reps   Functional Squat Limitations TRX   Other Standing Knee Exercises TM  hamstring pull x10   Other Standing Knee Exercises TM TKE x10   Modalities   Modalities Vasopneumatic   Vasopneumatic   Number Minutes Vasopneumatic  10 minutes   Vasopnuematic Location  Knee   Vasopneumatic Pressure Medium   Vasopneumatic Temperature  Lowest   Manual Therapy   Manual Therapy Joint mobilization;Passive ROM   Joint Mobilization Grade II-III knee extension mobs with knee straight, patellar mobs   Passive ROM Into knee extension to patient tolerance                     PT Long Term Goals - 06/17/15 8676    PT LONG TERM GOAL #1   Title Independent with HEP (06/30/15)   Status Achieved   PT LONG TERM GOAL #2   Title Left knee ROM 3-120 or greater to allow normal motion with gait and stair climbing (06/30/15)   Status Partially Met   PT LONG TERM GOAL #3   Title Left knee strength 4/5 or greater for improved stability (06/30/15)   Status Partially Met   PT LONG TERM GOAL #4   Title Ambulate on all surfaces without cane with normal gait pattern (06/30/15)   Status Achieved   PT LONG TERM GOAL #5   Title Patient will ascend/descend stairs with or without railing demonstrating reciprocal pattern (06/30/15)   Status Achieved               Plan - 06/17/15 0932    Clinical Impression Statement Increased edema noted today with patient having been on feet more, especially walking on concrete floors over past 3 days. Edema impacting ROM primarily in extension AROM. Encouraged patient to get compression stocking as soon as possible since he will be on his feet more while preparing for the [employer] and to continue with ice and elevation whenever possible to minimize edea.   PT Next Visit Plan TKR protocol - bike, stretching with emphasis on extension ROM, hip/knee strengthening with emphasis on quad strength, patellar mobs, scar mobilization, add extra pillowcase under vasopneumatic sleeve, FOTO   Consulted and Agree with Plan of Care Patient         Problem List Patient Active Problem List   Diagnosis Date Noted  . Primary osteoarthritis of left knee 04/30/2015  . Left knee DJD 04/30/2015  . Pneumonia 11/02/2014  . Hypertension   . Diabetes mellitus   . Hyperlipidemia   . OTHER AND UNSPECIFIED COAGULATION DEFECTS 09/28/2010  . ATRIAL FIBRILLATION 09/28/2010  . PERSONAL  HX COLONIC POLYPS 09/28/2010    Percival Spanish, PT, MPT 06/17/2015, 9:43 AM  [facility] [address]  [place], [place], [address] Phone: [phone]   Fax:  [phone]

## 2015-06-19 ENCOUNTER — Ambulatory Visit: Payer: 59 | Admitting: Physical Therapy

## 2015-06-19 DIAGNOSIS — M25562 Pain in left knee: Secondary | ICD-10-CM

## 2015-06-19 DIAGNOSIS — M25662 Stiffness of left knee, not elsewhere classified: Secondary | ICD-10-CM

## 2015-06-19 DIAGNOSIS — R262 Difficulty in walking, not elsewhere classified: Secondary | ICD-10-CM

## 2015-06-19 DIAGNOSIS — R29898 Other symptoms and signs involving the musculoskeletal system: Secondary | ICD-10-CM

## 2015-06-19 NOTE — Therapy (Signed)
[facility] [address]  [place], [place], [address] Phone: [phone]   Fax:  [phone]  Physical Therapy Treatment  Patient Details  Name: Dan [name] MRN: [id number] Date of Birth: 01/10/1951 Referring Provider:  Susa Day, MD  Encounter Date: 06/19/2015      PT End of Session - 06/19/15 0948    Visit Number 11   Number of Visits 14   Date for PT Re-Evaluation 06/30/15   PT Start Time 0934   PT Stop Time 1017   PT Time Calculation (min) 43 min   Activity Tolerance Patient tolerated treatment well   Behavior During Therapy [facility] for tasks assessed/performed      Past Medical History  Diagnosis Date  . Chronic atrial fibrillation   . Hypertension   . Hyperlipidemia   . Morbid obesity   . BPH (benign prostatic hypertrophy)   . Diabetes mellitus   . Sleep apnea     CPAP nightly  . Colon polyps     adenomatous  . Diverticulosis   . Hemorrhoids   . Dysrhythmia     tx. Pradaxa- Dr. Martinique follows  . GERD (gastroesophageal reflux disease)   . Arthritis     osteoarthritis left knee    Past Surgical History  Procedure Laterality Date  . US echocardiography  08/08/2006    ef 55-60%  . Knee arthroscopy Left 03/28/2014    Procedure: LEFT KNEE ARTHROSCOPY WITH DEBRIDEMENT  ;  Surgeon: Johnn Hai, MD;  Location[facility];  Service: Orthopedics;  Laterality: Left;  . Tonsillectomy    . 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  Visit Diagnosis:  Left knee pain  Stiffness of knee joint, left  Difficulty walking  Weakness of left leg      Subjective Assessment - 06/19/15 0939    Subjective Patient obtained compression stocking and is wearing it during the day but taking it off in the  evening when he gets home. Has noted some improvment in the swelling but not much difference in the pain.   Currently in Pain? Yes   Pain Score 3             [facility] PT Assessment - 06/19/15 0934    ROM / Strength   AROM / PROM / Strength AROM;PROM;Strength   AROM   AROM Assessment Site Knee   Right/Left Knee Left   Left Knee Extension 9   Left Knee Flexion 125   PROM   PROM Assessment Site Knee   Right/Left Knee Left   Left Knee Extension 5   Left Knee Flexion 126   Strength   Strength Assessment Site Knee;Hip   Right/Left Hip Left   Left Hip Flexion 4+/5   Left Hip Extension 4/5   Left Hip ABduction 4+/5   Left Hip ADduction 4+/5   Right/Left Knee Left   Left Knee Flexion 4+/5   Left Knee Extension 4/5                     [facility] Adult PT Treatment/Exercise - 06/19/15 0934    Exercises   Exercises Knee/Hip   Knee/Hip Exercises: Aerobic   Recumbent Bike lvl 2 x6"   Knee/Hip Exercises: Machines for Strengthening   Cybex  Knee Extension DL 20# x10, Lt SL 10# x10   Cybex Knee Flexion DL 25# x10, Lt SL 15# x10   Cybex Leg Press DL 35# x10   Knee/Hip Exercises: Standing   Lateral Step Up Left;10 reps;2 sets;Hand Hold: 2   Lateral Step Up Limitations BOSU with HHA on counter   Forward Step Up Left;10 reps;2 sets;Hand Hold: 2   Forward Step Up Limitations BOSU with HHA on counter   Step Down Left;2 sets;10 reps;Step Height: 8";Step Height: 6";Hand Hold: 2   Step Down Limitations Eccentric reach downs, 2 pole A; 1st set on 8" step but poor control so switched to 6" step for 2nd set   Functional Squat 20 reps   Functional Squat Limitations TRX   Other Standing Knee Exercises TM hamstring pull x15, TKE x15   Other Standing Knee Exercises Fitter (1 black/1 blue)                      PT Long Term Goals - 06/17/15 9937    PT LONG TERM GOAL #1   Title Independent with HEP (06/30/15)   Status Achieved   PT LONG TERM GOAL #2   Title Left knee ROM  3-120 or greater to allow normal motion with gait and stair climbing (06/30/15)   Status Partially Met   PT LONG TERM GOAL #3   Title Left knee strength 4/5 or greater for improved stability (06/30/15)   Status Partially Met   PT LONG TERM GOAL #4   Title Ambulate on all surfaces without cane with normal gait pattern (06/30/15)   Status Achieved   PT LONG TERM GOAL #5   Title Patient will ascend/descend stairs with or without railing demonstrating reciprocal pattern (06/30/15)   Status Achieved               Plan - 06/19/15 1038    Clinical Impression Statement Edema improving with use of compresssion stocking but limited change in ROM since last visit. Strength improved in left hip and knee but still limited control with TKE. Will continue tocus on increasing extension ROM and TKE.   PT Next Visit Plan TKR protocol - bike, stretching with emphasis on extension ROM, hip/knee strengthening with emphasis on quad strength, patellar mobs, scar mobilization, add extra pillowcase under vasopneumatic sleeve   Consulted and Agree with Plan of Care Patient        Problem List Patient Active Problem List   Diagnosis Date Noted  . Primary osteoarthritis of left knee 04/30/2015  . Left knee DJD 04/30/2015  . Pneumonia 11/02/2014  . Hypertension   . Diabetes mellitus   . Hyperlipidemia   . OTHER AND UNSPECIFIED COAGULATION DEFECTS 09/28/2010  . ATRIAL FIBRILLATION 09/28/2010  . PERSONAL HX COLONIC POLYPS 09/28/2010    Percival Spanish, PT, MPT 06/19/2015, 10:44 AM  [facility] [address]  [place], [place], [address] Phone: [phone]   Fax:  [phone]

## 2015-06-22 ENCOUNTER — Ambulatory Visit: Payer: 59 | Admitting: Physical Therapy

## 2015-06-22 DIAGNOSIS — M25562 Pain in left knee: Secondary | ICD-10-CM | POA: Diagnosis not present

## 2015-06-22 DIAGNOSIS — M25662 Stiffness of left knee, not elsewhere classified: Secondary | ICD-10-CM

## 2015-06-22 DIAGNOSIS — R29898 Other symptoms and signs involving the musculoskeletal system: Secondary | ICD-10-CM

## 2015-06-22 NOTE — Therapy (Signed)
[facility] [address]  [place], [place], [address] Phone: [phone]   Fax:  [phone]  Physical Therapy Treatment  Patient Details  Name: Dan [name] MRN: [id number] Date of Birth: January 13, 1951 Referring Provider:  Susa Day, MD  Encounter Date: 06/22/2015      PT End of Session - 06/22/15 1629    Visit Number 12   Number of Visits 14   Date for PT Re-Evaluation 06/30/15   PT Start Time 1616   PT Stop Time 1701   PT Time Calculation (min) 45 min   Activity Tolerance Patient tolerated treatment well   Behavior During Therapy [facility] for tasks assessed/performed      Past Medical History  Diagnosis Date  . Chronic atrial fibrillation   . Hypertension   . Hyperlipidemia   . Morbid obesity   . BPH (benign prostatic hypertrophy)   . Diabetes mellitus   . Sleep apnea     CPAP nightly  . Colon polyps     adenomatous  . Diverticulosis   . Hemorrhoids   . Dysrhythmia     tx. Pradaxa- Dr. Martinique follows  . GERD (gastroesophageal reflux disease)   . Arthritis     osteoarthritis left knee    Past Surgical History  Procedure Laterality Date  . US echocardiography  08/08/2006    ef 55-60%  . Knee arthroscopy Left 03/28/2014    Procedure: LEFT KNEE ARTHROSCOPY WITH DEBRIDEMENT  ;  Surgeon: Johnn Hai, MD;  Location: [facility];  Service: Orthopedics;  Laterality: Left;  . Tonsillectomy    . 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  Visit Diagnosis:  Left knee pain  Stiffness of knee joint, left  Weakness of left leg      Subjective Assessment - 06/22/15 1628    Subjective Patient noting improvement in swelling with compression stocking but impatient that swelling not fully resolved.   Currently in Pain? Yes   Pain Score 3    Pain Location Knee   Pain Orientation Left                         [facility] Adult PT Treatment/Exercise - 06/22/15 1616    Exercises   Exercises Knee/Hip   Knee/Hip Exercises: Aerobic   Recumbent Bike lvl 3 x6"   Knee/Hip Exercises: Machines for Strengthening   Cybex Knee Extension DL 25# x10, Lt SL 15# x10   Cybex Knee Flexion DL 35# x10, Lt SL 15# x10   Cybex Leg Press DL 35# x10   Knee/Hip Exercises: Standing   Lateral Step Up Left;10 reps;2 sets;Hand Hold: 2   Lateral Step Up Limitations BOSU with HHA on counter   Forward Step Up Left;10 reps;2 sets;Hand Hold: 2   Forward Step Up Limitations BOSU with HHA on counter   Step Down Left;2 sets;10 reps;Step Height: 6";Hand Hold: 2   Step Down Limitations Eccentric reach downs, 2 pole A; 1st set with toe touch, 2nd set with heel touch   Functional Squat 20 reps   Functional Squat Limitations TRX   Other Standing Knee Exercises Fitter (1 black/1 blue) hip extension 2x10   Manual Therapy   Manual Therapy Joint mobilization;Passive ROM   Joint Mobilization Grade  II-III knee extension mobs with knee straight, patellar mobs   Passive ROM Into knee extension to patient tolerance                     PT Long Term Goals - 06/22/15 1910    PT LONG TERM GOAL #1   Title Independent with HEP (06/30/15)   Status Achieved   PT LONG TERM GOAL #2   Title Left knee ROM 3-120 or greater to allow normal motion with gait and stair climbing (06/30/15)   Status Partially Met   PT LONG TERM GOAL #3   Title Left knee strength 4/5 or greater for improved stability (06/30/15)   Status Partially Met   PT LONG TERM GOAL #4   Title Ambulate on all surfaces without cane with normal gait pattern (06/30/15)   Status Achieved               Plan - 06/22/15 1905    Clinical Impression Statement Patient tolerating progression of therapeutic exercises without complaint. Edema continuing  to improve but left knee extension ROM and control still lacking.         Problem List Patient Active Problem List   Diagnosis Date Noted  . Primary osteoarthritis of left knee 04/30/2015  . Left knee DJD 04/30/2015  . Pneumonia 11/02/2014  . Hypertension   . Diabetes mellitus   . Hyperlipidemia   . OTHER AND UNSPECIFIED COAGULATION DEFECTS 09/28/2010  . ATRIAL FIBRILLATION 09/28/2010  . PERSONAL HX COLONIC POLYPS 09/28/2010    Percival Spanish, PT, MPT 06/22/2015, 7:11 PM  [facility] [address]  [place], [place], [address] Phone: [phone]   Fax:  [phone]

## 2015-06-24 ENCOUNTER — Ambulatory Visit: Payer: 59 | Admitting: Physical Therapy

## 2015-06-24 DIAGNOSIS — M25562 Pain in left knee: Secondary | ICD-10-CM | POA: Diagnosis not present

## 2015-06-24 DIAGNOSIS — M25662 Stiffness of left knee, not elsewhere classified: Secondary | ICD-10-CM

## 2015-06-24 DIAGNOSIS — R29898 Other symptoms and signs involving the musculoskeletal system: Secondary | ICD-10-CM

## 2015-06-24 NOTE — Therapy (Signed)
[facility] [address]  [place], [place], [address] Phone: [phone]   Fax:  [phone]  Physical Therapy Treatment  Patient Details  Name: Dan [name] MRN: [id number] Date of Birth: 1950/11/18 Referring Provider:  Susa Day, MD  Encounter Date: 06/24/2015      PT End of Session - 06/24/15 0938    Visit Number 13   Number of Visits 14   Date for PT Re-Evaluation 06/30/15   PT Start Time 0934   PT Stop Time 1012   PT Time Calculation (min) 38 min   Activity Tolerance Patient tolerated treatment well   Behavior During Therapy [facility] for tasks assessed/performed      Past Medical History  Diagnosis Date  . Chronic atrial fibrillation   . Hypertension   . Hyperlipidemia   . Morbid obesity   . BPH (benign prostatic hypertrophy)   . Diabetes mellitus   . Sleep apnea     CPAP nightly  . Colon polyps     adenomatous  . Diverticulosis   . Hemorrhoids   . Dysrhythmia     tx. Pradaxa- Dr. Martinique follows  . GERD (gastroesophageal reflux disease)   . Arthritis     osteoarthritis left knee    Past Surgical History  Procedure Laterality Date  . US echocardiography  08/08/2006    ef 55-60%  . Knee arthroscopy Left 03/28/2014    Procedure: LEFT KNEE ARTHROSCOPY WITH DEBRIDEMENT  ;  Surgeon: Johnn Hai, MD;  Location: [facility];  Service: Orthopedics;  Laterality: Left;  . Tonsillectomy    . 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  Visit Diagnosis:  Left knee pain  Stiffness of knee joint, left  Weakness of left leg      Subjective Assessment - 06/24/15 0937    Subjective Patient reporting pain normally about a 3/10. Noting some clicking.   Currently in Pain? Yes   Pain Score 3    Pain  Location Knee   Pain Orientation Left            [facility] PT Assessment - 06/24/15 0934    ROM / Strength   AROM / PROM / Strength AROM   AROM   AROM Assessment Site Knee   Right/Left Knee Left   Left Knee Extension 8   Left Knee Flexion 126   PROM   PROM Assessment Site Knee   Right/Left Knee Left   Left Knee Extension 3  after PROM/joint mobs/stretching   Left Knee Flexion 127                     [facility] Adult PT Treatment/Exercise - 06/24/15 0934    Exercises   Exercises Knee/Hip   Knee/Hip Exercises: Aerobic   Recumbent Bike lvl 3 x6"   Knee/Hip Exercises: Standing   Lateral Step Up Left;15 reps;Hand Hold: 2   Lateral Step Up Limitations BOSU, 2 pole A   Forward Step Up Left;15 reps;Hand Hold: 2   Forward Step Up Limitations BOSU with HHA on counter  BOSU, 2 pole A   Step Down Left;2 sets;10 reps;Hand Hold: 2;Step Height: 8"   Step Down Limitations Eccentric reach downs, 2 pole A   Functional Squat 20 reps   Functional  Squat Limitations TRX with heel raise/knee extension upon return to upright   Other Standing Knee Exercises TM hamstring pull x15, TKE x15   Other Standing Knee Exercises Fitter (1 black/1 blue) Lt hip/knee extension 2x15, Lt hip abduction x15   Manual Therapy   Manual Therapy Joint mobilization;Passive ROM   Joint Mobilization Grade II-III knee extension mobs with knee straight, patellar mobs   Passive ROM Into knee extension to patient tolerancewith contract/relax                     PT Long Term Goals - 06/22/15 1910    PT LONG TERM GOAL #1   Title Independent with HEP (06/30/15)   Status Achieved   PT LONG TERM GOAL #2   Title Left knee ROM 3-120 or greater to allow normal motion with gait and stair climbing (06/30/15)   Status Partially Met   PT LONG TERM GOAL #3   Title Left knee strength 4/5 or greater for improved stability (06/30/15)   Status Partially Met   PT LONG TERM GOAL #4   Title Ambulate on all surfaces  without cane with normal gait pattern (06/30/15)   Status Achieved               Plan - 06/24/15 1013    Clinical Impression Statement Left knee extension continues to respond slowly to stretching and joint mobilization with patient continuing to demonstrate tendency to allow leg to ER and flex at knee when resting out straight contributing to continued tightness in knee. Reinforced keeping toes straight up with knee extension to discourage flexion contracture, along with reinforcement of hamstring stretching. Goals mostly met with exception of left knee extension ROM, therefore anticipate patient should be able to transition to home program at next visit, but will review goals and determine readiness for discharge vs recert at net visit.   PT Next Visit Plan D/C vs recert   Consulted and Agree with Plan of Care Patient        Problem List Patient Active Problem List   Diagnosis Date Noted  . Primary osteoarthritis of left knee 04/30/2015  . Left knee DJD 04/30/2015  . Pneumonia 11/02/2014  . Hypertension   . Diabetes mellitus   . Hyperlipidemia   . OTHER AND UNSPECIFIED COAGULATION DEFECTS 09/28/2010  . ATRIAL FIBRILLATION 09/28/2010  . PERSONAL HX COLONIC POLYPS 09/28/2010    Percival Spanish, PT, MPT 06/24/2015, 10:24 AM  [facility] [place], [place], [address] Phone: [phone]   Fax:  [phone]

## 2015-06-29 ENCOUNTER — Ambulatory Visit: Payer: 59 | Admitting: Physical Therapy

## 2015-06-29 DIAGNOSIS — R29898 Other symptoms and signs involving the musculoskeletal system: Secondary | ICD-10-CM

## 2015-06-29 DIAGNOSIS — M25662 Stiffness of left knee, not elsewhere classified: Secondary | ICD-10-CM

## 2015-06-29 DIAGNOSIS — M25562 Pain in left knee: Secondary | ICD-10-CM

## 2015-06-29 NOTE — Therapy (Addendum)
[facility] [address]  [place], [place], [address] Phone: [phone]   Fax:  [phone]  Physical Therapy Treatment  Patient Details  Name: Dan [name] MRN: [id number] Date of Birth: 1950/12/12 Referring Provider:  Susa Day, MD  Encounter Date: 06/29/2015      PT End of Session - 06/29/15 0936    Visit Number 14   Number of Visits 14   Date for PT Re-Evaluation 06/30/15   PT Start Time 0933   PT Stop Time 1012   PT Time Calculation (min) 39 min   Activity Tolerance Patient tolerated treatment well   Behavior During Therapy [facility] for tasks assessed/performed      Past Medical History  Diagnosis Date  . Chronic atrial fibrillation   . Hypertension   . Hyperlipidemia   . Morbid obesity   . BPH (benign prostatic hypertrophy)   . Diabetes mellitus   . Sleep apnea     CPAP nightly  . Colon polyps     adenomatous  . Diverticulosis   . Hemorrhoids   . Dysrhythmia     tx. Pradaxa- Dr. Martinique follows  . GERD (gastroesophageal reflux disease)   . Arthritis     osteoarthritis left knee    Past Surgical History  Procedure Laterality Date  . US echocardiography  08/08/2006    ef 55-60%  . Knee arthroscopy Left 03/28/2014    Procedure: LEFT KNEE ARTHROSCOPY WITH DEBRIDEMENT  ;  Surgeon: Johnn Hai, MD;  Location: [facility];  Service: Orthopedics;  Laterality: Left;  . Tonsillectomy    . 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  Visit Diagnosis:  Left knee pain  Stiffness of knee joint, left  Weakness of left leg      Subjective Assessment - 06/29/15 0935    Subjective Patient not wearing compression stocking today secondary still drying from handwashing. Feels like swelling is much better  since wearing stocking.   Currently in Pain? Yes   Pain Score 2    Pain Location Knee   Pain Orientation Left            [facility] PT Assessment - 06/29/15 0933    Observation/Other Assessments   Focus on Therapeutic Outcomes (FOTO)  68% (32% limitation)   ROM / Strength   AROM / PROM / Strength AROM;PROM;Strength   AROM   AROM Assessment Site Knee   Right/Left Knee Left   Left Knee Extension 5   Left Knee Flexion 126   PROM   PROM Assessment Site Knee   Right/Left Knee Left   Left Knee Extension 3  after PROM/joint mobs/stretching   Left Knee Flexion 127   Strength   Strength Assessment Site Knee;Hip   Right/Left Hip Left   Left Hip Flexion 4+/5   Left Hip Extension 4/5   Left Hip ABduction 4+/5   Left Hip ADduction 4+/5   Right/Left Knee Left   Left Knee Flexion 4+/5   Left Knee Extension 4+/5                 [facility] Adult PT Treatment/Exercise - 06/29/15 0933    Exercises   Exercises Knee/Hip   Knee/Hip Exercises: Aerobic   Recumbent Bike lvl 3 x6"   Knee/Hip Exercises:  Standing   Hip Flexion Stengthening;Both;10 reps;Knee straight   Hip Flexion Limitations [place], HHA on back of chair   Hip Abduction Stengthening;Both;10 reps;Knee straight   Abduction Limitations [place], HHA on back of chair   Hip Extension Both;10 reps;Knee straight   Extension Limitations [place] TB, HHA on back of chair   Lateral Step Up Left;15 reps;Hand Hold: 1;Step Height: 8"   Forward Step Up Left;15 reps;Hand Hold: 1   Step Down Left;2 sets;10 reps;Hand Hold: 2;Step Height: 8"   Step Down Limitations Eccentric reach downs, 2 pole A   Functional Squat 20 reps   Functional Squat Limitations HHA at edge of sink   Wall Squat 20 reps;3 seconds                PT Education - 06/29/15 1210    Education provided Yes   Education Details Updated HEP   Person(s) Educated Patient   Methods Explanation;Demonstration;Handout   Comprehension Verbalized understanding;Returned  demonstration             PT Long Term Goals - 06/29/15 0937    PT LONG TERM GOAL #1   Title Independent with HEP (06/30/15)   Status Achieved   PT LONG TERM GOAL #2   Title Left knee ROM 3-120 or greater to allow normal motion with gait and stair climbing (06/30/15)   Status Achieved  Achieved for PROM; AROM 5-126   PT LONG TERM GOAL #3   Title Left knee strength 4/5 or greater for improved stability (06/30/15)   Status Achieved   PT LONG TERM GOAL #4   Title Ambulate on all surfaces without cane with normal gait pattern (06/30/15)   Status Achieved   PT LONG TERM GOAL #5   Title Patient will ascend/descend stairs with or without railing demonstrating reciprocal pattern (06/30/15)   Status Achieved               Plan - 06/29/15 1013    Clinical Impression Statement Slight increase in swelling without compression stocking today, therefore encouraged patient to continue to wear stocking during the day especially during upcoming furniture market where he will be on his feet >10 hours per day. Left knee ROM WFL but still lacking end range TKE. Left hip and knee strength grossly 4+/5 and nearly symmetrical with right LE. Patient ambulating with normal gait pattern without AD and able to ascend/descend stairs reciprocally with one rail assist without difficulty. Patient has essentially met all therapy goals and is ready to transition to HEP. HEP updated to reflect relevant exercises to continue strengthening and TKE focus, with patient able to demonstrate all exercises appropriately. Patient requesting to remain on hold for PT until he sees the MD on 07/09/15.   PT Next Visit Plan Hold x 30 days with patient to call after MD appt on 07/09/15   Consulted and Agree with Plan of Care Patient        Problem List Patient Active Problem List   Diagnosis Date Noted  . Primary osteoarthritis of left knee 04/30/2015  . Left knee DJD 04/30/2015  . Pneumonia 11/02/2014  . Hypertension    . Diabetes mellitus   . Hyperlipidemia   . OTHER AND UNSPECIFIED COAGULATION DEFECTS 09/28/2010  . ATRIAL FIBRILLATION 09/28/2010  . PERSONAL HX COLONIC POLYPS 09/28/2010    Percival Spanish, PT, MPT 06/29/2015, 12:11 PM  [facility] [address]  [place], [place], [address] Phone: [phone]  Fax:  [phone]     PHYSICAL THERAPY DISCHARGE SUMMARY  Visits from Start of Care: 14  Current functional level related to goals / functional outcomes:  As of last PT visit, left knee ROM WFL but still lacking end range TKE. Left hip and knee strength grossly 4+/5 and nearly symmetrical with right LE. Patient ambulating with normal gait pattern without AD and able to ascend/descend stairs reciprocally with one rail assist without difficulty. Patient had essentially met all therapy goals and was ready to transition to HEP. HEP was updated to reflect relevant exercises to continue strengthening and TKE focus, with patient able to demonstrate all exercises appropriately. Patient was placed on hold for PT until he saw the MD on 07/09/15.No further appointments scheduled after MD appt, therefore will proceed with discharge from PT for this episode.   Remaining deficits:  TKE limited to 5 dg actively, 3 dg passively   Education / Equipment:  HEP  Plan: Patient agrees to discharge.  Patient goals were met. Patient is being discharged due to meeting the stated rehab goals.  ?????       Percival Spanish, PT, MPT 08/03/2015, 2:31 PM  [facility] [address]  [place], [place], [address] Phone: [phone]   Fax:  [phone]

## 2015-08-20 ENCOUNTER — Encounter: Payer: Self-pay | Admitting: Internal Medicine

## 2015-12-03 ENCOUNTER — Ambulatory Visit (INDEPENDENT_AMBULATORY_CARE_PROVIDER_SITE_OTHER): Payer: 59 | Admitting: Cardiology

## 2015-12-03 ENCOUNTER — Encounter: Payer: Self-pay | Admitting: Cardiology

## 2015-12-03 VITALS — BP 144/88 | HR 79 | Ht 73.0 in | Wt 265.0 lb

## 2015-12-03 DIAGNOSIS — I10 Essential (primary) hypertension: Secondary | ICD-10-CM

## 2015-12-03 DIAGNOSIS — I482 Chronic atrial fibrillation, unspecified: Secondary | ICD-10-CM

## 2015-12-03 DIAGNOSIS — E119 Type 2 diabetes mellitus without complications: Secondary | ICD-10-CM

## 2015-12-03 DIAGNOSIS — E785 Hyperlipidemia, unspecified: Secondary | ICD-10-CM

## 2015-12-03 NOTE — Patient Instructions (Signed)
Continue your current therapy  Consider reducing amlodipine if BP remains well controlled.  I will see you in one year

## 2015-12-03 NOTE — Progress Notes (Signed)
[name] Date of Birth: 1950-11-14   History of Present Illness Dan [name] is seen today for follow up of atrial fibrillation. He has a history of chronic atrial fibrillation that has been managed with rate control and anticoagulation.  He remains asymptomatic. He denies any chest pain, shortness of breath, or palpitations. He had left TKR last July without complication. He has been more active walking 3 miles 4 days a week. He is following a low carb diet and has lost 21 lbs. He feels much better. Blood sugars and BP have improved. He does note some swelling in his ankles.    He had a stress Myoview in August 2014 which was low risk with a small fixed apical defect. No ischemia.   Current Outpatient Prescriptions on File Prior to Visit  Medication Sig Dispense Refill  . acetaminophen (TYLENOL) 500 MG tablet Take 500-1,000 mg by mouth every 6 (six) hours as needed for mild pain.     Marland Kitchen albuterol (PROVENTIL HFA;VENTOLIN HFA) 108 (90 BASE) MCG/ACT inhaler Inhale 2 puffs into the lungs every 6 (six) hours as needed for wheezing or shortness of breath. 1 Inhaler 2  . amLODipine (NORVASC) 10 MG tablet Take 10 mg by mouth every morning.     Marland Kitchen BYSTOLIC 10 MG tablet Take 40 mg by mouth every morning.   2  . dabigatran (PRADAXA) 150 MG CAPS capsule Take 1 capsule (150 mg total) by mouth every 12 (twelve) hours. 180 capsule 0  . ezetimibe-simvastatin (VYTORIN) 10-20 MG per tablet Take 1 tablet by mouth every morning.     . fluticasone (FLONASE) 50 MCG/ACT nasal spray Place 2 sprays into both nostrils daily as needed.  11  . furosemide (LASIX) 40 MG tablet Take 1 tablet (40 mg total) by mouth daily. 30 tablet 5  . linagliptin (TRADJENTA) 5 MG TABS tablet Take 5 mg by mouth every morning.    Marland Kitchen lisinopril (PRINIVIL,ZESTRIL) 40 MG tablet Take 40 mg by mouth every morning.     . metFORMIN (GLUCOPHAGE) 1000 MG tablet Take 1 tablet by mouth 2 (two) times daily with a meal.     . omeprazole (PRILOSEC) 20  MG capsule Take 1 capsule (20 mg total) by mouth daily. 30 capsule 11  . oxyCODONE-acetaminophen (PERCOCET) 10-325 MG per tablet Take 1 tablet by mouth every 4 (four) hours as needed for pain. 60 tablet 0  . sitaGLIPtin (JANUVIA) 50 MG tablet Take 50 mg by mouth daily.     No current facility-administered medications on file prior to visit.    Allergies  Allergen Reactions  . Macadamia Nut Oil Shortness Of Breath    Past Medical History  Diagnosis Date  . Chronic atrial fibrillation ([place])   . Hypertension   . Hyperlipidemia   . Morbid obesity ([place])   . BPH (benign prostatic hypertrophy)   . Diabetes mellitus   . Sleep apnea     CPAP nightly  . Colon polyps     adenomatous  . Diverticulosis   . Hemorrhoids   . Dysrhythmia     tx. Pradaxa- Dr. Martinique follows  . GERD (gastroesophageal reflux disease)   . Arthritis     osteoarthritis left knee    Past Surgical History  Procedure Laterality Date  . US echocardiography  08/08/2006    ef 55-60%  . Knee arthroscopy Left 03/28/2014    Procedure: LEFT KNEE ARTHROSCOPY WITH DEBRIDEMENT  ;  Surgeon: Johnn Hai, MD;  Location: [facility];  Service: Orthopedics;  Laterality: Left;  . Tonsillectomy    . 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 Former Smoker  . Quit date: 06/20/1991  Smokeless tobacco  . Former Systems developer    History  Alcohol Use  . 0.0 oz/week  . 0 Standard drinks or equivalent per week    Comment: social    Family History  Problem Relation Age of Onset  . Heart attack Father   . Colon cancer Mother     Review of Systems: As per history of present illness.  All other systems were reviewed and are negative.  Physical Exam: BP 144/88 mmHg  Pulse 79  Ht 6' 1"  (1.854 m)  Wt 120.203 kg (265 lb)  BMI 34.97 kg/m2 He is an obese  white male in no distress.   The HEENT exam is normal.  The carotids are 2+ without bruits.  There is no thyromegaly.  There is no JVD.  The lungs are clear.    The heart exam reveals an irregular rate with a normal S1 and S2.  There are no murmurs, gallops, or rubs.  The PMI is not displaced.   Abdominal exam reveals good bowel sounds.    There are no masses.  Exam of the legs reveal 1+ edema.  The distal pulses are intact.  Cranial nerves II - XII are intact.  Motor and sensory functions are intact.  The gait is normal.  LABORATORY DATA: ECG today demonstrates atrial fibrillation with a controlled ventricular response of 79 beats per minute. It is otherwise normal. I have personally reviewed and interpreted this study.   Assessment / Plan: 1. Permanent atrial fibrillation. Rate is well controlled and he is anticoagulated with Pradaxa. He is asymptomatic. We will continue with his current therapy.  2. Low risk Myoview in 2014 without ischemia.  3. Hypertension-controlled  4. Diabetes mellitus type 2-controlled.  5. Hyperlipidemia on Vytorin.   6. Edema. Possibly related to high dose amlodipine. With improvement in BP may want to consider reducing his dose. He will discuss this with Dr. Reynaldo Minium.

## 2016-02-06 ENCOUNTER — Other Ambulatory Visit: Payer: Self-pay | Admitting: Internal Medicine

## 2016-09-08 ENCOUNTER — Other Ambulatory Visit: Payer: Self-pay | Admitting: Internal Medicine

## 2016-09-13 DIAGNOSIS — G4733 Obstructive sleep apnea (adult) (pediatric): Secondary | ICD-10-CM | POA: Insufficient documentation

## 2016-09-13 DIAGNOSIS — J310 Chronic rhinitis: Secondary | ICD-10-CM | POA: Insufficient documentation

## 2016-09-13 DIAGNOSIS — J0141 Acute recurrent pansinusitis: Secondary | ICD-10-CM | POA: Insufficient documentation

## 2016-10-09 IMAGING — CR DG KNEE 1-2V*L*
3 series · 3 of 3 positions shown · non-contrast
Comparison: None.

CLINICAL DATA: Osteoarthritis.

EXAM:
LEFT KNEE - 1-2 VIEW

[w knee ap left]
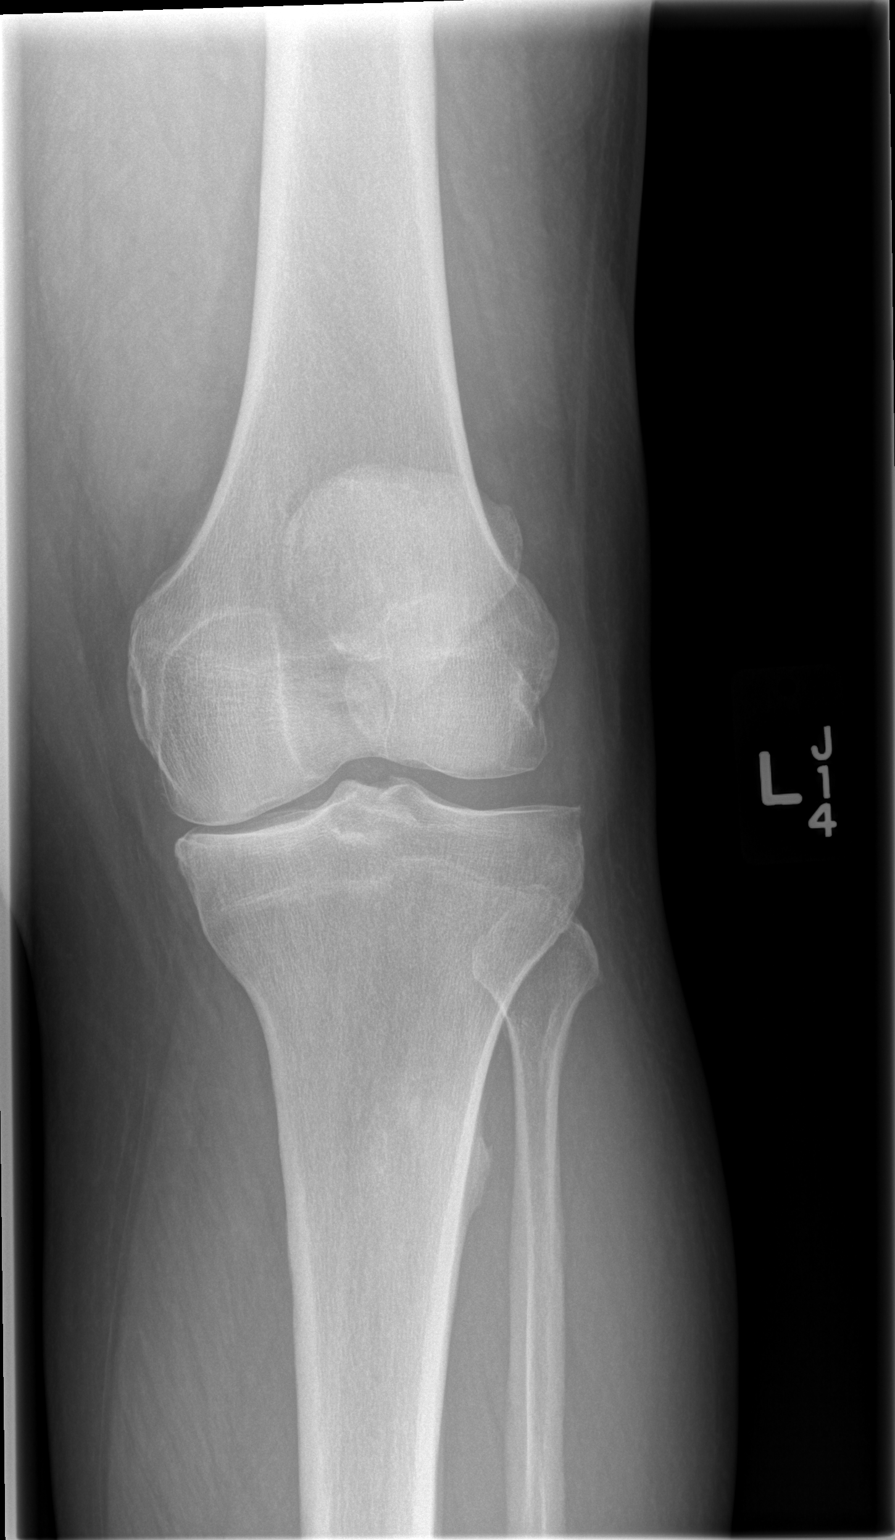

[w knee lat. left (1 of 2)]
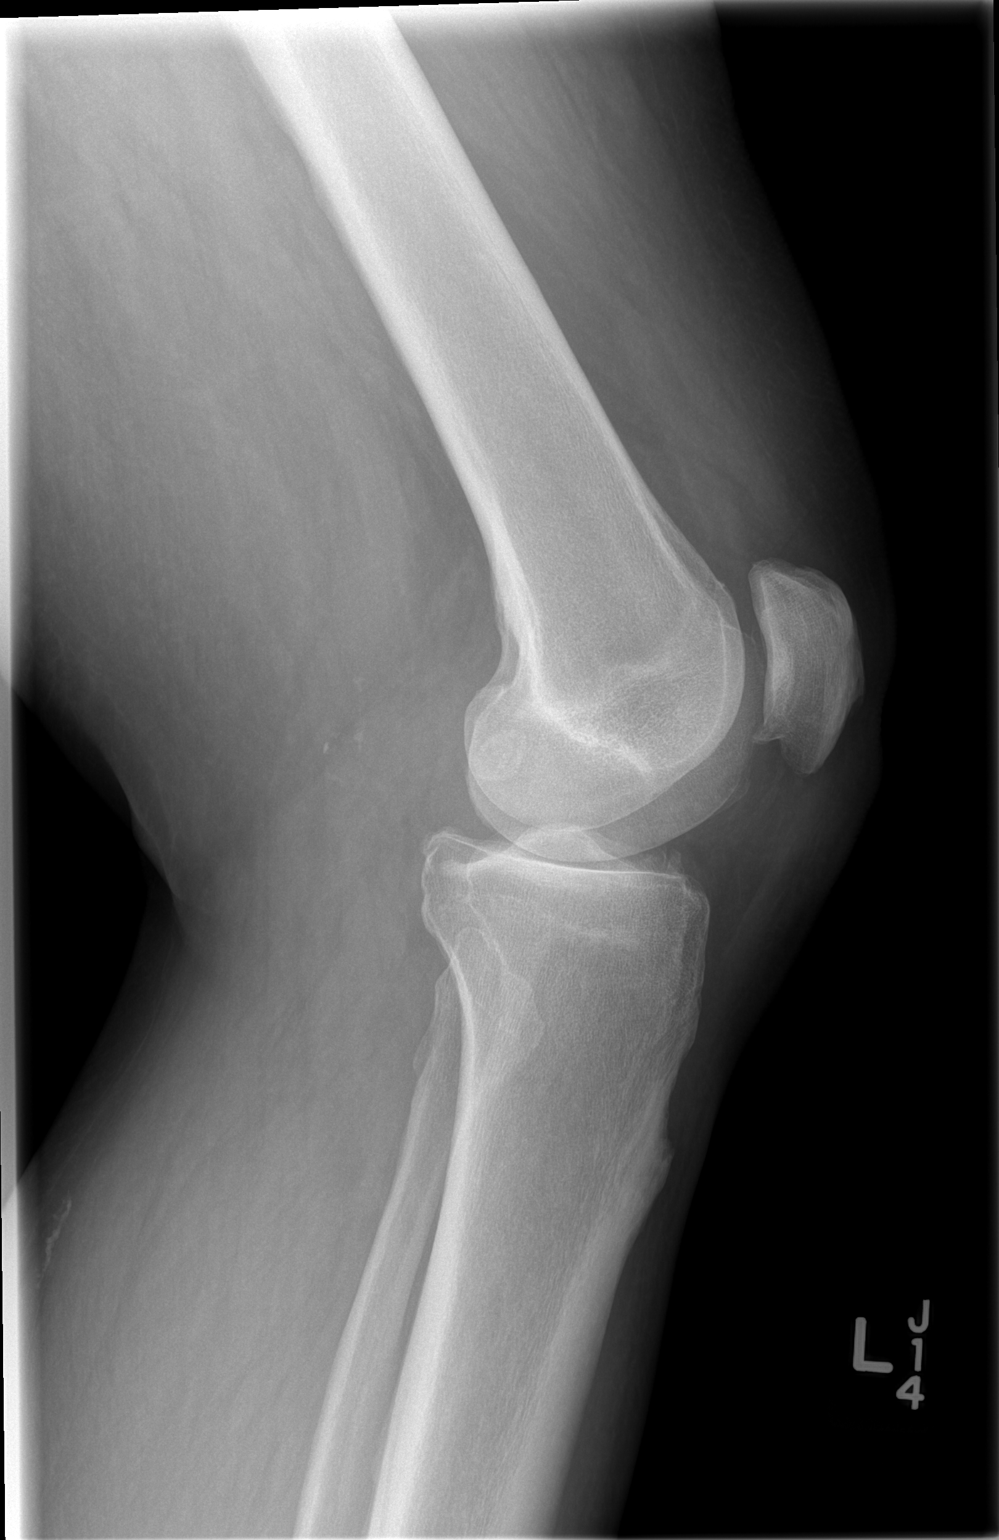

[w knee lat. left (2 of 2)]
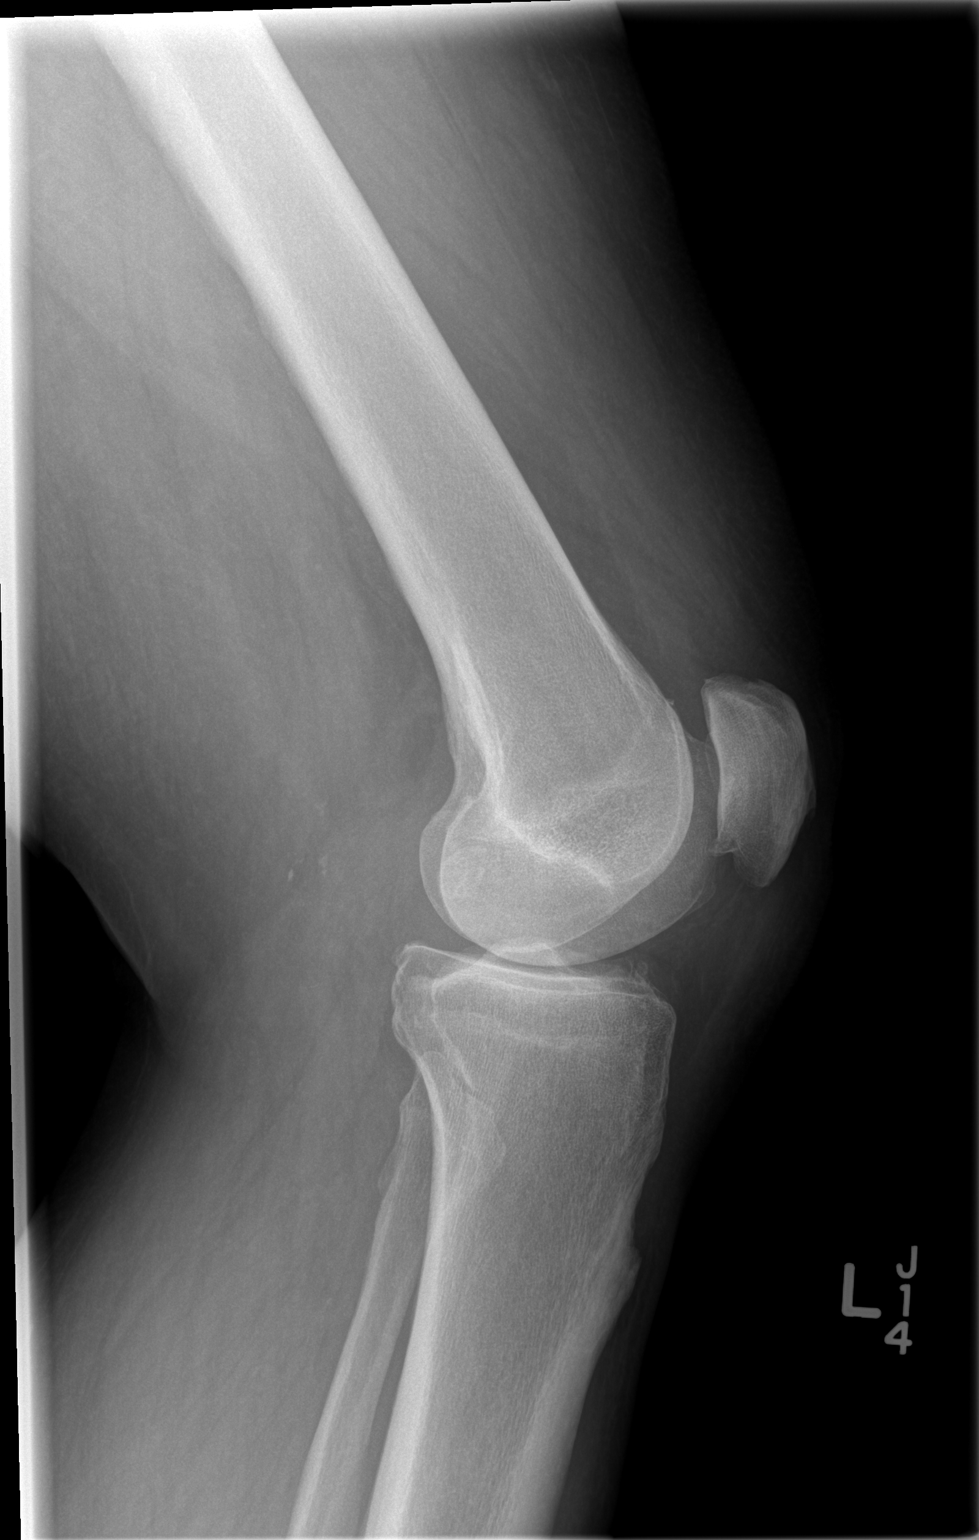

[3 of 3 positions shown; findings below may reference images not displayed]

FINDINGS: Tricompartment degenerative change. Degenerative changes most
prominent about the medial compartment. No acute abnormality.
Peripheral vascular calcification.
IMPRESSION: 1. Tricompartment degenerative change.  No acute bony abnormality.
2. Peripheral vascular disease.

## 2016-10-10 ENCOUNTER — Other Ambulatory Visit: Payer: Self-pay | Admitting: Internal Medicine

## 2016-11-07 ENCOUNTER — Telehealth: Payer: Self-pay | Admitting: Physician Assistant

## 2016-11-07 NOTE — Telephone Encounter (Signed)
Received records from [facility] for appointment on 11/09/16 with Almyra Deforest, PA.  Records put with Hao's schedule for 11/09/16.  lp

## 2016-11-08 ENCOUNTER — Other Ambulatory Visit: Payer: Self-pay | Admitting: Internal Medicine

## 2016-11-08 DIAGNOSIS — I10 Essential (primary) hypertension: Secondary | ICD-10-CM

## 2016-11-09 ENCOUNTER — Encounter: Payer: Self-pay | Admitting: Physician Assistant

## 2016-11-09 ENCOUNTER — Ambulatory Visit (INDEPENDENT_AMBULATORY_CARE_PROVIDER_SITE_OTHER): Payer: 59 | Admitting: Physician Assistant

## 2016-11-09 VITALS — BP 162/99 | HR 79 | Ht 73.0 in | Wt 281.0 lb

## 2016-11-09 DIAGNOSIS — E785 Hyperlipidemia, unspecified: Secondary | ICD-10-CM | POA: Diagnosis not present

## 2016-11-09 DIAGNOSIS — I482 Chronic atrial fibrillation, unspecified: Secondary | ICD-10-CM

## 2016-11-09 DIAGNOSIS — R600 Localized edema: Secondary | ICD-10-CM

## 2016-11-09 DIAGNOSIS — I10 Essential (primary) hypertension: Secondary | ICD-10-CM

## 2016-11-09 DIAGNOSIS — E118 Type 2 diabetes mellitus with unspecified complications: Secondary | ICD-10-CM | POA: Diagnosis not present

## 2016-11-09 DIAGNOSIS — G4733 Obstructive sleep apnea (adult) (pediatric): Secondary | ICD-10-CM

## 2016-11-09 DIAGNOSIS — Z9989 Dependence on other enabling machines and devices: Secondary | ICD-10-CM

## 2016-11-09 MED ORDER — HYDRALAZINE HCL 100 MG PO TABS: 100.0000 mg | ORAL_TABLET | Freq: Three times a day (TID) | ORAL | 3 refills | Status: DC

## 2016-11-09 NOTE — Patient Instructions (Addendum)
Medication Instructions:   START TAKING  HYDRALAZINE  100 MG THREE TIMES  A DAY   If you need a refill on your cardiac medications before your next appointment, please call your pharmacy.  Labwork:  NONE ORDERED  TODAY    Testing/Procedures: NONE ORDERED  TODAY'    Follow-Up: WITH HYPERTENSION CLINIC WITH PHARM D AFTER 11/23/2016   Any Other Special Instructions Will Be Listed Below (If Applicable).  YOU HAVE BEEN RECOMMENDED TO KEEP A BLOOD PRESSURE DIARY TAKING BLOOD PRESSURE TWICE A DAY. MAKE SURE YOU BRING WITH YOU TO NEXT APPOINTMENT

## 2016-11-09 NOTE — Progress Notes (Addendum)
Cardiology Office Note    Date:  11/09/2016   ID:  Dan [name], DOB [birth year], MRN [id number]  PCP:  Geoffery Lyons, MD  Cardiologist:  Dr. Martinique  Chief Complaint  Patient presents with  . Follow-up    patient reports he has been having blood pressure problems. D r A ronson has been trying to manage.    History of Present Illness:  Dan [name] is a 66 y.o. male with PMH of chronic atrial fibrillation on rate control and anticoagulation therapy (pradaxa), BPH, DM, HLD, HTN and OSA on CPAP. He had a stress Myoview in August 2014 that was low risk was small fixed apical defect, however no ischemia. He did have some lower extremity edema at the time which may have been related to high dose amlodipine.  He presents today for annual follow-up. He was recently seen by his PCP who noted his blood pressure has been elevated. He says his blood pressure has been running on the higher side for the past several month. His blood pressure diary indicated in the last 2 weeks, his as tolerated blood pressure has been ranging in the 140s to 170s. Dr. Reynaldo Minium has increased his hydralazine to 100 mg twice a day which he started yesterday. He denies any recent chest pain. He has not been very active. He has no cardiac awareness of atrial fibrillation. He has been taking Pradaxa faithfully. He usually checks his blood work at his PCPs office. He was not aware of any bleeding issue. I will increase his hydralazine to 100 mg 3 times a day. He is currently maxed out on amlodipine, bystolic, and also lisinopril as well. He is on Lasix 40 mg daily for lower extremity edema. He does have chronic 1-2+ pitting edema in bilateral lower extremity, however he says this is his usual amount of edema. He denies any significant orthopnea or paroxysmal nocturnal dyspnea. I have advised him to take an additional dose of Lasix if she does notice any increased swelling. Otherwise I have increased his hydralazine to 100  mg 3 times a day. I will refer him to our hypertension clinic. He is also scheduled for renal artery Doppler and follow-up with Dr. Martinique in March. He is going to [place] for business trip this weekend and will return next weekend.   Past Medical History:  Diagnosis Date  . Arthritis    osteoarthritis left knee  . BPH (benign prostatic hypertrophy)   . Chronic atrial fibrillation ([place])   . Colon polyps    adenomatous  . Diabetes mellitus   . Diverticulosis   . Dysrhythmia    tx. Pradaxa- Dr. Martinique follows  . GERD (gastroesophageal reflux disease)   . Hemorrhoids   . Hyperlipidemia   . Hypertension   . Morbid obesity ([place])   . Sleep apnea    CPAP nightly    Past Surgical History:  Procedure Laterality Date  . KNEE ARTHROSCOPY Left 03/28/2014   Procedure: LEFT KNEE ARTHROSCOPY WITH DEBRIDEMENT  ;  Surgeon: Johnn Hai, MD;  Location: [facility];  Service: Orthopedics;  Laterality: Left;  . TONSILLECTOMY    . TOTAL KNEE ARTHROPLASTY Left 04/30/2015   Procedure: LEFT TOTAL KNEE ARTHROPLASTY;  Surgeon: Susa Day, MD;  Location: [facility];  Service: Orthopedics;  Laterality: Left;  . US ECHOCARDIOGRAPHY  08/08/2006   ef 55-60%    Current Medications: Outpatient Medications Prior to Visit  Medication Sig Dispense Refill  . acetaminophen (TYLENOL) 500 MG tablet  Take 500-1,000 mg by mouth every 6 (six) hours as needed for mild pain.     Marland Kitchen albuterol (PROVENTIL HFA;VENTOLIN HFA) 108 (90 BASE) MCG/ACT inhaler Inhale 2 puffs into the lungs every 6 (six) hours as needed for wheezing or shortness of breath. 1 Inhaler 2  . amLODipine (NORVASC) 10 MG tablet Take 10 mg by mouth every morning.     Marland Kitchen BYSTOLIC 10 MG tablet Take 40 mg by mouth every morning.   2  . dabigatran (PRADAXA) 150 MG CAPS capsule Take 1 capsule (150 mg total) by mouth every 12 (twelve) hours. 180 capsule 0  . ezetimibe-simvastatin (VYTORIN) 10-20 MG per tablet Take 1 tablet by mouth every morning.       . fluticasone (FLONASE) 50 MCG/ACT nasal spray Place 2 sprays into both nostrils daily as needed.  11  . furosemide (LASIX) 40 MG tablet Take 1 tablet (40 mg total) by mouth daily. 30 tablet 5  . linagliptin (TRADJENTA) 5 MG TABS tablet Take 5 mg by mouth every morning.    Marland Kitchen lisinopril (PRINIVIL,ZESTRIL) 40 MG tablet Take 40 mg by mouth every morning.     . metFORMIN (GLUCOPHAGE) 1000 MG tablet Take 1 tablet by mouth 2 (two) times daily with a meal.     . oxyCODONE-acetaminophen (PERCOCET) 10-325 MG per tablet Take 1 tablet by mouth every 4 (four) hours as needed for pain. 60 tablet 0  . sitaGLIPtin (JANUVIA) 50 MG tablet Take 50 mg by mouth daily.    Marland Kitchen omeprazole (PRILOSEC) 20 MG capsule TAKE 1 CAPSULE BY MOUTH DAILY 90 capsule 0  . omeprazole (PRILOSEC) 20 MG capsule TAKE 1 CAPSULE BY MOUTH DAILY 30 capsule 2   No facility-administered medications prior to visit.      Allergies:   Macadamia nut oil   Social History   Social History  . Marital status: Married    Spouse name: N/A  . Number of children: 2  . Years of education: N/A   Occupational History  . 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 Smoking status: Former Smoker    Quit date: 06/20/1991  . Smokeless tobacco: Former Systems developer  . Alcohol use 0.0 oz/week     Comment: social  . Drug use: No  . Sexual activity: Yes   Other Topics Concern  . None   Social History Narrative  . None     Family History:  The patient's family history includes Colon cancer in his mother; Heart attack in his father.   ROS:   Please see the history of present illness.    ROS All other systems reviewed and are negative.   PHYSICAL EXAM:   VS:  BP (!) 162/99   Pulse 79   Ht 6\' 1"  (1.854 m)   Wt 281 lb (127.5 kg)   BMI 37.07 kg/m    GEN: Well nourished, well developed, in no acute distress  HEENT: normal  Neck: no JVD, carotid bruits, or masses Cardiac: irregularly irregular; no murmurs, rubs,  or gallops. 1-2+ edema  Respiratory:  clear to auscultation bilaterally, normal work of breathing GI: soft, nontender, nondistended, + BS MS: no deformity or atrophy  Skin: warm and dry, no rash Neuro:  Alert and Oriented x 3, Strength and sensation are intact Psych: euthymic mood, full affect  Wt Readings from Last 3 Encounters:  11/09/16 281 lb (127.5 kg)  12/03/15 265 lb (120.2 kg)  04/30/15 286 lb (129.7 kg)  Studies/Labs Reviewed:   EKG:  EKG is ordered today.  The ekg ordered today demonstrates Chronic atrial fibrillation, rate controlled, heart rate 79. No significant T-wave changes or ST segment changes.  Recent Labs: No results found for requested labs within last 8760 hours.   Lipid Panel No results found for: CHOL, TRIG, HDL, CHOLHDL, VLDL, LDLCALC, LDLDIRECT  Additional studies/ records that were reviewed today include:   Myoview 05/07/2013 Impression Exercise Capacity:  Below average. BP Response:  Normal blood pressure response. Clinical Symptoms:  Fatigue, no chest pain.  ECG Impression:  No significant ST segment change suggestive of ischemia. Comparison with Prior Nuclear Study: No images to compare  Overall Impression:  Low risk stress nuclear study.  There was a fixed small, mild apical septal perfusion defect.  This represents prior MI versus attenuation.  No ischemia.   LV Ejection Fraction: Study not gated.  LV Wall Motion:  Not gated.     ASSESSMENT:    1. Essential hypertension   2. Chronic a-fib ([place])   3. Hyperlipidemia, unspecified hyperlipidemia type   4. Controlled type 2 diabetes mellitus with complication, without long-term current use of insulin ([place])   5. OSA on CPAP   6. Lower extremity edema      PLAN:  In order of problems listed above:  1. Uncontrolled hypertension: Pending upcoming renal artery ultrasound. He is already maxed out on lisinopril, amlodipine and Bystolic. I will increase his hydralazine to 100 mg 3 times a  day. He will keep a blood pressure diary for the next 2 weeks. We will check his compliance with CPAP on the next visit, which could be something else that contribute to his uncontrolled HTN. Refer to HTN clinic.  2. Chronic atrial fibrillation on pradaxa  - This patients CHA2DS2-VASc Score and unadjusted Ischemic Stroke Rate (% per year) is equal to 3.2 % stroke rate/year from a score of 3  Above score calculated as 1 point each if present [CHF, HTN, DM, Vascular=MI/PAD/Aortic Plaque, Age if 65-74, or Male] Above score calculated as 2 points each if present [Age > 75, or Stroke/TIA/TE]  - rate controlled on 40mg  daily Bystolic. No bleeding issue on pradaxa.  3. HLD: continue Vytorin  4. DM II: on oral antihyperglycemic medications.   5. BLE edema: According to the patient, this is his usual edema. He is on 40 mg daily of Lasix, if lower extremity edema worsens, he will take an additional dose of Lasix.    Medication Adjustments/Labs and Tests Ordered: Current medicines are reviewed at length with the patient today.  Concerns regarding medicines are outlined above.  Medication changes, Labs and Tests ordered today are listed in the Patient Instructions below. Patient Instructions  Medication Instructions:   START TAKING  HYDRALAZINE  100 MG THREE TIMES  A DAY   If you need a refill on your cardiac medications before your next appointment, please call your pharmacy.  Labwork:  NONE ORDERED  TODAY    Testing/Procedures: NONE ORDERED  TODAY'    Follow-Up: WITH HYPERTENSION CLINIC WITH PHARM D AFTER 11/23/2016   Any Other Special Instructions Will Be Listed Below (If Applicable).  YOU HAVE BEEN RECOMMENDED TO KEEP A BLOOD PRESSURE DIARY TAKING BLOOD PRESSURE TWICE A DAY. MAKE SURE YOU BRING WITH YOU TO NEXT APPOINTMENT  Hilbert Corrigan, [place]  11/09/2016 12:20 PM    [facility] [address], [place], [place]  [address] Phone: [phone]; Fax: [phone]

## 2016-11-23 ENCOUNTER — Ambulatory Visit
Admission: RE | Admit: 2016-11-23 | Discharge: 2016-11-23 | Disposition: A | Discharge status: Home / Self Care | Payer: 59 | Source: Ambulatory Visit | Attending: Internal Medicine | Admitting: Internal Medicine

## 2016-11-23 DIAGNOSIS — I10 Essential (primary) hypertension: Secondary | ICD-10-CM

## 2016-11-27 NOTE — Progress Notes (Signed)
Patient ID: Dan [name]                 DOB: 1951/01/20                      MRN: [id number]     HPI:  Dan [name] is a 66 y.o. male patient of Dr Martinique referred by Almyra Deforest PA to HTN clinic.  PMH includes AFib, DM, hyperlipidemia, and hypertension.  Also noted patient has chronic pitting edema in bilateral lower extremities.  Blood pressure has been elevated in the 140-170s recently.  Hydralazine dose was increased to 100mg  three times daily on 11/09/2016.   Recent blood work including BMET done by PCP.  Patient presents today for evaluation. Denies dizziness, chest pain or additional swelling. Reports BP reading of 160-174/91-96 at home but no records available today. No problems with medication compliance noted.  Current HTN meds:    Bystolic 40mg  daily   amlodipine 10mg  daily   Hydralazine 100mg  three times daily   Lisinopril 40mg  daily   Furosemide 40mg  daily  BP goal: <130/80  Family History:  Colon cancer in his mother; Heart attack in his father  Social History: former smoker, denies smokeless tobacco, occasional alcohol intake, nicorette gum occasional  Diet: most home cooked meals, low sodium ,and lots of fish and veggetables  Exercise: walking but less than before  Home BP readings:  No records available; [id number] systolic; 99991111 diastolic  Wt Readings from Last 3 Encounters:  11/09/16 281 lb (127.5 kg)  12/03/15 265 lb (120.2 kg)  04/30/15 286 lb (129.7 kg)   BP Readings from Last 3 Encounters:  11/28/16 (!) 144/88  11/09/16 (!) 162/99  12/03/15 (!) 144/88   Pulse Readings from Last 3 Encounters:  11/28/16 68  11/09/16 79  12/03/15 79    Past Medical History:  Diagnosis Date  . Arthritis    osteoarthritis left knee  . BPH (benign prostatic hypertrophy)   . Chronic atrial fibrillation ([place])   . Colon polyps    adenomatous  . Diabetes mellitus   . Diverticulosis   . Dysrhythmia    tx. Pradaxa- Dr. Martinique follows  . GERD (gastroesophageal  reflux disease)   . Hemorrhoids   . Hyperlipidemia   . Hypertension   . Morbid obesity ([place])   . Sleep apnea    CPAP nightly    Current Outpatient Prescriptions on File Prior to Visit  Medication Sig Dispense Refill  . acetaminophen (TYLENOL) 500 MG tablet Take 500-1,000 mg by mouth every 6 (six) hours as needed for mild pain.     Marland Kitchen albuterol (PROVENTIL HFA;VENTOLIN HFA) 108 (90 BASE) MCG/ACT inhaler Inhale 2 puffs into the lungs every 6 (six) hours as needed for wheezing or shortness of breath. 1 Inhaler 2  . amLODipine (NORVASC) 10 MG tablet Take 10 mg by mouth every morning.     Marland Kitchen BYSTOLIC 10 MG tablet Take 40 mg by mouth every morning.   2  . dabigatran (PRADAXA) 150 MG CAPS capsule Take 1 capsule (150 mg total) by mouth every 12 (twelve) hours. 180 capsule 0  . ezetimibe-simvastatin (VYTORIN) 10-20 MG per tablet Take 1 tablet by mouth every morning.     . fluticasone (FLONASE) 50 MCG/ACT nasal spray Place 2 sprays into both nostrils daily as needed.  11  . furosemide (LASIX) 40 MG tablet Take 1 tablet (40 mg total) by mouth daily. 30 tablet 5  . hydrALAZINE (APRESOLINE) 100 MG tablet  Take 1 tablet (100 mg total) by mouth 3 (three) times daily. 90 tablet 3  . linagliptin (TRADJENTA) 5 MG TABS tablet Take 5 mg by mouth every morning.    . metFORMIN (GLUCOPHAGE) 1000 MG tablet Take 1 tablet by mouth 2 (two) times daily with a meal.     . oxyCODONE-acetaminophen (PERCOCET) 10-325 MG per tablet Take 1 tablet by mouth every 4 (four) hours as needed for pain. 60 tablet 0  . sitaGLIPtin (JANUVIA) 50 MG tablet Take 50 mg by mouth daily.     No current facility-administered medications on file prior to visit.     Allergies  Allergen Reactions  . Macadamia Nut Oil Shortness Of Breath    Blood pressure (!) 144/88, pulse 68, SpO2 97 %.  Essential hypertension:  Blood pressure today improved from previous readings but still above desired goal of <130/80.  Patient also experiencing frequent  headaches in the morning.  Will discontinue Lisinopril 40mg  daily and start valsartan 160mg  daily.  Patient to measure BP 1-2 times per day and keep records of readings to bring to next office visit.   Will obtain copy of most recent BMET from PCP.  Plan to increase valsartan to 320mg  daily and/or add chlorthalidone 25mg  if BP remains above goal.  Follow up with cardiologist in 1 week and with HTN clinic in 3 weeks.  Ladeana Laplant Rodriguez-Guzman PharmD, [place] [id number] 11/28/2016 9:11 PM

## 2016-11-28 ENCOUNTER — Ambulatory Visit (INDEPENDENT_AMBULATORY_CARE_PROVIDER_SITE_OTHER): Payer: 59 | Admitting: Pharmacist

## 2016-11-28 VITALS — BP 144/88 | HR 68

## 2016-11-28 DIAGNOSIS — I10 Essential (primary) hypertension: Secondary | ICD-10-CM

## 2016-11-28 MED ORDER — VALSARTAN 160 MG PO TABS: 160.0000 mg | ORAL_TABLET | Freq: Every day | ORAL | 0 refills | Status: DC

## 2016-11-28 NOTE — Patient Instructions (Addendum)
Return for a  follow up appointment in 3 weeks (next appointment with Dr Martinique   Your blood pressure today is 144/88 pulse 68  Check your blood pressure at home daily (if able) and keep record of the readings.  Take your BP meds as follows:   Bystolic 40mg  daily   amlodipine 10mg  daily   Hydralazine 100mg  three times daily   Furosemide 40mg  daily  **STOP taking lisinopril 40mg ** **Start valsartan 160mg  daily**   Bring all of your meds, your BP cuff and your record of home blood pressures to your next appointment.  Exercise as you're able, try to walk approximately 30 minutes per day.  Keep salt intake to a minimum, especially watch canned and prepared boxed foods.  Eat more fresh fruits and vegetables and fewer canned items.  Avoid eating in fast food restaurants.    HOW TO TAKE YOUR BLOOD PRESSURE: . Rest 5 minutes before taking your blood pressure. .  Don't smoke or drink caffeinated beverages for at least 30 minutes before. . Take your blood pressure before (not after) you eat. . Sit comfortably with your back supported and both feet on the floor (don't cross your legs). . Elevate your arm to heart level on a table or a desk. . Use the proper sized cuff. It should fit smoothly and snugly around your bare upper arm. There should be enough room to slip a fingertip under the cuff. The bottom edge of the cuff should be 1 inch above the crease of the elbow. . Ideally, take 3 measurements at one sitting and record the average.

## 2016-11-29 ENCOUNTER — Encounter: Payer: Self-pay | Admitting: *Deleted

## 2016-12-02 ENCOUNTER — Ambulatory Visit (INDEPENDENT_AMBULATORY_CARE_PROVIDER_SITE_OTHER): Payer: 59 | Admitting: Physician Assistant

## 2016-12-02 ENCOUNTER — Encounter: Payer: Self-pay | Admitting: Physician Assistant

## 2016-12-02 ENCOUNTER — Other Ambulatory Visit (INDEPENDENT_AMBULATORY_CARE_PROVIDER_SITE_OTHER): Payer: 59

## 2016-12-02 VITALS — BP 150/88 | HR 88 | Ht 71.75 in | Wt 282.5 lb

## 2016-12-02 DIAGNOSIS — R938 Abnormal findings on diagnostic imaging of other specified body structures: Secondary | ICD-10-CM | POA: Diagnosis not present

## 2016-12-02 DIAGNOSIS — K8020 Calculus of gallbladder without cholecystitis without obstruction: Secondary | ICD-10-CM | POA: Diagnosis not present

## 2016-12-02 DIAGNOSIS — R9389 Abnormal findings on diagnostic imaging of other specified body structures: Secondary | ICD-10-CM

## 2016-12-02 LAB — CBC WITH DIFFERENTIAL/PLATELET
Basophils Absolute: 0.1 10*3/uL (ref 0.0–0.1)
Basophils Relative: 1 % (ref 0.0–3.0)
EOS PCT: 2.7 % (ref 0.0–5.0)
Eosinophils Absolute: 0.2 10*3/uL (ref 0.0–0.7)
HCT: 45.5 % (ref 39.0–52.0)
Hemoglobin: 15 g/dL (ref 13.0–17.0)
LYMPHS ABS: 1 10*3/uL (ref 0.7–4.0)
Lymphocytes Relative: 12.9 % (ref 12.0–46.0)
MCHC: 32.9 g/dL (ref 30.0–36.0)
MCV: 86.4 fl (ref 78.0–100.0)
MONOS PCT: 7.8 % (ref 3.0–12.0)
Monocytes Absolute: 0.6 10*3/uL (ref 0.1–1.0)
NEUTROS PCT: 75.6 % (ref 43.0–77.0)
Neutro Abs: 6 10*3/uL (ref 1.4–7.7)
PLATELETS: 224 10*3/uL (ref 150.0–400.0)
RBC: 5.27 Mil/uL (ref 4.22–5.81)
RDW: 15.1 % (ref 11.5–15.5)
WBC: 8 10*3/uL (ref 4.0–10.5)

## 2016-12-02 LAB — COMPREHENSIVE METABOLIC PANEL
ALT: 25 U/L (ref 0–53)
AST: 25 U/L (ref 0–37)
Albumin: 4.5 g/dL (ref 3.5–5.2)
Alkaline Phosphatase: 90 U/L (ref 39–117)
BUN: 14 mg/dL (ref 6–23)
CHLORIDE: 101 meq/L (ref 96–112)
CO2: 31 meq/L (ref 19–32)
Calcium: 10.6 mg/dL — ABNORMAL HIGH (ref 8.4–10.5)
Creatinine, Ser: 0.78 mg/dL (ref 0.40–1.50)
GFR: 105.24 mL/min (ref >60.00)
GLUCOSE: 170 mg/dL — AB (ref 70–99)
POTASSIUM: 4.2 meq/L (ref 3.5–5.1)
Sodium: 139 mEq/L (ref 135–145)
Total Bilirubin: 1.4 mg/dL — ABNORMAL HIGH (ref 0.2–1.2)
Total Protein: 8.5 g/dL — ABNORMAL HIGH (ref 6.0–8.3)

## 2016-12-02 LAB — PROTIME-INR
INR: 1.3 ratio — ABNORMAL HIGH (ref 0.8–1.0)
PROTHROMBIN TIME: 13.8 s — AB (ref 9.6–13.1)

## 2016-12-02 LAB — HEPATITIS B SURFACE ANTIGEN: Hepatitis B Surface Ag: NEGATIVE

## 2016-12-02 LAB — FERRITIN: Ferritin: 85.7 ng/mL (ref 22.0–322.0)

## 2016-12-02 LAB — HEPATITIS B SURFACE ANTIBODY,QUALITATIVE: Hep B S Ab: NEGATIVE

## 2016-12-02 LAB — HEPATITIS C ANTIBODY: HCV AB: NEGATIVE

## 2016-12-02 NOTE — Progress Notes (Signed)
Subjective:    Patient ID: Dan [name], male    DOB: [birth year], 66 y.o.   MRN: [id number]  HPI [name] is a pleasant 66 year old white male, known to Dr. Henrene Pastor from previous endoscopic procedures who is referred today by Dr. Reynaldo Minium for evaluation of abnormal ultrasound of the liver. Patient has history of atrial fibrillation for which she is on Pradaxa, hypertension, adult onset diabetes mellitus, colon polyps, and obesity. He had undergone an EGD in March 2016 with finding of GERD and mild distal esophagitis. Colonoscopy in March 2016 showed moderate diverticulosis and he had a single 4 mm polyp removed which was a tubular adenoma.  Patient had undergone renal ultrasound earlier this month for evaluation of recent increase in hypertension. This showed the liver to be heterogeneous with nodularity of the hepatic contour with trace amount of perihepatic ascites was also a 1.1 cm gallstone. Findings felt worrisome for underlying cirrhosis. Patient has no previous history of liver disease that he is aware of. There is no family history of liver disease or cirrhosis that he is aware of. He states that he had been a fairly heavy drinker in his 39s to 91s and that his job entails a lot of entertaining and he generally has 2-3 drinks per evening for 5 days per week. He has no current GI complaints.  Review of Systems Pertinent positive and negative review of systems were noted in the above HPI section.  All other review of systems was otherwise negative.  Outpatient Encounter Prescriptions as of 12/02/2016  Medication Sig  . acetaminophen (TYLENOL) 500 MG tablet Take 500-1,000 mg by mouth every 6 (six) hours as needed for mild pain.   Marland Kitchen albuterol (PROVENTIL HFA;VENTOLIN HFA) 108 (90 BASE) MCG/ACT inhaler Inhale 2 puffs into the lungs every 6 (six) hours as needed for wheezing or shortness of breath.  Marland Kitchen amLODipine (NORVASC) 10 MG tablet Take 10 mg by mouth every morning.   Marland Kitchen BYSTOLIC 10 MG tablet  Take 40 mg by mouth every morning.   . dabigatran (PRADAXA) 150 MG CAPS capsule Take 1 capsule (150 mg total) by mouth every 12 (twelve) hours.  Marland Kitchen ezetimibe-simvastatin (VYTORIN) 10-20 MG per tablet Take 1 tablet by mouth every morning.   . fluticasone (FLONASE) 50 MCG/ACT nasal spray Place 2 sprays into both nostrils daily as needed.  . furosemide (LASIX) 40 MG tablet Take 1 tablet (40 mg total) by mouth daily.  . hydrALAZINE (APRESOLINE) 100 MG tablet Take 1 tablet (100 mg total) by mouth 3 (three) times daily.  Marland Kitchen linagliptin (TRADJENTA) 5 MG TABS tablet Take 5 mg by mouth every morning.  . metFORMIN (GLUCOPHAGE) 1000 MG tablet Take 1 tablet by mouth 2 (two) times daily with a meal.   . oxyCODONE-acetaminophen (PERCOCET) 10-325 MG per tablet Take 1 tablet by mouth every 4 (four) hours as needed for pain.  . sitaGLIPtin (JANUVIA) 50 MG tablet Take 50 mg by mouth daily.  . valsartan (DIOVAN) 160 MG tablet Take 1 tablet (160 mg total) by mouth daily.   No facility-administered encounter medications on file as of 12/02/2016.    Allergies  Allergen Reactions  . Macadamia Nut Oil Shortness Of Breath   Patient Active Problem List   Diagnosis Date Noted  . Primary osteoarthritis of left knee 04/30/2015  . Left knee DJD 04/30/2015  . Pneumonia 11/02/2014  . Hypertension   . Diabetes mellitus ([place])   . Hyperlipidemia   . OTHER AND UNSPECIFIED COAGULATION DEFECTS 09/28/2010  .  ATRIAL FIBRILLATION 09/28/2010  . PERSONAL HX COLONIC POLYPS 09/28/2010   Social History   Social History  . Marital status: Married    Spouse name: N/A  . Number of children: 2  . Years of education: N/A   Occupational History  . 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 Smoking status: Former Smoker    Quit date: 06/20/1991  . Smokeless tobacco: Former Systems developer  . Alcohol use 0.0 oz/week     Comment: social  . Drug use: No  . Sexual activity: Yes   Other Topics Concern  .  Not on file   Social History Narrative  . No narrative on file    Mr. Tarter's family history includes Breast cancer in his sister; Colon cancer in his mother; Heart attack in his father.      Objective:    Vitals:   12/02/16 0849  BP: (!) 150/88  Pulse: 88    Physical Exam  well-developed older white male in no acute distress, pleasant, blood pressure 150/88 pulse 88, Height 5 foot 11, Weight 282, B MI 38.5. HEENT; nontraumatic, spell acute EOMI PERRLA sclera anicteric, Cardiovascular; regular rate and rhythm with S1-S2 no murmur or gallop, Pulmonary; clear bilaterally, Abdomen; obese soft nontender nondistended bowel sounds are active there is no palpable mass or hepatosplenomegaly, Rectal; exam not done, Extremities; no clubbing cyanosis or edema skin warm and dry, Neuropsych; mood and affect appropriate       Assessment & Plan:   #68 66 year old white male with recent renal ultrasound showing hepatic nodularity concerning for cirrhosis. Pt with no prior history of liver disease. Rule out NASH, rule out EtOH induced, rule out hepatitis C, and we will also rule out other autoimmune/genetic hepatic disorders.  #2 chronic GERD #3 history of adenomatous colon polyps-up-to-date with colonoscopy last exam March 2016 #4 hypertension #5 adult-onset diabetes mellitus  #6 atrial fibrillation-on Pradaxa #7 obesity  Plan; Will schedule for CT of the abdomen and pelvis with contrast Check CBC with differential, ProTime/INR, see met, ferritin, AMA/AMA/ceruloplasmin/alpha-1 antitrypsin and will also check chronic hepatitis serologies We discussed that the most likely cause of any underlying liver disease is fatty liver, and as he has been an  almost daily drinker that alcohol certainly is playing a role. He is encouraged to decrease alcohol intake to one or less drinks per day. We'll plan office follow-up with Dr. Henrene Pastor or myself in one month to review above.  Joury Allcorn S Caidon Foti  PA-C 12/02/2016   Cc: Burnard Bunting, MD

## 2016-12-02 NOTE — Patient Instructions (Addendum)
You have been scheduled for a CT scan of the abdomen and pelvis at [facility] ([address]---this is in the same building as Press photographer).   You are scheduled on Friday, 12/09/16 at 1:00 pm. You should arrive 15 minutes prior to your appointment time for registration. Please follow the written instructions below on the day of your exam:  WARNING: IF YOU ARE ALLERGIC TO IODINE/X-RAY DYE, PLEASE NOTIFY RADIOLOGY IMMEDIATELY AT [phone]! YOU WILL BE GIVEN A 13 HOUR PREMEDICATION PREP.  1) Do not eat or drink anything after 9:00 am (4 hours prior to your test) 2) You have been given 2 bottles of oral contrast to drink. The solution may taste               better if refrigerated, but do NOT add ice or any other liquid to this solution.            Shake well before drinking.    Drink 1 bottle of contrast @ 11:00 am (2 hours prior to your exam)  Drink 1 bottle of contrast @ 12:00 pm (1 hour prior to your exam)  You may take any medications as prescribed with a small amount of water except for the following: Metformin, Glucophage, Glucovance, Avandamet, Riomet, Fortamet, Actoplus Met, Janumet, Glumetza or Metaglip. The above medications must be held the day of the exam AND 48 hours after the exam.  The purpose of you drinking the oral contrast is to aid in the visualization of your intestinal tract. The contrast solution may cause some diarrhea. Before your exam is started, you will be given a small amount of fluid to drink. Depending on your individual set of symptoms, you may also receive an intravenous injection of x-ray contrast/dye. Plan on being at [facility] for 30 minutes or longer, depending on the type of exam you are having performed.  This test typically takes 30-45 minutes to complete.  If you have any questions regarding your exam or if you need to reschedule, you may call the CT department at [phone] between the hours of 8:00 am and 5:00 pm,  Monday-Friday.  ___________________________________________________________________ Your physician has requested that you go to the basement for the following lab work before leaving today: CBC, Ferritin, PT/INR, alpha 1 antitrypsin, AMA, ANA, ASMA, Ceruloplasmin, Hep B surface antibody, Hep B surface antigen, Hep C antibody  Please follow up with Dr Henrene Pastor on Friday, 01/27/17 @ 9:15 am.   Please make sure to decrease your alcohol intake to once drink daily.  If you are age 51 or older, your body mass index should be between 23-30. Your Body mass index is 38.58 kg/m. If this is out of the aforementioned range listed, please consider follow up with your Primary Care Provider.  If you are age 32 or younger, your body mass index should be between 19-25. Your Body mass index is 38.58 kg/m. If this is out of the aformentioned range listed, please consider follow up with your Primary Care Provider.

## 2016-12-05 LAB — ANA: Anti Nuclear Antibody(ANA): NEGATIVE

## 2016-12-05 LAB — ALPHA-1-ANTITRYPSIN: A1 ANTITRYPSIN SER: 154 mg/dL (ref 83–199)

## 2016-12-05 LAB — ANTI-SMOOTH MUSCLE ANTIBODY, IGG

## 2016-12-05 LAB — MITOCHONDRIAL ANTIBODIES: Mitochondrial M2 Ab, IgG: <=20 Units (ref <=20.0)

## 2016-12-05 LAB — CERULOPLASMIN: CERULOPLASMIN: 29 mg/dL (ref 18–36)

## 2016-12-05 NOTE — Progress Notes (Signed)
Agree with initial assessment and plans 

## 2016-12-06 ENCOUNTER — Telehealth: Payer: Self-pay | Admitting: Internal Medicine

## 2016-12-06 ENCOUNTER — Ambulatory Visit: Payer: 59 | Admitting: Cardiology

## 2016-12-06 NOTE — Telephone Encounter (Signed)
Discussed with pt that he should be fine, should not be an issue having CT.

## 2016-12-07 NOTE — Progress Notes (Signed)
[name] Date of Birth: [birth year]   History of Present Illness Dan [name] is seen today for follow up of atrial fibrillation. He has a history of chronic atrial fibrillation that has been managed with rate control and anticoagulation. He also has a history of HTN, DM, obesity, and HLD. He was seen in February with uncontrolled HTN. On max doses of lisinopril, bystolic, and amlodipine. Also on lasix. Hydralazine increased to 100 mg tid. Lisinopril changed to valsartan. Seen today for follow up. 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 There was incidental findings of gallstones. The liver was nodular in appearance c/w cirrhosis.    He remains asymptomatic. He denies any chest pain, shortness of breath, or palpitations. He had left TKR last July 7001  without complication. He has not been exercising and since March 2017 has gained 18 lbs. He states he restricts salt intake. Bp readings by home monitor range from 749-449 systolic and 67-59 diastolic.  He had a stress Myoview in August 2014 which was low risk with a small fixed apical defect. No ischemia.   Current Outpatient Prescriptions on File Prior to Visit  Medication Sig Dispense Refill  . acetaminophen (TYLENOL) 500 MG tablet Take 500-1,000 mg by mouth every 6 (six) hours as needed for mild pain.     Marland Kitchen albuterol (PROVENTIL HFA;VENTOLIN HFA) 108 (90 BASE) MCG/ACT inhaler Inhale 2 puffs into the lungs every 6 (six) hours as needed for wheezing or shortness of breath. 1 Inhaler 2  . amLODipine (NORVASC) 10 MG tablet Take 10 mg by mouth every morning.     Marland Kitchen BYSTOLIC 10 MG tablet Take 40 mg by mouth every morning.   2  . dabigatran (PRADAXA) 150 MG CAPS capsule Take 1 capsule (150 mg total) by mouth every 12 (twelve) hours. 180 capsule 0  . ezetimibe-simvastatin (VYTORIN) 10-20 MG per tablet Take 1 tablet by mouth every morning.     . fluticasone (FLONASE) 50 MCG/ACT nasal spray Place 2 sprays into both  nostrils daily as needed.  11  . furosemide (LASIX) 40 MG tablet Take 1 tablet (40 mg total) by mouth daily. 30 tablet 5  . hydrALAZINE (APRESOLINE) 100 MG tablet Take 1 tablet (100 mg total) by mouth 3 (three) times daily. 90 tablet 3  . linagliptin (TRADJENTA) 5 MG TABS tablet Take 5 mg by mouth every morning.    . metFORMIN (GLUCOPHAGE) 1000 MG tablet Take 1 tablet by mouth 2 (two) times daily with a meal.     . oxyCODONE-acetaminophen (PERCOCET) 10-325 MG per tablet Take 1 tablet by mouth every 4 (four) hours as needed for pain. 60 tablet 0  . sitaGLIPtin (JANUVIA) 50 MG tablet Take 50 mg by mouth daily.    . valsartan (DIOVAN) 160 MG tablet Take 1 tablet (160 mg total) by mouth daily. 30 tablet 0   No current facility-administered medications on file prior to visit.     Allergies  Allergen Reactions  . Macadamia Nut Oil Shortness Of Breath    Past Medical History:  Diagnosis Date  . Arthritis    osteoarthritis left knee  . BPH (benign prostatic hypertrophy)   . Cholelithiasis   . Chronic atrial fibrillation ([place])   . Colon polyps    adenomatous  . Diabetes mellitus   . Diverticulosis   . Dysrhythmia    tx. Pradaxa- Dr. Martinique follows  . Elevated LFTs   . GERD (gastroesophageal reflux disease)   .  Hemorrhoids   . Hyperlipidemia   . Hypertension   . Morbid obesity ([place])   . Sleep apnea    CPAP nightly  . Tubular adenoma of colon     Past Surgical History:  Procedure Laterality Date  . KNEE ARTHROSCOPY Left 03/28/2014   Procedure: LEFT KNEE ARTHROSCOPY WITH DEBRIDEMENT  ;  Surgeon: Johnn Hai, MD;  Location: [facility];  Service: Orthopedics;  Laterality: Left;  . TONSILLECTOMY    . TOTAL KNEE ARTHROPLASTY Left 04/30/2015   Procedure: LEFT TOTAL KNEE ARTHROPLASTY;  Surgeon: Susa Day, MD;  Location: [facility];  Service: Orthopedics;  Laterality: Left;  . US ECHOCARDIOGRAPHY  08/08/2006   ef 55-60%    History  Smoking Status  . Former Smoker   . Quit date: 06/20/1991  Smokeless Tobacco  . Former User    History  Alcohol Use  . 0.0 oz/week    Comment: social    Family History  Problem Relation Age of Onset  . Heart attack Father   . Colon cancer Mother   . Breast cancer Sister     Review of Systems: As per history of present illness.  All other systems were reviewed and are negative.  Physical Exam: BP (!) 154/84 (BP Location: Right Arm, Patient Position: Sitting, Cuff Size: Large)   Pulse 84   Ht 6' (1.829 m)   Wt 282 lb (127.9 kg)   BMI 38.25 kg/m    I checked BP at it was 156/84. By his cuff it was 168/99.  He is an obese white male in no distress.   The HEENT exam is normal.  The carotids are 2+ without bruits.  There is no thyromegaly.  There is no JVD.  The lungs are clear.    The heart exam reveals an irregular rate with a normal S1 and S2.  There are no murmurs, gallops, or rubs.  The PMI is not displaced.   Abdominal exam reveals good bowel sounds.    There are no masses.  Exam of the legs reveal 1+ edema.  The distal pulses are intact.  Cranial nerves II - XII are intact.  Motor and sensory functions are intact.  The gait is normal.  LABORATORY DATA: Lab Results  Component Value Date   WBC 8.0 12/02/2016   HGB 15.0 12/02/2016   HCT 45.5 12/02/2016   PLT 224.0 12/02/2016   GLUCOSE 170 (H) 12/02/2016   ALT 25 12/02/2016   AST 25 12/02/2016   NA 139 12/02/2016   K 4.2 12/02/2016   CL 101 12/02/2016   CREATININE 0.78 12/02/2016   BUN 14 12/02/2016   CO2 31 12/02/2016   INR 1.3 (H) 12/02/2016   Labs dated 06/10/16: cholesterol 112, triglycerides 122, HDL 37, LDL 51.  Dated 2.5.18: A1c 7.4%.   ECG today demonstrates atrial fibrillation with a controlled ventricular response of 79 beats per minute. It is otherwise normal. I have personally reviewed and interpreted this study.  EXAM: RENAL/URINARY TRACT ULTRASOUND  RENAL DUPLEX DOPPLER ULTRASOUND  COMPARISON:  CT abdomen pelvis -  02/02/2008  FINDINGS: Right Kidney:  Normal cortical thickness, echogenicity and size, measuring 14.7 cm in length. No focal renal lesions. No echogenic renal stones. No urinary obstruction.  Left Kidney:  Normal cortical thickness, echogenicity and size, measuring 13.7 cm in length. No focal renal lesions. No echogenic renal stones. No urinary obstruction.  Bladder:  Appears normal given degree distention.  RENAL DUPLEX ULTRASOUND  Right Renal Artery Velocities:  Origin:  126 cm/sec  Mid:  98 cm/sec  Hilum:  121 cm/sec  Interlobar:  44 cm/sec  Arcuate:  21 Cm/sec  Left Renal Artery Velocities:  Origin:  73 cm/sec  Mid:  92 cm/sec  Hilum:  84 cm/sec  Interlobar:  31 cm/sec  Arcuate:  17 cm/sec  Aortic Velocity:  103 Cm/sec  Right Renal-Aortic Ratios:  Origin: 1.2  Mid:  1.0  Hilum: 1.1  Interlobar: 0.4  Arcuate: 0.2  Left Renal-Aortic Ratios:  Origin: 0.7  Mid: 0.9  Hilum: 0.8  Interlobar: 0.3  Arcuate: 0.2  Normal arterial velocities and waveforms are demonstrated throughout the bilateral renal arteries and renal parenchyma.  The bilateral renal veins appear patent.  The liver is heterogeneous in appearance. There is nodularity of the hepatic contour with trace amount of perihepatic ascites (representative images 9 and 10). Note is made of a approximately 1.1 cm partially shadowing stone within neck and otherwise normal-appearing gallbladder (77).  IMPRESSION: 1. Normal renal ultrasound. No evidence of urinary obstruction, renal atrophy or renal artery stenosis. 2. Cholelithiasis without evidence of cholecystitis. 3. **An incidental finding of potential clinical significance has been found. Heterogeneous and nodular appearing liver with trace amount of perihepatic ascites, findings worrisome for cirrhosis. Further evaluation of LFTs is recommended. Further evaluation with abdominal MRI could be  performed as clinically indicated. ** These results will be called to the ordering clinician or representative by the Radiologist Assistant, and communication documented in the PACS or zVision Dashboard.   Electronically Signed   By: Sandi Mariscal M.D.   On: 11/23/2016 09:34  Assessment / Plan:d 1. Permanent atrial fibrillation. Rate is well controlled and he is anticoagulated with Pradaxa. He is asymptomatic. We will continue with his current therapy.  2. Low risk Myoview in 2014 without ischemia.  3. Hypertension-resistant. On multiple medication. Option now would be to increase valsartan further or to add additional therapy. I find that aldactone is a good choice in these resistant HTN patients. Will add aldactone 25 mg daily. Check BMET in 3-4 days. Follow up in our hypertension clinic in 2-3 weeks. Stressed importance of lifestyle modification in BP control. Needs to lose weight and increase aerobic activity. I will see in 4 months.  4. Diabetes mellitus type 2-controlled.  5. Hyperlipidemia on Vytorin.   6. Edema.   7. Abnormal liver by [place]- c/w cirrhosis.seeing Dr. Henrene Pastor and scheduled for CT today.

## 2016-12-09 ENCOUNTER — Ambulatory Visit (INDEPENDENT_AMBULATORY_CARE_PROVIDER_SITE_OTHER)
Admission: RE | Admit: 2016-12-09 | Discharge: 2016-12-09 | Disposition: A | Discharge status: Home / Self Care | Payer: 59 | Source: Ambulatory Visit | Attending: Physician Assistant | Admitting: Physician Assistant

## 2016-12-09 ENCOUNTER — Encounter: Payer: Self-pay | Admitting: Cardiology

## 2016-12-09 ENCOUNTER — Ambulatory Visit (INDEPENDENT_AMBULATORY_CARE_PROVIDER_SITE_OTHER): Payer: 59 | Admitting: Cardiology

## 2016-12-09 VITALS — BP 154/84 | HR 84 | Ht 72.0 in | Wt 282.0 lb

## 2016-12-09 DIAGNOSIS — I482 Chronic atrial fibrillation, unspecified: Secondary | ICD-10-CM

## 2016-12-09 DIAGNOSIS — R938 Abnormal findings on diagnostic imaging of other specified body structures: Secondary | ICD-10-CM

## 2016-12-09 DIAGNOSIS — I10 Essential (primary) hypertension: Secondary | ICD-10-CM

## 2016-12-09 DIAGNOSIS — E785 Hyperlipidemia, unspecified: Secondary | ICD-10-CM | POA: Diagnosis not present

## 2016-12-09 DIAGNOSIS — K8020 Calculus of gallbladder without cholecystitis without obstruction: Secondary | ICD-10-CM

## 2016-12-09 DIAGNOSIS — R9389 Abnormal findings on diagnostic imaging of other specified body structures: Secondary | ICD-10-CM

## 2016-12-09 MED ORDER — IOPAMIDOL (ISOVUE-300) INJECTION 61%
100.0000 mL | Freq: Once | INTRAVENOUS | Status: AC | PRN
  Administered 2016-12-09: 100 mL via INTRAVENOUS

## 2016-12-09 MED ORDER — SPIRONOLACTONE 25 MG PO TABS
25.0000 mg | ORAL_TABLET | Freq: Every day | ORAL | 6 refills | Status: DC
Start: 2016-12-09 — End: 2017-07-10

## 2016-12-09 NOTE — Patient Instructions (Addendum)
Work on weight loss and increased aerobic activity  We will add aldactone 25 mg daily to your current medication  Restrict salt intake  We will arrange follow up with our pharmacist and I will see you in 4 months.

## 2016-12-09 NOTE — Addendum Note (Signed)
Addended by: Kathyrn Lass on: 12/09/2016 09:10 AM   Modules accepted: Orders

## 2016-12-14 LAB — BASIC METABOLIC PANEL
BUN: 15 mg/dL (ref 7–25)
CHLORIDE: 99 mmol/L (ref 98–110)
CO2: 30 mmol/L (ref 20–31)
Calcium: 10.3 mg/dL (ref 8.6–10.3)
Creat: 0.83 mg/dL (ref 0.70–1.25)
Glucose, Bld: 187 mg/dL — ABNORMAL HIGH (ref 65–99)
POTASSIUM: 4.2 mmol/L (ref 3.5–5.3)
SODIUM: 140 mmol/L (ref 135–146)

## 2016-12-30 ENCOUNTER — Ambulatory Visit (INDEPENDENT_AMBULATORY_CARE_PROVIDER_SITE_OTHER): Payer: 59 | Admitting: Pharmacist Clinician (PhC)/ Clinical Pharmacy Specialist

## 2016-12-30 DIAGNOSIS — I10 Essential (primary) hypertension: Secondary | ICD-10-CM

## 2016-12-30 NOTE — Patient Instructions (Signed)
Return for a a follow up appointment in 2 months  Your blood pressure today is 148/86  (goal is < 130/80)  Check your blood pressure at home daily and keep record of the readings.  Take your BP meds as follows:  AM:  Hydralazine, valsartan, furosemide, spironolactone  Noon:  Hydralazine  PM:  Bystolic, amlodipine, hydralazine  Bring all of your meds, your BP cuff and your record of home blood pressures to your next appointment.  Exercise as you're able, try to walk approximately 30 minutes per day.  Keep salt intake to a minimum, especially watch canned and prepared boxed foods.  Eat more fresh fruits and vegetables and fewer canned items.  Avoid eating in fast food restaurants.    HOW TO TAKE YOUR BLOOD PRESSURE: . Rest 5 minutes before taking your blood pressure. .  Don't smoke or drink caffeinated beverages for at least 30 minutes before. . Take your blood pressure before (not after) you eat. . Sit comfortably with your back supported and both feet on the floor (don't cross your legs). . Elevate your arm to heart level on a table or a desk. . Use the proper sized cuff. It should fit smoothly and snugly around your bare upper arm. There should be enough room to slip a fingertip under the cuff. The bottom edge of the cuff should be 1 inch above the crease of the elbow. . Ideally, take 3 measurements at one sitting and record the average.

## 2016-12-30 NOTE — Progress Notes (Signed)
Patient ID: Dan [name]                 DOB: [birth year]                      MRN: [id number]     HPI:  SRIKAR [name] is a 66 y.o. male patient of Dr Martinique referred by Almyra Deforest PA to HTN clinic.  His past medical history is significant for AFib, DM, hyperlipidemia, and hypertension.  It is also noted patient has chronic pitting edema in bilateral lower extremities.  Blood pressure has been elevated in the 140-170s recently.  Hydralazine dose was increased to 100mg  three times daily on 11/09/2016.   Recent blood work including BMET done by PCP.  Patient presents today for evaluation. Denies dizziness, chest pain or additional swelling. Reports BP reading of 160-174/91-96 at home but no records available today. No problems with medication compliance noted.  Current HTN meds:    Bystolic 40mg  daily (am)   amlodipine 10mg  daily (am)   Hydralazine 100mg  three times daily   Valsartan 160 daily (am)   Furosemide 40mg  daily (am)   Spironolactone 25 mg qd (am)  BP goal: <130/80  Family History:  Colon cancer in his mother; Heart attack in his father  Social History: former smoker (28 yrs ago), denies smokeless tobacco, occasional alcohol intake, nicorette gum occasional  Diet: most home cooked meals, low sodium ,and lots of fish and vegetables; no coffee/tea, only occasional caffeine, rare soda, jsut Sprite; wife pescitarian, pt eats only occasional meats  Exercise: walking but less than before; prev 3 miles per day/ had knee issues  Home BP readings:  Omron 17 + year old cuff; read about 10 points higher than office cuff on two separate occasions.   Wt Readings from Last 3 Encounters:  12/09/16 282 lb (127.9 kg)  12/02/16 282 lb 8 oz (128.1 kg)  11/09/16 281 lb (127.5 kg)   BP Readings from Last 3 Encounters:  12/30/16 (!) 148/86  12/09/16 (!) 154/84  12/02/16 (!) 150/88   Pulse Readings from Last 3 Encounters:  12/30/16 76  12/09/16 84  12/02/16 88    Past Medical  History:  Diagnosis Date  . Arthritis    osteoarthritis left knee  . BPH (benign prostatic hypertrophy)   . Cholelithiasis   . Chronic atrial fibrillation ([place])   . Colon polyps    adenomatous  . Diabetes mellitus   . Diverticulosis   . Dysrhythmia    tx. Pradaxa- Dr. Martinique follows  . Elevated LFTs   . GERD (gastroesophageal reflux disease)   . Hemorrhoids   . Hyperlipidemia   . Hypertension   . Morbid obesity ([place])   . Sleep apnea    CPAP nightly  . Tubular adenoma of colon     Current Outpatient Prescriptions on File Prior to Visit  Medication Sig Dispense Refill  . acetaminophen (TYLENOL) 500 MG tablet Take 500-1,000 mg by mouth every 6 (six) hours as needed for mild pain.     Marland Kitchen albuterol (PROVENTIL HFA;VENTOLIN HFA) 108 (90 BASE) MCG/ACT inhaler Inhale 2 puffs into the lungs every 6 (six) hours as needed for wheezing or shortness of breath. 1 Inhaler 2  . amLODipine (NORVASC) 10 MG tablet Take 10 mg by mouth every morning.     Marland Kitchen BYSTOLIC 10 MG tablet Take 40 mg by mouth every morning.   2  . dabigatran (PRADAXA) 150 MG CAPS capsule Take 1 capsule (  150 mg total) by mouth every 12 (twelve) hours. 180 capsule 0  . doxycycline (MONODOX) 100 MG capsule Take 3 capsules by mouth daily.     Marland Kitchen ezetimibe-simvastatin (VYTORIN) 10-20 MG per tablet Take 1 tablet by mouth every morning.     . fluticasone (FLONASE) 50 MCG/ACT nasal spray Place 2 sprays into both nostrils daily as needed.  11  . furosemide (LASIX) 40 MG tablet Take 1 tablet (40 mg total) by mouth daily. 30 tablet 5  . hydrALAZINE (APRESOLINE) 100 MG tablet Take 1 tablet (100 mg total) by mouth 3 (three) times daily. 90 tablet 3  . linagliptin (TRADJENTA) 5 MG TABS tablet Take 5 mg by mouth every morning.    . metFORMIN (GLUCOPHAGE) 1000 MG tablet Take 1 tablet by mouth 2 (two) times daily with a meal.     . oxyCODONE-acetaminophen (PERCOCET) 10-325 MG per tablet Take 1 tablet by mouth every 4 (four) hours as needed for  pain. 60 tablet 0  . sitaGLIPtin (JANUVIA) 50 MG tablet Take 50 mg by mouth daily.    Marland Kitchen spironolactone (ALDACTONE) 25 MG tablet Take 1 tablet (25 mg total) by mouth daily. 30 tablet 6  . valsartan (DIOVAN) 160 MG tablet Take 1 tablet (160 mg total) by mouth daily. 30 tablet 0   No current facility-administered medications on file prior to visit.     Allergies  Allergen Reactions  . Macadamia Nut Oil Shortness Of Breath    Blood pressure (!) 148/86, pulse 76.  Essential hypertension:  Patient with hypertension currently on furosemide plus 5 anti-hypertensive medications, 3 at maximum dose.  He does take all blood pressure medications in the morning, in addition to mid-day and evening hydralazine doses.  While his blood pressure is not at goal today, he is just getting back into regular exercise after knee surgery and would like a chance to work on weight loss and lifestyle changes.  Will have him divide up his daily medications, rather than take everything at the same time (move amlodipine and Bystolic to evenings).  He was encouraged to get back into a healthy lifestyle and we will see him back in 2 months for follow up.    Tommy Medal PharmD CPP [facility] [address] [place] [address] 01/02/2017 7:27 AM

## 2017-01-02 NOTE — Assessment & Plan Note (Signed)
Patient with hypertension currently on furosemide plus 5 anti-hypertensive medications, 3 at maximum dose.  He does take all blood pressure medications in the morning, in addition to mid-day and evening hydralazine doses.  While his blood pressure is not at goal today, he is just getting back into regular exercise after knee surgery and would like a chance to work on weight loss and lifestyle changes.  Will have him divide up his daily medications, rather than take everything at the same time (move amlodipine and Bystolic to evenings).  He was encouraged to get back into a healthy lifestyle and we will see him back in 2 months for follow up.

## 2017-01-11 ENCOUNTER — Other Ambulatory Visit: Payer: Self-pay

## 2017-01-11 MED ORDER — VALSARTAN 160 MG PO TABS: 160.0000 mg | ORAL_TABLET | Freq: Every day | ORAL | 11 refills | Status: DC

## 2017-01-11 NOTE — Telephone Encounter (Signed)
Rx(s) sent to pharmacy electronically.  

## 2017-01-27 ENCOUNTER — Ambulatory Visit (INDEPENDENT_AMBULATORY_CARE_PROVIDER_SITE_OTHER): Payer: 59 | Admitting: Internal Medicine

## 2017-01-27 ENCOUNTER — Encounter: Payer: Self-pay | Admitting: Internal Medicine

## 2017-01-27 VITALS — BP 122/80 | HR 72 | Ht 72.0 in | Wt 284.0 lb

## 2017-01-27 DIAGNOSIS — K219 Gastro-esophageal reflux disease without esophagitis: Secondary | ICD-10-CM

## 2017-01-27 DIAGNOSIS — K7581 Nonalcoholic steatohepatitis (NASH): Secondary | ICD-10-CM

## 2017-01-27 DIAGNOSIS — K649 Unspecified hemorrhoids: Secondary | ICD-10-CM

## 2017-01-27 DIAGNOSIS — K7460 Unspecified cirrhosis of liver: Secondary | ICD-10-CM

## 2017-01-27 NOTE — Patient Instructions (Signed)
Please follow up one year 

## 2017-01-27 NOTE — Progress Notes (Signed)
HISTORY OF PRESENT ILLNESS:  Dan [name] is a 66 y.o. male with multiple medical problems as listed below who presents today for follow-up regarding possible hepatic cirrhosis. In followed in this office for GERD and colon polyp surveillance. Last upper endoscopy March 2016 was normal except for mild distal esophagitis for which omeprazole was prescribed. Colonoscopy that same time revealed a descending colon adenoma which was removed and moderate left-sided diverticulosis. Follow-up in 5 years recommended. He was seen by the GI physician assistant 12/02/2016 after a recent renal ultrasound revealed hepatic nodularity concerning for cirrhosis. Prior history of the same. No history of liver disease. Significant alcohol use in the past. Ongoing alcohol use, but more moderate, currently. Multiple laboratories and serologies were obtained to assess for viral nonviral causes for chronic liver disease. These were normal or negative. CBC was normal including platelets and MCV. INR was 1.3. He does have some edema on occasion. No other complaints. His GERD is managed with PPI. He is on chronic anticoagulation  REVIEW OF SYSTEMS:  All non-GI ROS unless otherwise stated in the history of present illness negative except for arthritis, sinus and allergy, irregular heartbeat, ankle swelling  Past Medical History:  Diagnosis Date  . Arthritis    osteoarthritis left knee  . BPH (benign prostatic hypertrophy)   . Cholelithiasis   . Chronic atrial fibrillation (Dan [name])   . Colon polyps    adenomatous  . Diabetes mellitus   . Diverticulosis   . Dysrhythmia    tx. Pradaxa- Dan [name] follows  . Elevated LFTs   . GERD (gastroesophageal reflux disease)   . Hemorrhoids   . Hyperlipidemia   . Hypertension   . Morbid obesity ([place])   . Sleep apnea    CPAP nightly  . Tubular adenoma of colon     Past Surgical History:  Procedure Laterality Date  . KNEE ARTHROSCOPY Left 03/28/2014   Procedure: LEFT KNEE  ARTHROSCOPY WITH DEBRIDEMENT  ;  Surgeon: Dan Hai, MD;  Location: [facility];  Service: Orthopedics;  Laterality: Left;  . TONSILLECTOMY    . TOTAL KNEE ARTHROPLASTY Left 04/30/2015   Procedure: LEFT TOTAL KNEE ARTHROPLASTY;  Surgeon: Dan Day, MD;  Location: [facility];  Service: Orthopedics;  Laterality: Left;  . US ECHOCARDIOGRAPHY  08/08/2006   ef 55-60%    Social History Dan [name]  reports that he quit smoking about 25 years ago. He has quit using smokeless tobacco. He reports that he drinks alcohol. He reports that he does not use drugs.  family history includes Breast cancer in his sister; Colon cancer in his mother; Heart attack in his father.  Allergies  Allergen Reactions  . Macadamia Nut Oil Shortness Of Breath       PHYSICAL EXAMINATION: Vital signs: BP 122/80   Pulse 72   Ht 6' (1.829 m)   Wt 284 lb (128.8 kg)   BMI 38.52 kg/m   Constitutional: Pleasant, obese, generally well-appearing, no acute distress Psychiatric: alert and oriented x3, cooperative Eyes: extraocular movements intact, anicteric, conjunctiva pink Mouth: oral pharynx moist, no lesions Neck: supple without thyromegaly Lymph: no lymphadenopathy Cardiovascular: heart regular rate and rhythm, no murmur Lungs: clear to auscultation bilaterally Abdomen: soft, obese, nontender, nondistended, no obvious ascites, no peritoneal signs, normal bowel sounds, no organomegaly Rectal: Omitted Extremities: no clubbing or cyanosis. Trace lower extremity edema bilaterally Skin: no lesions on visible extremities Neuro: No focal deficits. No asterixis.   ASSESSMENT:  #1. 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  Compensated. Suspect NASH versus alcohol versus combination #2. GERD. Previous EGD with endoscopic evidence for esophagitis. Asymptomatic on PPI #3. History of adenomatous colon polyp. #4. Multiple medical problems #5. Morbid obesity    PLAN:  #1. Reviewed chronic liver disease  in detail. Reviewed his workup. Reviewed potential complications and outcomes. #2. Recommended exercise and weight loss #3. Recommended discontinuation of alcohol. If not, then no more than 1-2 standard alcoholic beverages per [name] #4. Reflux precautions #5. Continue PPI #6. Surveillance colonoscopy around March 2021 #7. Routine GI follow-up one year. Sooner if needed #8. Ongoing general medical care with Dr. Reynaldo [name]  25 minutes spent face-to-face with the patient. Greater than 50% a time use for counseling regarding his multiple GI issues with the principal focus on liver disease

## 2017-02-24 ENCOUNTER — Ambulatory Visit: Payer: 59

## 2017-03-02 ENCOUNTER — Ambulatory Visit (INDEPENDENT_AMBULATORY_CARE_PROVIDER_SITE_OTHER): Payer: 59 | Admitting: Pharmacist

## 2017-03-02 VITALS — BP 118/70 | HR 68

## 2017-03-02 DIAGNOSIS — I10 Essential (primary) hypertension: Secondary | ICD-10-CM | POA: Diagnosis not present

## 2017-03-02 NOTE — Patient Instructions (Addendum)
Return for a  follow up appointment in as needed  Your blood pressure today is 118/70 pulse 68  Check your blood pressure at home daily (if able) and keep record of the readings.  Take your BP meds as follows:             AM:  Hydralazine, valsartan, furosemide, spironolactone             Noon:  Hydralazine             PM:  Bystolic, amlodipine, hydralazine  Bring all of your meds, your BP cuff and your record of home blood pressures to your next appointment.  Exercise as you're able, try to walk approximately 30 minutes per day.  Keep salt intake to a minimum, especially watch canned and prepared boxed foods.  Eat more fresh fruits and vegetables and fewer canned items.  Avoid eating in fast food restaurants.    HOW TO TAKE YOUR BLOOD PRESSURE: . Rest 5 minutes before taking your blood pressure. .  Don't smoke or drink caffeinated beverages for at least 30 minutes before. . Take your blood pressure before (not after) you eat. . Sit comfortably with your back supported and both feet on the floor (don't cross your legs). . Elevate your arm to heart level on a table or a desk. . Use the proper sized cuff. It should fit smoothly and snugly around your bare upper arm. There should be enough room to slip a fingertip under the cuff. The bottom edge of the cuff should be 1 inch above the crease of the elbow. . Ideally, take 3 measurements at one sitting and record the average.

## 2017-03-02 NOTE — Progress Notes (Signed)
Patient ID: Dan [name]                 DOB: 07-31-1951                      MRN: [id number]     HPI: Dan [name] is a 66 y.o. male patient of Dr Martinique referred by Almyra Deforest PA to HTN clinic.  PMH includes AFib,diabetes, hyperlipidemia, and hypertension.  It is also noted patient has chronic pitting edema in bilateral lower extremities.   During last office visit all medication was continued with only changes on administration times. Lifestyle modifications were also encouraged.  Patient presents today for follow up. Denies dizziness, chest pain or additional swelling. Reports BP reading in the 110-130s and express concern on due to significant drop. No problems with medication compliance were noted.  Current HTN meds:    AM:  Hydralazine, valsartan, furosemide, spironolactone             Noon:  Hydralazine             PM:  Bystolic, amlodipine, ydralazine  BP goal: <130/80  Family History:  Colon cancer in his mother; Heart attack in his father  Social History: former smoker (28 yrs ago), denies smokeless tobacco, occasional alcohol intake, nicorette gum occasional  Diet: most home cooked meals, low sodium ,and lots of fish and vegetables; no coffee/tea, only occasional caffeine, rare soda, jsut Sprite; wife pescitarian, pt eats only occasional meats  Exercise: increased physical activity (walking) in last month but limited due to knee issues. Trying to increase slowly  Home BP readings: 11 readings since 01/27/2017; 126/72 average (range 113-145/63-80); pulse 68-79  Wt Readings from Last 3 Encounters:  01/27/17 284 lb (128.8 kg)  12/09/16 282 lb (127.9 kg)  12/02/16 282 lb 8 oz (128.1 kg)   BP Readings from Last 3 Encounters:  03/02/17 118/70  01/27/17 122/80  12/30/16 (!) 148/86   Pulse Readings from Last 3 Encounters:  03/02/17 68  01/27/17 72  12/30/16 76    Past Medical History:  Diagnosis Date  . Arthritis    osteoarthritis left knee  . BPH  (benign prostatic hypertrophy)   . Cholelithiasis   . Chronic atrial fibrillation ([place])   . Colon polyps    adenomatous  . Diabetes mellitus   . Diverticulosis   . Dysrhythmia    tx. Pradaxa- Dr. Martinique follows  . Elevated LFTs   . GERD (gastroesophageal reflux disease)   . Hemorrhoids   . Hyperlipidemia   . Hypertension   . Morbid obesity ([place])   . Sleep apnea    CPAP nightly  . Tubular adenoma of colon     Current Outpatient Prescriptions on File Prior to Visit  Medication Sig Dispense Refill  . acetaminophen (TYLENOL) 500 MG tablet Take 500-1,000 mg by mouth every 6 (six) hours as needed for mild pain.     Marland Kitchen albuterol (PROVENTIL HFA;VENTOLIN HFA) 108 (90 BASE) MCG/ACT inhaler Inhale 2 puffs into the lungs every 6 (six) hours as needed for wheezing or shortness of breath. 1 Inhaler 2  . amLODipine (NORVASC) 10 MG tablet Take 10 mg by mouth every morning.     Marland Kitchen BYSTOLIC 10 MG tablet Take 40 mg by mouth every morning.   2  . dabigatran (PRADAXA) 150 MG CAPS capsule Take 1 capsule (150 mg total) by mouth every 12 (twelve) hours. 180 capsule 0  . doxycycline (MONODOX) 100 MG capsule Take  3 capsules by mouth daily.     Marland Kitchen ezetimibe-simvastatin (VYTORIN) 10-20 MG per tablet Take 1 tablet by mouth every morning.     . fluticasone (FLONASE) 50 MCG/ACT nasal spray Place 2 sprays into both nostrils daily as needed.  11  . furosemide (LASIX) 40 MG tablet Take 1 tablet (40 mg total) by mouth daily. 30 tablet 5  . hydrALAZINE (APRESOLINE) 100 MG tablet Take 1 tablet (100 mg total) by mouth 3 (three) times daily. 90 tablet 3  . linagliptin (TRADJENTA) 5 MG TABS tablet Take 5 mg by mouth every morning.    . metFORMIN (GLUCOPHAGE) 1000 MG tablet Take 1 tablet by mouth 2 (two) times daily with a meal.     . oxyCODONE-acetaminophen (PERCOCET) 10-325 MG per tablet Take 1 tablet by mouth every 4 (four) hours as needed for pain. 60 tablet 0  . sitaGLIPtin (JANUVIA) 50 MG tablet Take 50 mg by mouth  daily.    Marland Kitchen spironolactone (ALDACTONE) 25 MG tablet Take 1 tablet (25 mg total) by mouth daily. 30 tablet 6  . valsartan (DIOVAN) 160 MG tablet Take 1 tablet (160 mg total) by mouth daily. 30 tablet 11   No current facility-administered medications on file prior to visit.     Allergies  Allergen Reactions  . Macadamia Nut Oil Shortness Of Breath    Blood pressure 118/70, pulse 68, SpO2 97 %.  Essential hypertension: Blood pressure at goal today in office as well as at home. No ADRs or problems noted with current therapy. Patient increased physical activity and expressed feeling well.  Will continue current therapy without changes and follow up as needed.  Patient was encouraged to call if blood pressure drops below 90 systolic of if any problems or questions related to his blood pressure.  Richardson Dubree Rodriguez-Guzman PharmD, [place] [id number] 03/02/2017 3:25 PM

## 2017-04-17 ENCOUNTER — Telehealth: Payer: Self-pay | Admitting: Cardiology

## 2017-04-17 NOTE — Telephone Encounter (Signed)
Discussed recall. As instructed by our clinic pharmD, advised pt to contact pharmacy for further information - pt expressed understanding and thanks.

## 2017-04-17 NOTE — Telephone Encounter (Signed)
New emssage  Pt call requesting to speak with RN valsartan 141m. Pt states it is being recalled by the FDA. Pt would like to speak with RN. Please call back to discuss

## 2017-04-21 ENCOUNTER — Telehealth: Payer: Self-pay | Admitting: Cardiology

## 2017-04-21 NOTE — Telephone Encounter (Signed)
New message    Pt c/o medication issue:  1. Name of Medication:  Valsartan   2. How are you currently taking this medication (dosage and times per day)?  1x a day 174m  3. Are you having a reaction (difficulty breathing--STAT)? no  4. What is your medication issue? Pharmacist told him the manufacturer SAlene Mireshas been recalled, if you would call in for a different manufacture they will fill it

## 2017-04-21 NOTE — Telephone Encounter (Signed)
Returned call to patient no answer.LMTC. 

## 2017-04-24 MED ORDER — IRBESARTAN 150 MG PO TABS: 150.0000 mg | ORAL_TABLET | Freq: Every day | ORAL | 6 refills | Status: DC

## 2017-04-24 NOTE — Telephone Encounter (Signed)
Will change to Irbesartan 123m,[facility] patient has home BP cuff, will tale BP @ home every 2-3 days for 2 weeks to ensure no significant changes in BP. SHe will call with any BP changes or s/e for further directions Refill sent as requested

## 2017-04-24 NOTE — Telephone Encounter (Signed)
New Message     Call prescription into [facility] on [address] or [address]

## 2017-05-21 ENCOUNTER — Other Ambulatory Visit: Payer: Self-pay | Admitting: Physician Assistant

## 2017-05-22 NOTE — Telephone Encounter (Signed)
Refill Request.  

## 2017-05-23 ENCOUNTER — Other Ambulatory Visit: Payer: Self-pay

## 2017-05-23 MED ORDER — HYDRALAZINE HCL 100 MG PO TABS: 100.0000 mg | ORAL_TABLET | Freq: Three times a day (TID) | ORAL | 1 refills | Status: DC

## 2017-07-10 ENCOUNTER — Other Ambulatory Visit: Payer: Self-pay

## 2017-07-10 MED ORDER — SPIRONOLACTONE 25 MG PO TABS: 25.0000 mg | ORAL_TABLET | Freq: Every day | ORAL | 6 refills | Status: DC

## 2017-10-04 ENCOUNTER — Ambulatory Visit: Payer: 59 | Admitting: Internal Medicine

## 2017-10-21 IMAGING — US US RENAL ARTERY STENOSIS
1 series · 13 of 25 positions shown · non-contrast
Comparison: CT abdomen pelvis - 02/02/2008

CLINICAL DATA: History of diabetes, now with hypertension. Evaluate
renal artery stenosis.

EXAM:
RENAL/URINARY TRACT ULTRASOUND
RENAL DUPLEX DOPPLER ULTRASOUND

[Series 1: us renal artery stenosis · 0.31mm/px · 13 of 75 slices shown]
[im 1/75]
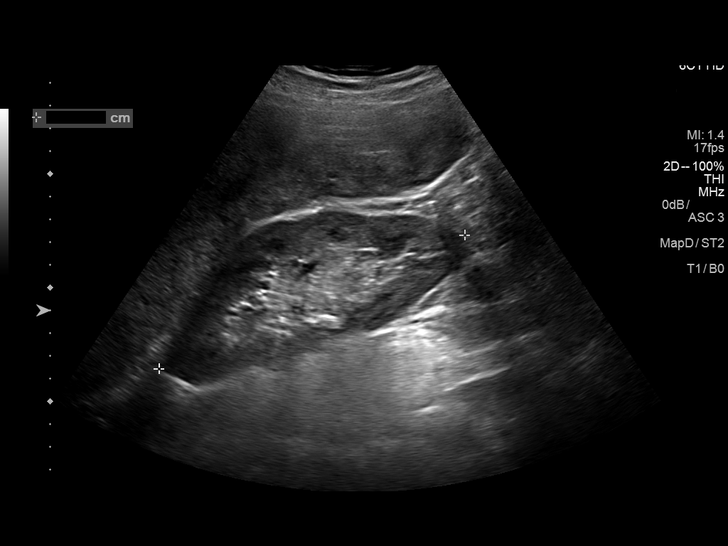
[im 7/75]
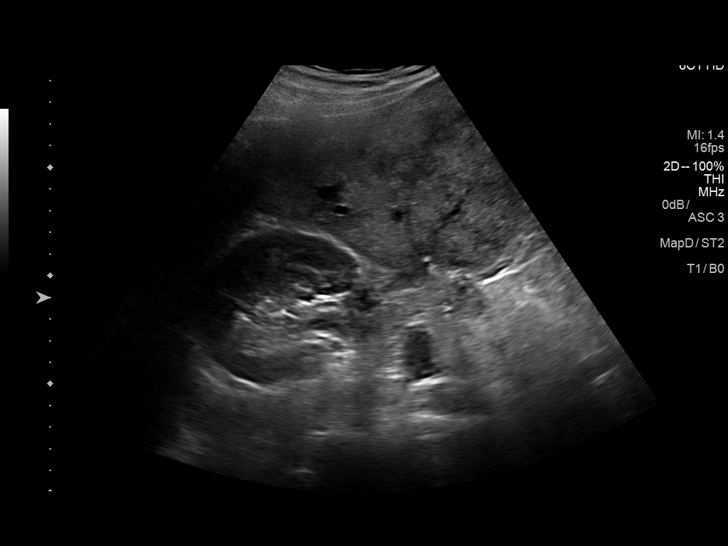
[im 13/75]
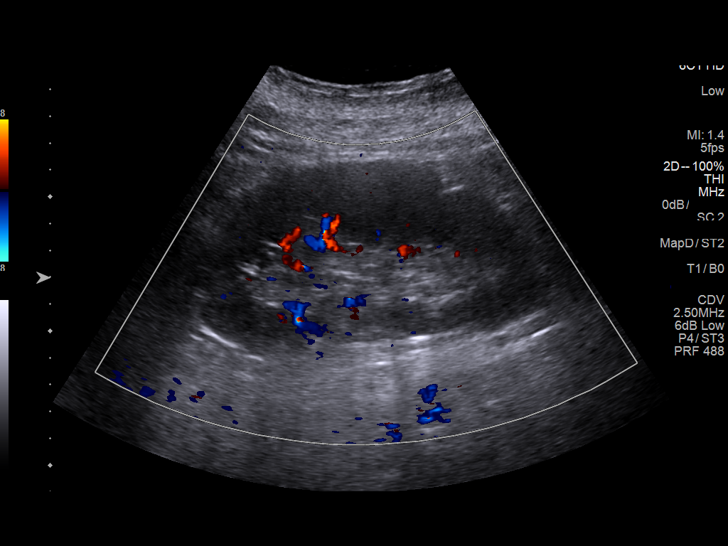
[im 19/75]
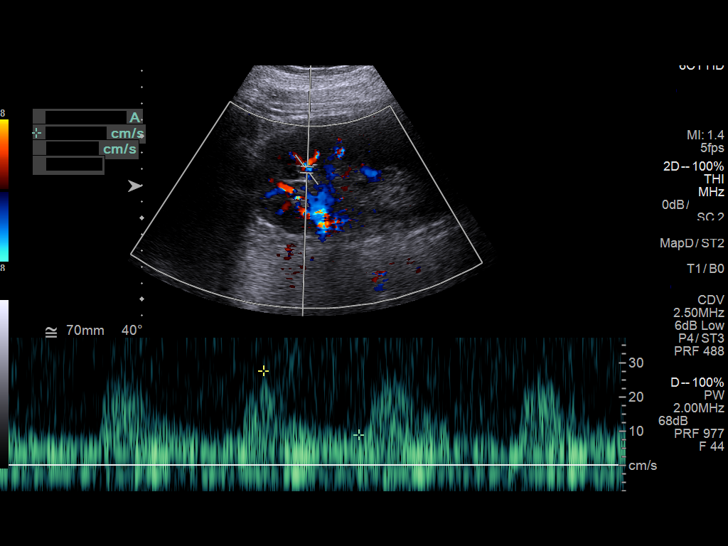
[im 25/75]
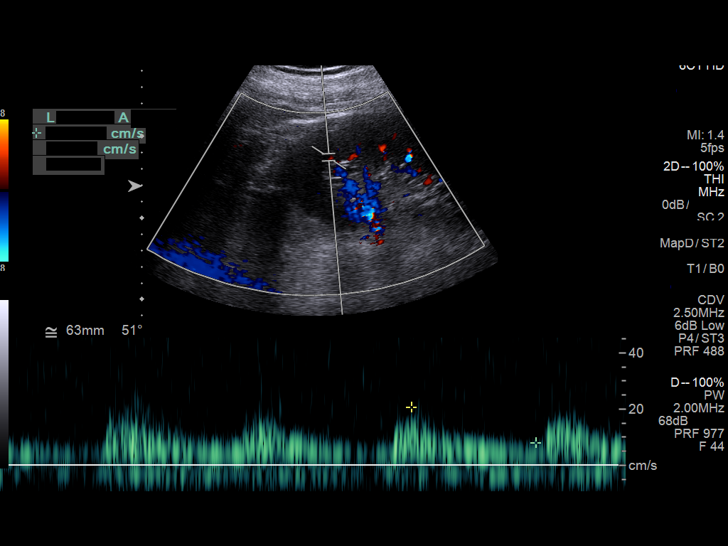
[im 31/75]
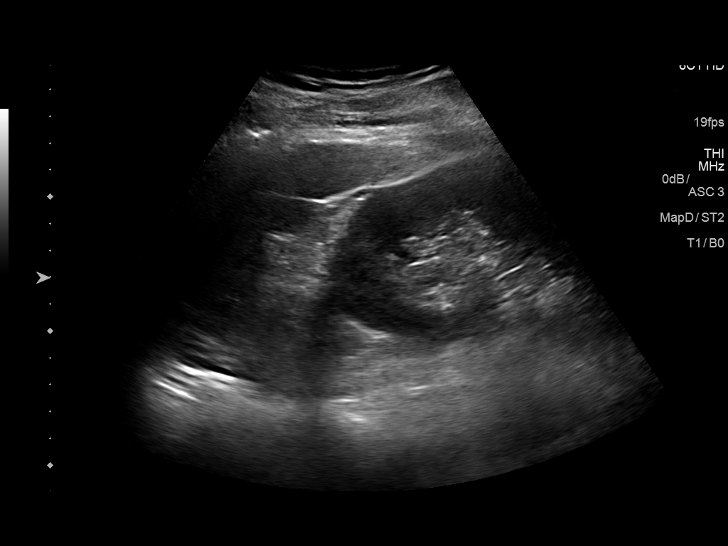
[im 38/75]
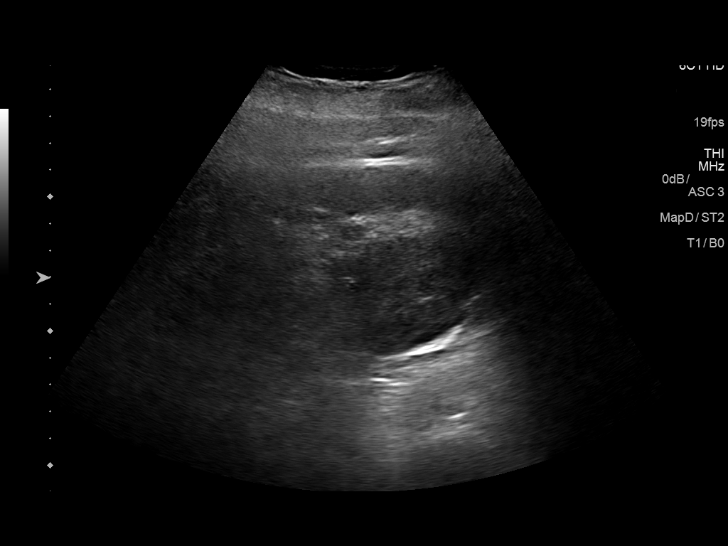
[im 44/75]
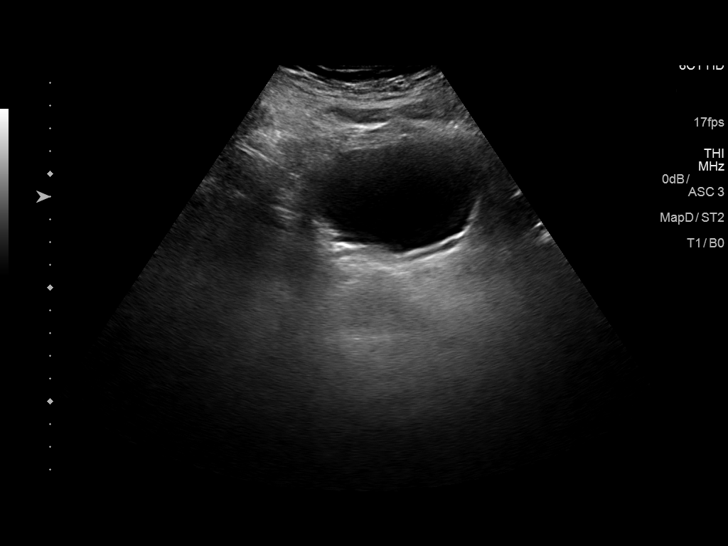
[im 50/75]
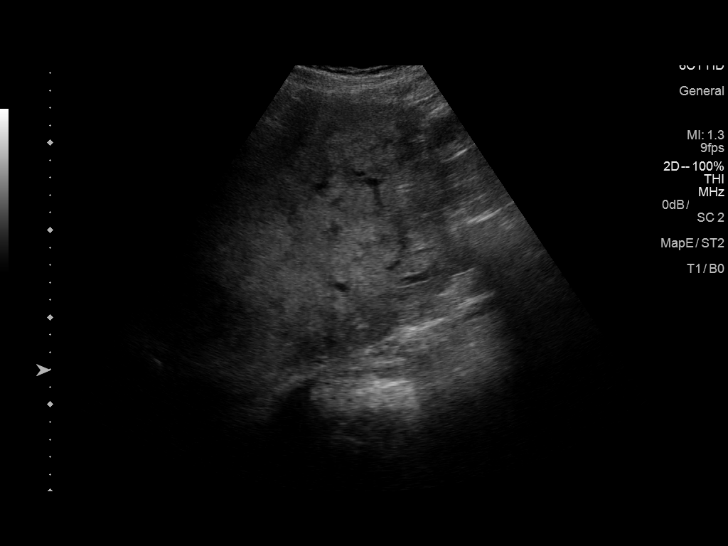
[im 56/75]
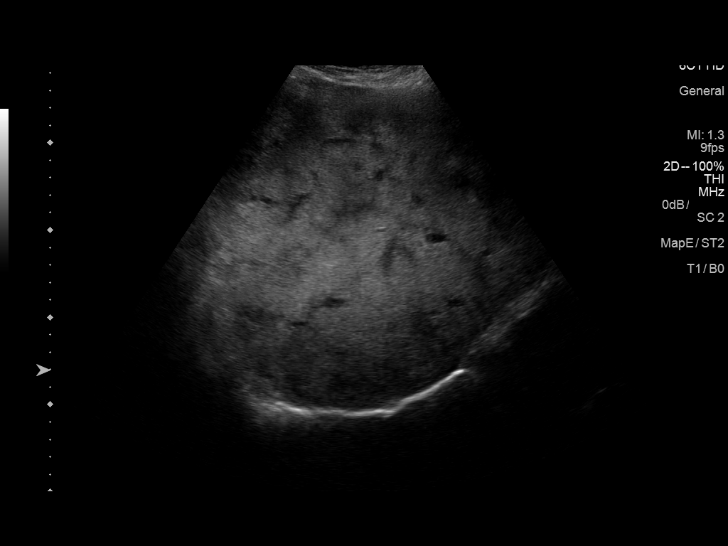
[im 62/75]
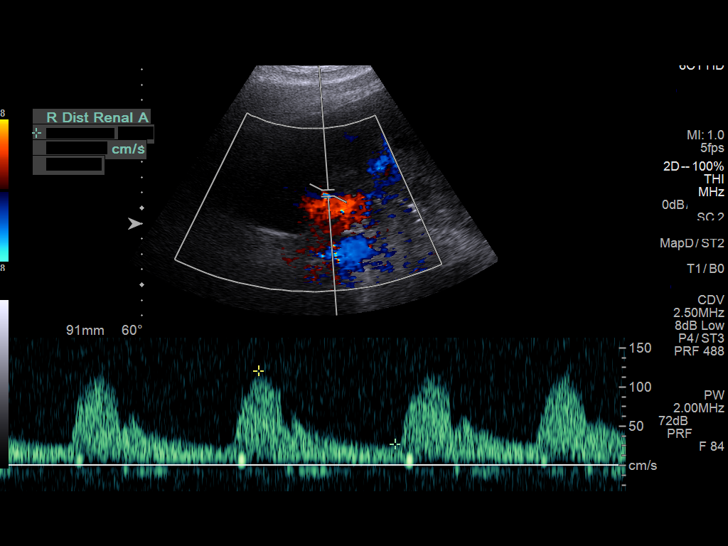
[im 68/75]
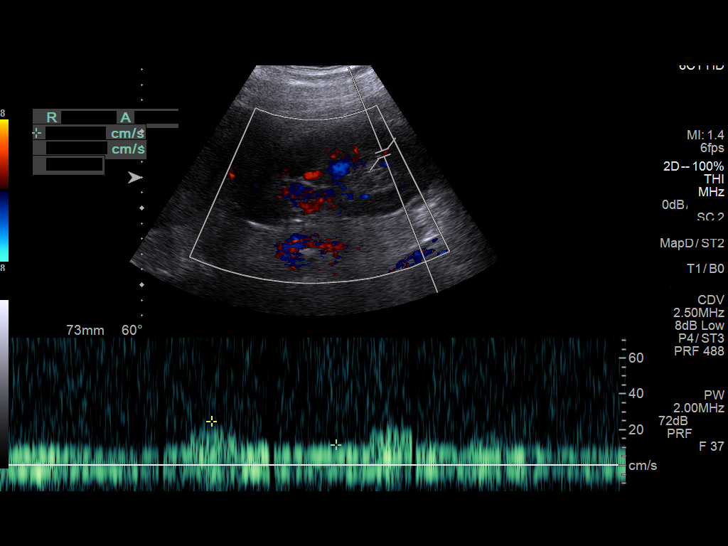
[im 75/75]
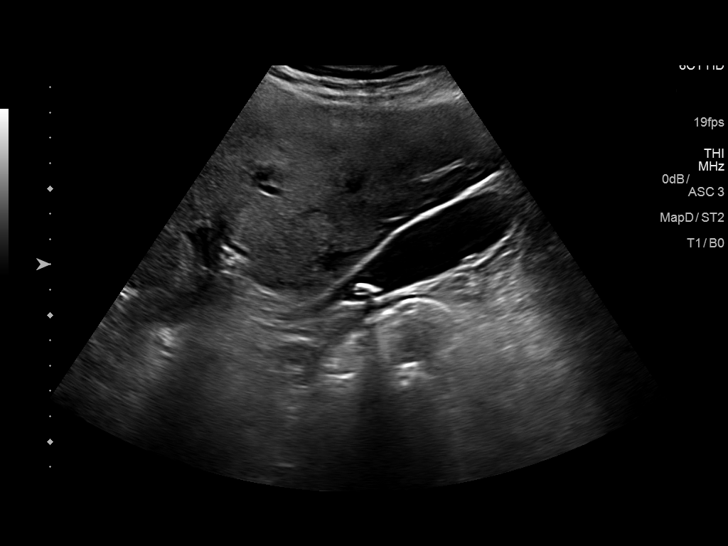

[13 of 25 positions shown; findings below may reference images not displayed]

FINDINGS: Right Kidney:

Normal cortical thickness, echogenicity and size, measuring 14.7 cm
in length. No focal renal lesions. No echogenic renal stones. No
urinary obstruction.

Left Kidney:

Normal cortical thickness, echogenicity and size, measuring 13.7 cm
in length. No focal renal lesions. No echogenic renal stones. No
urinary obstruction.

Bladder:  Appears normal given degree distention.

RENAL DUPLEX ULTRASOUND

Right Renal Artery Velocities:

Origin:  126 cm/sec

Mid:  98 cm/sec

Hilum:  121 cm/sec

Interlobar:  44 cm/sec

Arcuate:  21 Cm/sec

Left Renal Artery Velocities:

Origin:  73 cm/sec

Mid:  92 cm/sec

Hilum:  84 cm/sec

Interlobar:  31 cm/sec

Arcuate:  17 cm/sec

Aortic Velocity:  103 Cm/sec

Right Renal-Aortic Ratios:

Origin:

Mid:

Hilum:

Interlobar:

Arcuate:

Left Renal-Aortic Ratios:

Origin:

Mid:

Hilum:

Interlobar:

Arcuate:

Normal arterial velocities and waveforms are demonstrated throughout
the bilateral renal arteries and renal parenchyma.

The bilateral renal veins appear patent.

The liver is heterogeneous in appearance. There is nodularity of the
hepatic contour with trace amount of perihepatic ascites
(representative images 9 and 10). Note is made of a approximately
1.1 cm partially shadowing stone within neck and otherwise
normal-appearing gallbladder (77).
IMPRESSION: 1. Normal renal ultrasound. No evidence of urinary obstruction,
renal atrophy or renal artery stenosis.
2. Cholelithiasis without evidence of cholecystitis.
3. **An incidental finding of potential clinical significance has
been found. Heterogeneous and nodular appearing liver with trace
amount of perihepatic ascites, findings worrisome for cirrhosis.
Further evaluation of LFTs is recommended. Further evaluation with
abdominal MRI could be performed as clinically indicated. **
These results will be called to the ordering clinician or
representative by the Radiologist Assistant, and communication
documented in the PACS or zVision Dashboard.

## 2017-10-24 DIAGNOSIS — M1990 Unspecified osteoarthritis, unspecified site: Secondary | ICD-10-CM | POA: Insufficient documentation

## 2017-10-24 DIAGNOSIS — Z96659 Presence of unspecified artificial knee joint: Secondary | ICD-10-CM | POA: Insufficient documentation

## 2017-10-26 ENCOUNTER — Telehealth: Payer: Self-pay | Admitting: Cardiology

## 2017-10-26 NOTE — Telephone Encounter (Signed)
Pt c/o medication issue:  1. Name of Medication: Losartan 150 mg    2. How are you currently taking this medication (dosage and times per day)?150 mg once a day in the morning   3. Are you having a reaction (difficulty breathing--STAT)? No  4. What is your medication issue?Recall

## 2017-10-26 NOTE — Telephone Encounter (Signed)
Spoke with patient and advised to reach out to pharmacy in which Losartan was filled as only certain lot number/manufacturer have been recalled. Patient verbalized understanding

## 2017-10-26 NOTE — Telephone Encounter (Signed)
Pt calling stating that his pharmacist instructed him to call Dr. Martinique about his medication Irbesartan 150 mg tablet, stating that the medication has been recalled and wanting to know what Dr. Martinique will replace for this medication. Pt is wanting for a call back at [phone]. Please address

## 2017-10-27 MED ORDER — LOSARTAN POTASSIUM 100 MG PO TABS: 100.0000 mg | ORAL_TABLET | Freq: Every day | ORAL | 6 refills | Status: DC

## 2017-10-27 NOTE — Telephone Encounter (Signed)
He can switch to losartan 100 mg daily instead of Irbesartan  Shaunte Tuft Martinique MD, [facility]

## 2017-10-27 NOTE — Telephone Encounter (Signed)
Spoke to patient Dr.Jordan's recommendations given.Losartan prescription sent to pharmacy.

## 2017-11-09 ENCOUNTER — Encounter: Payer: Self-pay | Admitting: Internal Medicine

## 2017-11-09 ENCOUNTER — Ambulatory Visit: Payer: 59 | Admitting: Internal Medicine

## 2017-11-09 ENCOUNTER — Telehealth: Payer: Self-pay

## 2017-11-09 VITALS — BP 116/80 | HR 80 | Ht 71.75 in | Wt 291.2 lb

## 2017-11-09 DIAGNOSIS — Z7901 Long term (current) use of anticoagulants: Secondary | ICD-10-CM

## 2017-11-09 DIAGNOSIS — R195 Other fecal abnormalities: Secondary | ICD-10-CM | POA: Diagnosis not present

## 2017-11-09 DIAGNOSIS — K2100 Gastro-esophageal reflux disease with esophagitis, without bleeding: Secondary | ICD-10-CM

## 2017-11-09 DIAGNOSIS — K7460 Unspecified cirrhosis of liver: Secondary | ICD-10-CM | POA: Diagnosis not present

## 2017-11-09 DIAGNOSIS — K7581 Nonalcoholic steatohepatitis (NASH): Secondary | ICD-10-CM

## 2017-11-09 MED ORDER — NA SULFATE-K SULFATE-MG SULF 17.5-3.13-1.6 GM/177ML PO SOLN: 1.0000 | Freq: Once | ORAL | 0 refills | Status: AC

## 2017-11-09 MED ORDER — OMEPRAZOLE 40 MG PO CPDR: 40.0000 mg | DELAYED_RELEASE_CAPSULE | Freq: Every day | ORAL | 11 refills | Status: DC

## 2017-11-09 NOTE — Telephone Encounter (Signed)
   Primary Cardiologist: No primary care provider on file.  Chart reviewed as part of pre-operative protocol coverage. Given past medical history, my conversation with the patient today, and time since last visit, based on ACC/AHA guidelines, Dan [name] would be at acceptable risk for the planned procedure without further cardiovascular testing.   I will forward this pre op request to the pharmacist about holding his Pradaxa pre op (endoscopy).  Please call with questions.  Kerin Ransom, PA-C 11/09/2017, 4:15 PM

## 2017-11-09 NOTE — Patient Instructions (Signed)
We have sent the following medications to your pharmacy for you to pick up at your convenience:  Omeprazole  You have been scheduled for an endoscopy and colonoscopy. Please follow the written instructions given to you at your visit today. Please pick up your prep supplies at the pharmacy within the next 1-3 days. If you use inhalers (even only as needed), please bring them with you on the day of your procedure. Your physician has requested that you go to www.startemmi.com and enter the access code given to you at your visit today. This web site gives a general overview about your procedure. However, you should still follow specific instructions given to you by our office regarding your preparation for the procedure.   

## 2017-11-09 NOTE — Telephone Encounter (Signed)
  11/09/2017   RE: Dan [name] DOB: [birth year] MRN: [id number]   Dear Dr. Martinique,    We have scheduled the above patient for an endoscopic procedure. Our records show that he is on anticoagulation therapy.   Please advise as to how long the patient may come off his therapy of Pradaxa prior to the procedure, which is scheduled for 11/15/2017.  Please fax back/ or route the completed form to [place] at [phone].   Sincerely,    Phillis Haggis

## 2017-11-10 ENCOUNTER — Encounter: Payer: Self-pay | Admitting: Internal Medicine

## 2017-11-10 ENCOUNTER — Telehealth: Payer: Self-pay

## 2017-11-10 NOTE — Telephone Encounter (Signed)
Pt takes Pradaxa for afib with CHADS2VASc score of 3 (age, HTN, DM). Renal function is normal. Ok to hold Pradaxa for 2 days prior to procedure.

## 2017-11-10 NOTE — Progress Notes (Signed)
HISTORY OF PRESENT ILLNESS:  Dan [name] is a 67 y.o. male with multiple significant medical problems including chronic atrial fibrillation on Pradaxa, morbid obesity, diabetes mellitus, obstructive sleep apnea with CPAP at night, and Dan [name] cirrhosis for which she was evaluated last in this office 01/27/2017. The patient is sent today by his primary care provider Dr. Reynaldo Minium regarding Hemoccult-positive stool on outpatient testing. Review of outside laboratories shows Hemoccult-positive stool on October 2 and again on 07/17/2017. Review of blood work from September 2018 reveals normal hemoglobin of 15.2. Will platelets 199,000. Normal liver tests except for bilirubin of 1.8. Recent hemoglobin from March 2018 was 15.0. Patient tells me that he does see occasional blood with hemorrhoids. Otherwise his GI review of systems is negative entirely except for chronic heartburn and indigestion for which she is on no therapy. The patient has undergone multiple prior colonoscopies. Most recently March 2016 with moderate left-sided diverticulosis and a diminutive tubular adenoma. Follow-up in 5 years recommended. Mother with colon cancer at advanced age. The patient also underwent upper endoscopy in March 2016. She was found to have distal esophagitis. No varices. I prescribed omeprazole 20 mg daily. He is not taking this. He does have significant reflux symptoms but no dysphagia.  REVIEW OF SYSTEMS:  All non-GI ROS negative unless otherwise stated in the history of present illness except for arthritis, swelling of legs  Past Medical History:  Diagnosis Date  . Arthritis    osteoarthritis left knee  . BPH (benign prostatic hypertrophy)   . Cholelithiasis   . Chronic atrial fibrillation ([place])   . Colon polyps    adenomatous  . Diabetes mellitus   . Diverticulosis   . Dysrhythmia    tx. Pradaxa- Dr. Martinique follows  . Elevated LFTs   . GERD (gastroesophageal reflux disease)   . Hemorrhoids   .  Hyperlipidemia   . Hypertension   . Morbid obesity ([place])   . Sleep apnea    CPAP nightly  . Tubular adenoma of colon     Past Surgical History:  Procedure Laterality Date  . KNEE ARTHROSCOPY Left 03/28/2014   Procedure: LEFT KNEE ARTHROSCOPY WITH DEBRIDEMENT  ;  Surgeon: Johnn Hai, MD;  [facility];  Service: Orthopedics;  Laterality: Left;  . TONSILLECTOMY    . TOTAL KNEE ARTHROPLASTY Left 04/30/2015   Procedure: LEFT TOTAL KNEE ARTHROPLASTY;  Surgeon: Susa Day, MD;  Location: [facility];  Service: Orthopedics;  Laterality: Left;  . US ECHOCARDIOGRAPHY  08/08/2006   ef 55-60%    Social History Dan [name]  reports that he quit smoking about 26 years ago. He has quit using smokeless tobacco. He reports that he drinks alcohol. He reports that he does not use drugs.  family history includes Breast cancer in his sister; Colon cancer in his mother; Heart attack in his father.  Allergies  Allergen Reactions  . Macadamia Nut Oil Shortness Of Breath       PHYSICAL EXAMINATION: Vital signs: BP 116/80 (BP Location: Left Arm, Patient Position: Sitting, Cuff Size: Normal)   Pulse 80 Comment: irregular  Ht 5' 11.75" (1.822 m)   Wt 291 lb 4 oz (132.1 kg)   BMI 39.78 kg/m   Constitutional: Markedly obese and unhealthy appearing, no acute distress Psychiatric: alert and oriented x3, cooperative Eyes: extraocular movements intact, anicteric, conjunctiva pink Mouth: oral pharynx moist, no lesions Neck: supple no lymphadenopathy Cardiovascular: heart regular rate and rhythm with intermittent irregular beat, no murmur Lungs: clear  to auscultation bilaterally Abdomen: soft, obese, nontender, nondistended, no obvious ascites, no peritoneal signs, normal bowel sounds, no organomegaly Rectal: Deferred until colonoscopy Extremities: no clubbing or cyanosis. 1+ lower extremity edema bilaterally with chronic stasis changes to above the mid shins Skin: no  additional lesions on visible extremities Neuro: No focal deficits. Cranial nerves intact. No asterixis.   ASSESSMENT:  #1. Hemoccult-positive stool. Rule out underlying GI mucosal lesion #2. Chronic active GERD with prior endoscopic evidence of esophagitis. Ongoing #3. History of adenomatous colon polyps and family history of colon cancer. Last colonoscopy 2016 #4. Multiple medical problems including morbid obesity with sleep apnea, diabetes mellitus, and atrial fibrillation on Pradaxa  PLAN:  #1. Upper endoscopy to evaluate Hemoccult-positive stool and screen for esophageal varices. The patient is HIGH RISK given his body habitus, comorbidities, and the need to adjust both diabetic and anticoagulation medications.The nature of the procedure, as well as the risks, benefits, and alternatives were carefully and thoroughly reviewed with the patient. Ample time for discussion and questions allowed. The patient understood, was satisfied, and agreed to proceed. #2. Colonoscopy to evaluate Hemoccult-positive stool. Patient is HIGH RISK as stated above.The nature of the procedure, as well as the risks, benefits, and alternatives were carefully and thoroughly reviewed with the patient. Ample time for discussion and questions allowed. The patient understood, was satisfied, and agreed to proceed. #3. Prescribe omeprazole 40 mg daily for chronic GERD #4. Reflux precautions with attention to weight loss #5. Hold diabetic medications the day of the examinations in order to avoid on wanted hypoglycemia. He has been instructed #6. Hold Pradaxa 3 days prior to the procedures. Confirm with his cardiologist the acceptability. We did this previously for his procedures in 2016. He has been instructed  A copy of this consultation note has been sent to Dr. Reynaldo Minium

## 2017-11-10 NOTE — Telephone Encounter (Signed)
Spoke with patient and clarified that he can hold his Pradaxa for 2 days prior to his procedure.  Patient agreed.

## 2017-11-15 ENCOUNTER — Ambulatory Visit (AMBULATORY_SURGERY_CENTER): Payer: 59 | Admitting: Internal Medicine

## 2017-11-15 ENCOUNTER — Encounter: Payer: Self-pay | Admitting: Internal Medicine

## 2017-11-15 ENCOUNTER — Other Ambulatory Visit: Payer: Self-pay

## 2017-11-15 VITALS — BP 109/68 | HR 76 | Temp 96.8°F | Resp 11 | Ht 71.0 in | Wt 291.0 lb

## 2017-11-15 DIAGNOSIS — D123 Benign neoplasm of transverse colon: Secondary | ICD-10-CM

## 2017-11-15 DIAGNOSIS — R195 Other fecal abnormalities: Secondary | ICD-10-CM

## 2017-11-15 DIAGNOSIS — D124 Benign neoplasm of descending colon: Secondary | ICD-10-CM | POA: Diagnosis not present

## 2017-11-15 DIAGNOSIS — Z86010 Personal history of colonic polyps: Secondary | ICD-10-CM | POA: Diagnosis not present

## 2017-11-15 DIAGNOSIS — Z800 Family history of malignant neoplasm of digestive organs: Secondary | ICD-10-CM | POA: Diagnosis not present

## 2017-11-15 DIAGNOSIS — K2100 Gastro-esophageal reflux disease with esophagitis, without bleeding: Secondary | ICD-10-CM

## 2017-11-15 DIAGNOSIS — D122 Benign neoplasm of ascending colon: Secondary | ICD-10-CM

## 2017-11-15 MED ORDER — SODIUM CHLORIDE 0.9 % IV SOLN: 500.0000 mL | Freq: Once | INTRAVENOUS | Status: DC

## 2017-11-15 NOTE — Telephone Encounter (Signed)
Erroneous encounter

## 2017-11-15 NOTE — Progress Notes (Signed)
To recovery, report to RN, VSS. 

## 2017-11-15 NOTE — Op Note (Signed)
[facility] Patient Name: Dan [name] Procedure Date: 11/15/2017 3:51 PM MRN: [id number] Endoscopist: [name] , MD Age: 67 Referring MD:  Date of Birth: 1951-05-07 Gender: Male Account #: [id number] Procedure:                Upper GI endoscopy Indications:              Heme positive stool. History of cirrhosis Medicines:                Monitored Anesthesia Care Procedure:                Pre-Anesthesia Assessment:                           - Prior to the procedure, a History and Physical                            was performed, and patient medications and                            allergies were reviewed. The patient's tolerance of                            previous anesthesia was also reviewed. The risks                            and benefits of the procedure and the sedation                            options and risks were discussed with the patient.                            All questions were answered, and informed consent                            was obtained. Prior Anticoagulants: The patient has                            taken Pradaxa (dabigatran), last dose was 3 days                            prior to procedure. ASA Grade Assessment: III - A                            patient with severe systemic disease. After                            reviewing the risks and benefits, the patient was                            deemed in satisfactory condition to undergo the                            procedure.  After obtaining informed consent, the endoscope was                            passed under direct vision. Throughout the                            procedure, the patient's blood pressure, pulse, and                            oxygen saturations were monitored continuously. The                            Endoscope was introduced through the mouth, and                            advanced to the second part of duodenum. The  upper                            GI endoscopy was accomplished without difficulty.                            The patient tolerated the procedure well. Scope In: Scope Out: Findings:                 The esophagus was normal. NO VARICES.                           The stomach was normal.                           The examined duodenum was normal.                           The cardia and gastric fundus were normal on                            retroflexion. Complications:            No immediate complications. Estimated Blood Loss:     Estimated blood loss: none. Impression:               - Normal esophagus. NO VARICES.                           - Normal stomach.                           - Normal examined duodenum.                           - No specimens collected. Recommendation:           - Patient has a contact number available for                            emergencies. The signs and symptoms of potential  delayed complications were discussed with the                            patient. Return to normal activities tomorrow.                            Written discharge instructions were provided to the                            patient.                           - Resume previous diet.                           - Continue present medications. [name], MD 11/15/2017 4:29:37 PM This report has been signed electronically.

## 2017-11-15 NOTE — Progress Notes (Signed)
I have reviewed the patient's medical history in detail and updated the computerized patient record.

## 2017-11-15 NOTE — Op Note (Signed)
[facility] Patient Name: Dan [name] Procedure Date: 11/15/2017 3:51 PM MRN: [id number] Endoscopist: [name] , MD Age: 67 Referring MD:  Date of Birth: 12-04-1950 Gender: Male Account #: [id number] Procedure:                Colonoscopy With cold snare polypectomy x 4 Indications:              Heme positive stool. Personal history of                            adenomatous colon polyps. Last colonoscopy 2016.                            Family history of colon cancer. Medicines:                Monitored Anesthesia Care Procedure:                Pre-Anesthesia Assessment:                           - Prior to the procedure, a History and Physical                            was performed, and patient medications and                            allergies were reviewed. The patient's tolerance of                            previous anesthesia was also reviewed. The risks                            and benefits of the procedure and the sedation                            options and risks were discussed with the patient.                            All questions were answered, and informed consent                            was obtained. Prior Anticoagulants: The patient has                            taken Pradaxa (dabigatran), last dose was 3 days                            prior to procedure. ASA Grade Assessment: III - A                            patient with severe systemic disease. After                            reviewing the risks and benefits, the patient was  deemed in satisfactory condition to undergo the                            procedure.                           After obtaining informed consent, the colonoscope                            was passed under direct vision. Throughout the                            procedure, the patient's blood pressure, pulse, and                            oxygen saturations were monitored  continuously. The                            Colonoscope was introduced through the anus and                            advanced to the the cecum, identified by                            appendiceal orifice and ileocecal valve. The                            ileocecal valve, appendiceal orifice, and rectum                            were photographed. The quality of the bowel                            preparation was excellent. The colonoscopy was                            performed without difficulty. The patient tolerated                            the procedure well. The bowel preparation used was                            SUPREP. Scope In: 4:01:35 PM Scope Out: 4:18:22 PM Scope Withdrawal Time: 0 hours 14 minutes 22 seconds  Total Procedure Duration: 0 hours 16 minutes 47 seconds  Findings:                 Four polyps were found in the descending colon,                            transverse colon and ascending colon. The polyps                            were 2 to 3 mm in size. These polyps were removed  with a cold snare. Resection and retrieval were                            complete.                           Multiple diverticula were found in the left colon.                           Internal hemorrhoids were found during retroflexion.                           The exam was otherwise without abnormality on                            direct and retroflexion views. Complications:            No immediate complications. Estimated blood loss:                            None. Estimated Blood Loss:     Estimated blood loss: none. Impression:               - Four 2 to 3 mm polyps in the descending colon, in                            the transverse colon and in the ascending colon,                            removed with a cold snare. Resected and retrieved.                           - Diverticulosis in the left colon.                           - Internal  hemorrhoids.                           - The examination was otherwise normal on direct                            and retroflexion views. Recommendation:           - Repeat colonoscopy in 5 years for surveillance.                           - Resume Pradaxa (dabigatran) today at prior dose.                           - Patient has a contact number available for                            emergencies. The signs and symptoms of potential                            delayed complications were discussed with the  patient. Return to normal activities tomorrow.                            Written discharge instructions were provided to the                            patient.                           - Resume previous diet.                           - Continue present medications.                           - Await pathology results. [name], MD 11/15/2017 4:27:47 PM This report has been signed electronically.

## 2017-11-15 NOTE — Patient Instructions (Signed)
*  Handouts given to patient on polyps*  YOU HAD AN ENDOSCOPIC PROCEDURE TODAY AT [facility]:   Refer to the procedure report that was given to you for any specific questions about what was found during the examination.  If the procedure report does not answer your questions, please call your gastroenterologist to clarify.  If you requested that your care partner not be given the details of your procedure findings, then the procedure report has been included in a sealed envelope for you to review at your convenience later.  YOU SHOULD EXPECT: Some feelings of bloating in the abdomen. Passage of more gas than usual.  Walking can help get rid of the air that was put into your GI tract during the procedure and reduce the bloating. If you had a lower endoscopy (such as a colonoscopy or flexible sigmoidoscopy) you may notice spotting of blood in your stool or on the toilet paper. If you underwent a bowel prep for your procedure, you may not have a normal bowel movement for a few days.  Please Note:  You might notice some irritation and congestion in your nose or some drainage.  This is from the oxygen used during your procedure.  There is no need for concern and it should clear up in a day or so.  SYMPTOMS TO REPORT IMMEDIATELY:   Following lower endoscopy (colonoscopy or flexible sigmoidoscopy):  Excessive amounts of blood in the stool  Significant tenderness or worsening of abdominal pains  Swelling of the abdomen that is new, acute  Fever of 100F or higher   Following upper endoscopy (EGD)  Vomiting of blood or coffee ground material  New chest pain or pain under the shoulder blades  Painful or persistently difficult swallowing  New shortness of breath  Fever of 100F or higher  Black, tarry-looking stools  For urgent or emergent issues, a gastroenterologist can be reached at any hour by calling [phone].   DIET:  We do recommend a small meal at first, but then you  may proceed to your regular diet.  Drink plenty of fluids but you should avoid alcoholic beverages for 24 hours.  ACTIVITY:  You should plan to take it easy for the rest of today and you should NOT DRIVE or use heavy machinery until tomorrow (because of the sedation medicines used during the test).    FOLLOW UP: Our staff will call the number listed on your records the next business day following your procedure to check on you and address any questions or concerns that you may have regarding the information given to you following your procedure. If we do not reach you, we will leave a message.  However, if you are feeling well and you are not experiencing any problems, there is no need to return our call.  We will assume that you have returned to your regular daily activities without incident.  If any biopsies were taken you will be contacted by phone or by letter within the next 1-3 weeks.  Please call us at [phone] if you have not heard about the biopsies in 3 weeks.    SIGNATURES/CONFIDENTIALITY: You and/or your care partner have signed paperwork which will be entered into your electronic medical record.  These signatures attest to the fact that that the information above on your After Visit Summary has been reviewed and is understood.  Full responsibility of the confidentiality of this discharge information lies with you and/or your care-partner.

## 2017-11-15 NOTE — Progress Notes (Signed)
Called to room to assist during endoscopic procedure.  Patient ID and intended procedure confirmed with present staff. Received instructions for my participation in the procedure from the performing physician.  

## 2017-11-16 ENCOUNTER — Telehealth: Payer: Self-pay

## 2017-11-16 NOTE — Telephone Encounter (Signed)
  Follow up Call-  Call back number 11/15/2017  Post procedure Call Back phone  # [phone]  Permission to leave phone message Yes  Some recent data might be hidden     Patient questions:  Do you have a fever, pain , or abdominal swelling? No. Pain Score  0 *  Have you tolerated food without any problems? Yes.    Have you been able to return to your normal activities? Yes.    Do you have any questions about your discharge instructions: Diet   No. Medications  No. Follow up visit  No.  Do you have questions or concerns about your Care? No.  Actions: * If pain score is 4 or above: No action needed, pain <4.

## 2017-11-20 ENCOUNTER — Encounter: Payer: Self-pay | Admitting: Internal Medicine

## 2018-01-01 ENCOUNTER — Other Ambulatory Visit: Payer: Self-pay | Admitting: *Deleted

## 2018-01-01 MED ORDER — HYDRALAZINE HCL 100 MG PO TABS: 100.0000 mg | ORAL_TABLET | Freq: Three times a day (TID) | ORAL | 0 refills | Status: DC

## 2018-02-12 ENCOUNTER — Other Ambulatory Visit: Payer: Self-pay

## 2018-02-12 ENCOUNTER — Telehealth: Payer: Self-pay

## 2018-02-12 NOTE — Telephone Encounter (Signed)
Received refill request for spironolactone 25 mg tab. Based on last AVS 12/09/16 on pt was taking it but it doesn't reflect on medication list. Please advise if its ok to fill.

## 2018-02-13 MED ORDER — SPIRONOLACTONE 25 MG PO TABS: 25.0000 mg | ORAL_TABLET | Freq: Every day | ORAL | 6 refills | Status: DC

## 2018-02-13 NOTE — Telephone Encounter (Signed)
Yes please refill spironolactone.  Peter Martinique MD, [facility]

## 2018-02-13 NOTE — Telephone Encounter (Signed)
Spoke to patient Dr.Jordan's recommendation given.Refill sent to pharmacy.

## 2018-02-23 DIAGNOSIS — M5136 Other intervertebral disc degeneration, lumbar region: Secondary | ICD-10-CM | POA: Insufficient documentation

## 2018-02-23 DIAGNOSIS — R638 Other symptoms and signs concerning food and fluid intake: Secondary | ICD-10-CM | POA: Insufficient documentation

## 2018-02-23 DIAGNOSIS — M545 Low back pain, unspecified: Secondary | ICD-10-CM | POA: Insufficient documentation

## 2018-05-27 IMAGING — CT CT ABD-PELV W/ CM
2 of 5 series · 16 of 46 positions shown, 18 images · IV contrast (ISOVUE 300)
Comparison: Ultrasound November 23, 2016. CT scan February 02, 2008.

CLINICAL DATA: Possible hepatic cirrhosis.

EXAM:
CT ABDOMEN AND PELVIS WITH CONTRAST
TECHNIQUE: Multidetector CT imaging of the abdomen and pelvis was performed
using the standard protocol following bolus administration of
intravenous contrast.
CONTRAST:  100mL VJGOZ6-JAA IOPAMIDOL (VJGOZ6-JAA) INJECTION 61%

[Series 2: abd/pel w · axial · 0.94mm/px · z∈[-518,-93]mm · 13 of 97 slices shown, 15 images]
[im 6/97  soft-tissue]
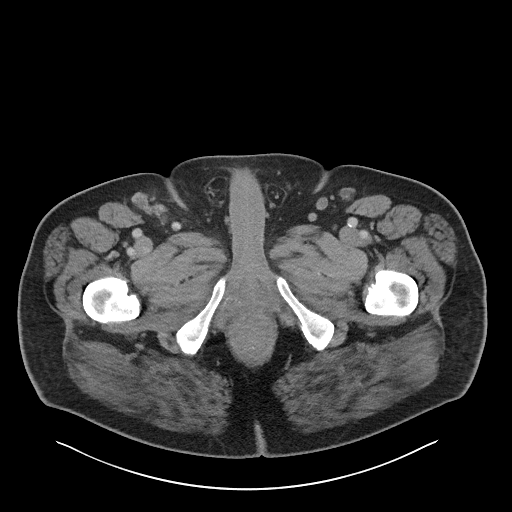
[im 6/97  bone]
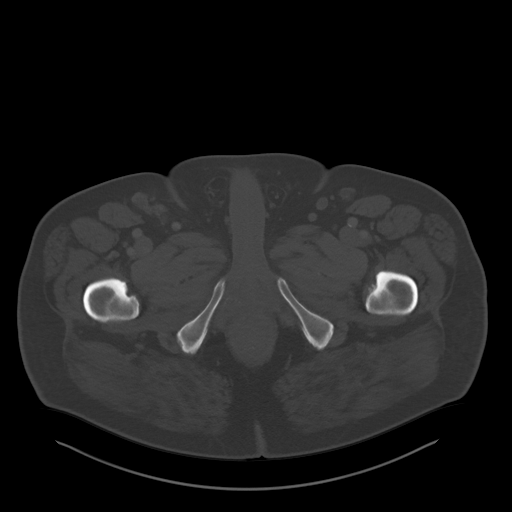
[im 11/97  soft-tissue]
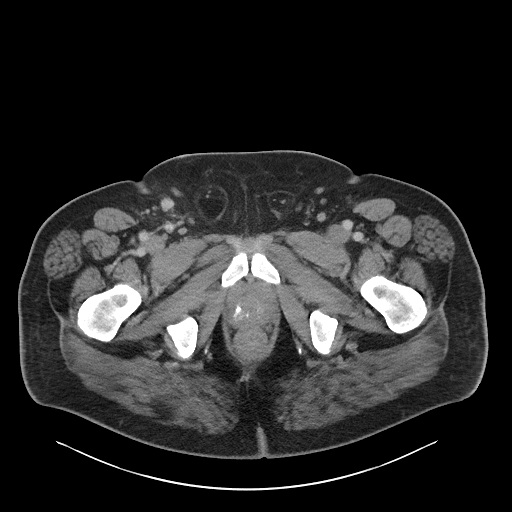
[im 22/97  soft-tissue]
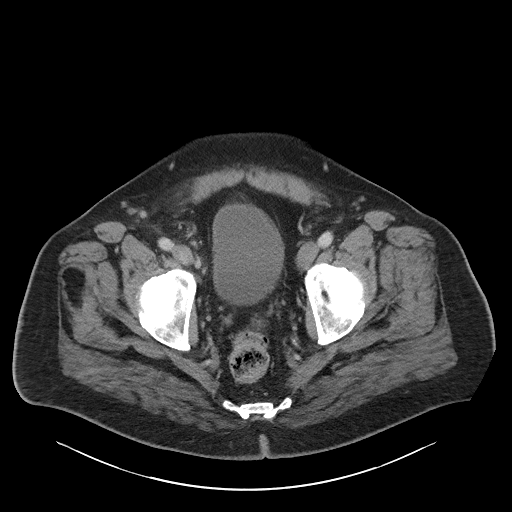
[im 27/97  soft-tissue]
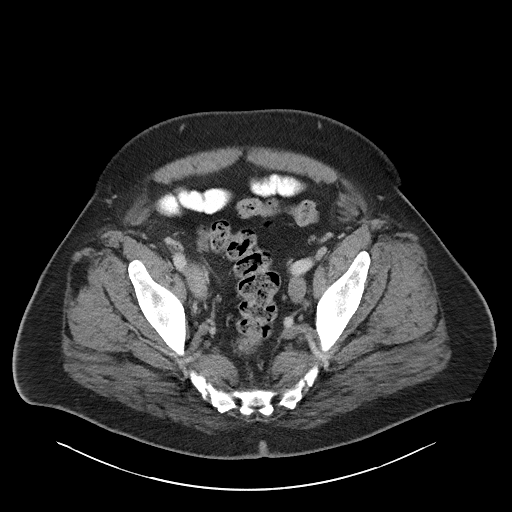
[im 33/97  soft-tissue]
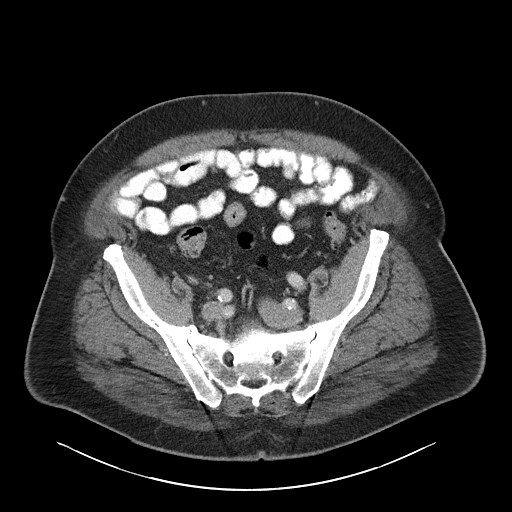
[im 43/97  soft-tissue]
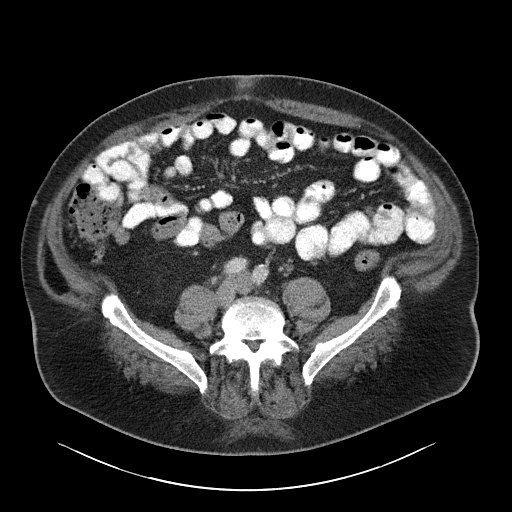
[im 49/97  soft-tissue]
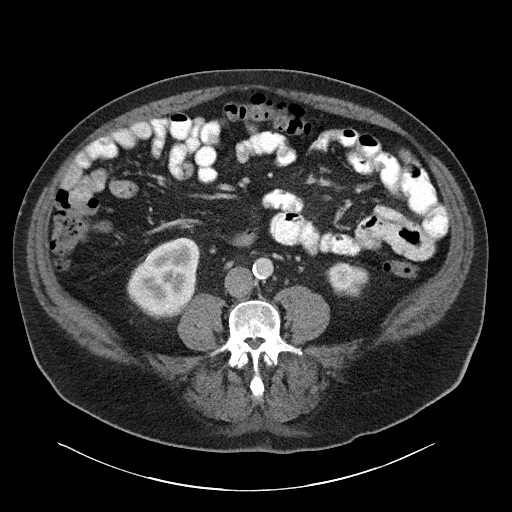
[im 54/97  soft-tissue]
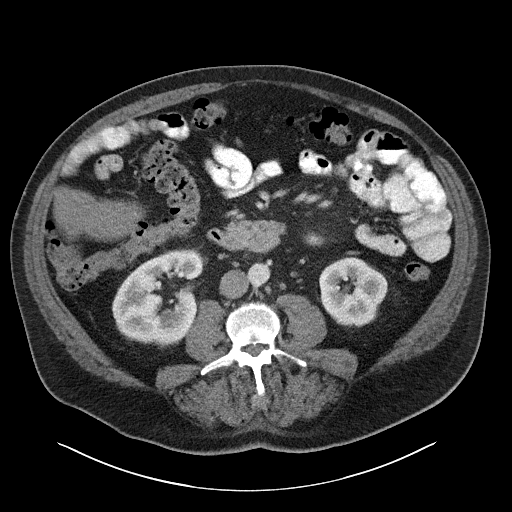
[im 65/97  soft-tissue]
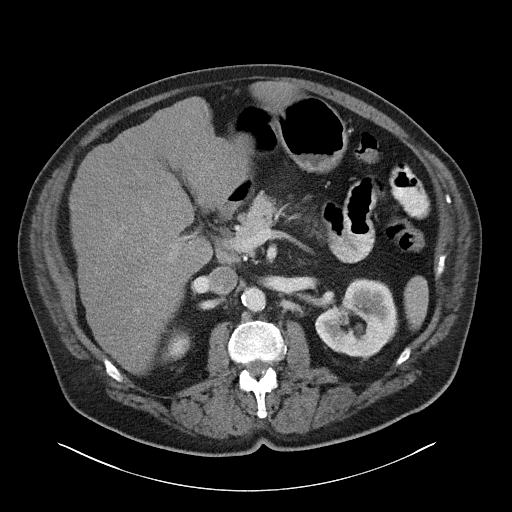
[im 65/97  bone]
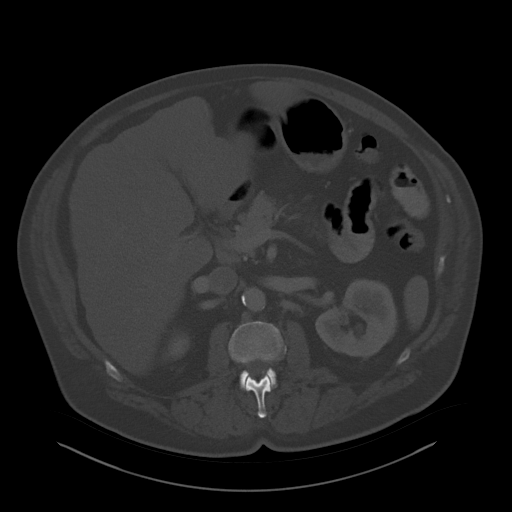
[im 70/97  soft-tissue]
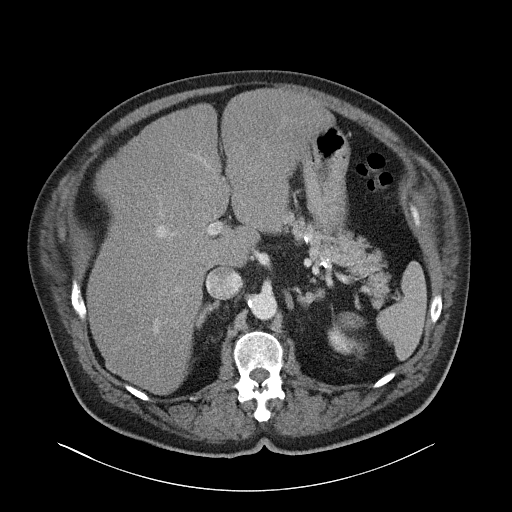
[im 75/97  soft-tissue]
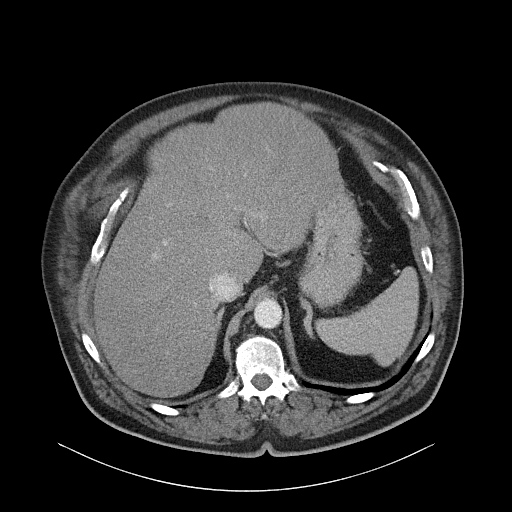
[im 86/97  soft-tissue]
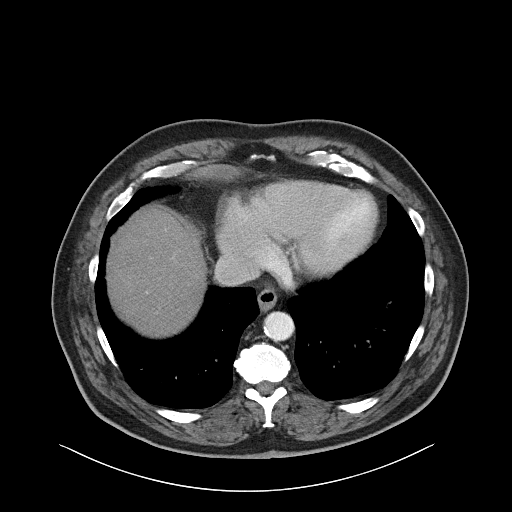
[im 91/97  soft-tissue]
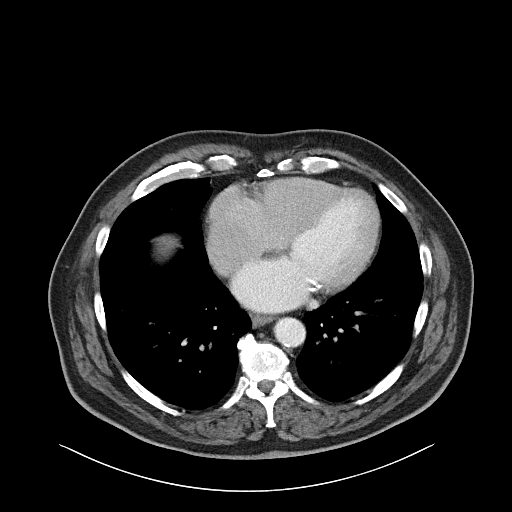

[Series 6: abd/pel w st · coronal · 0.89mm/px · 3 of 107 slices shown]
[im 36/107  soft-tissue]
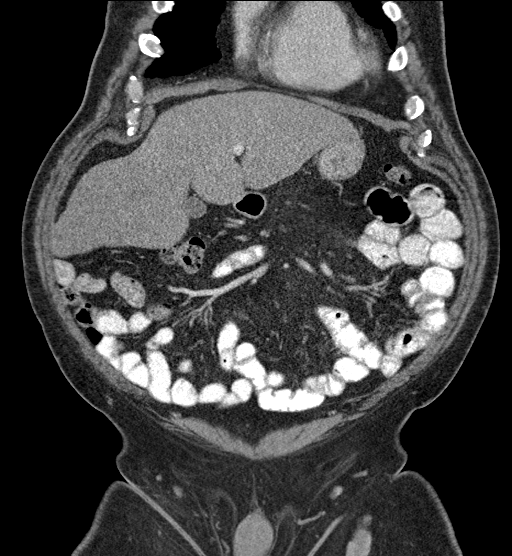
[im 48/107  soft-tissue]
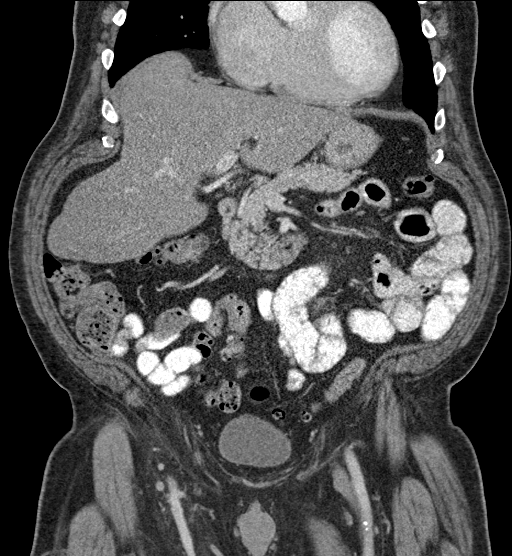
[im 59/107  soft-tissue]
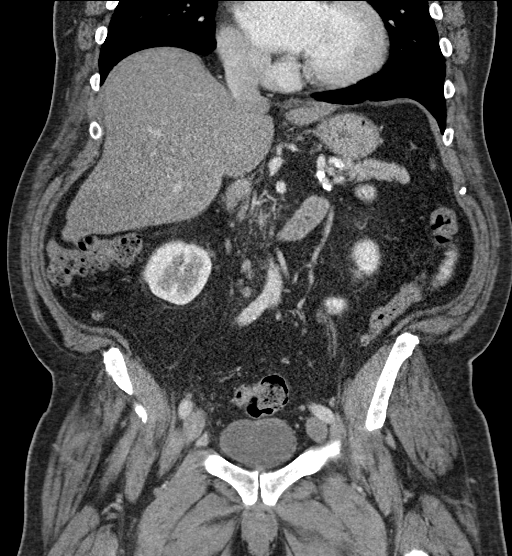

[16 of 46 positions shown; findings below may reference images not displayed]

FINDINGS: Lower chest: No acute abnormality.

Hepatobiliary: Solitary gallstone is seen in neck of gallbladder.
Nodular hepatic contours are noted consistent with hepatic
cirrhosis. No focal lesion is noted within the liver.

Pancreas: Unremarkable. No pancreatic ductal dilatation or
surrounding inflammatory changes.

Spleen: Normal in size without focal abnormality.

Adrenals/Urinary Tract: Adrenal glands are unremarkable. Kidneys are
normal, without renal calculi, focal lesion, or hydronephrosis.
Bladder is unremarkable.

Stomach/Bowel: Stomach is within normal limits. Appendix appears
normal. No evidence of bowel wall thickening, distention, or
inflammatory changes.

Vascular/Lymphatic: Atherosclerosis of abdominal aorta is noted.
Mildly enlarged bilateral inguinal adenopathy is noted which most
likely is reactive or inflammatory in etiology.

Reproductive: Mild prostatic enlargement is noted with associated
calcifications.

Other: Mild bilateral fat containing inguinal hernias are noted.

Musculoskeletal: No acute or significant osseous findings.
IMPRESSION: Solitary gallstone.

Nodular hepatic contours consistent with hepatic cirrhosis.

Aortic atherosclerosis.

Mildly enlarged bilateral inguinal adenopathy is noted which may be
reactive or inflammatory in etiology.

Mild bilateral fat containing inguinal hernias.

## 2018-05-30 ENCOUNTER — Telehealth: Payer: Self-pay | Admitting: Cardiology

## 2018-05-30 NOTE — Telephone Encounter (Signed)
New Message   Pt c/o medication issue:  1. Name of Medication: losartan (COZAAR) 100 MG tablet(Expired)  2. How are you currently taking this medication (dosage and times per day)? Take 1 tablet (100 mg total) by mouth daily  3. Are you having a reaction (difficulty breathing--STAT)? no  4. What is your medication issue? Pt states that his pharmacy has been trying to get his meds refilled for the past 10 days and he is out of his medication and will be leaving to go out of town Friday. Please call

## 2018-05-31 MED ORDER — LOSARTAN POTASSIUM 100 MG PO TABS: 100.0000 mg | ORAL_TABLET | Freq: Every day | ORAL | 3 refills | Status: DC

## 2018-05-31 NOTE — Telephone Encounter (Signed)
Patient aware RX called into pharmacy --90 DAY SUPPLY X 3  REFILL.

## 2018-06-19 NOTE — Progress Notes (Signed)
Dan [name] Date of Birth: Feb 20, 1951   History of Present Illness Dan [name] is seen today for follow up of atrial fibrillation. He has a history of chronic atrial fibrillation that has been managed with rate control and anticoagulation. Stress Myoview in August 2014 which was low risk with a small fixed apical defect. No ischemia. He also has a history of HTN, DM, obesity, and HLD. 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 There was incidental findings of gallstones. The liver was nodular in appearance c/w cirrhosis. Subsequent CT showed evidence of cirrhosis. He did have normal EGD in February without varices and colonoscopy showed some polyps and tics.   He has been doing Weight watchers for 8 months and has lost 47 lbs. He feels much better. He is walking 40-45 minutes a day. BP at home ranges from 109-323 systolic. Sugars are much better. Plans to lose more weight. Denies any chest pain, dyspnea, palpitations or edema.   Current Outpatient Medications on File Prior to Visit  Medication Sig Dispense Refill  . amLODipine (NORVASC) 10 MG tablet Take 10 mg by mouth every morning.     Marland Kitchen BYSTOLIC 10 MG tablet Take 40 mg by mouth every morning.   2  . dabigatran (PRADAXA) 150 MG CAPS capsule Take 1 capsule (150 mg total) by mouth every 12 (twelve) hours. 180 capsule 0  . ezetimibe-simvastatin (VYTORIN) 10-20 MG per tablet Take 1 tablet by mouth every morning.     . furosemide (LASIX) 40 MG tablet Take 1 tablet (40 mg total) by mouth daily. 30 tablet 5  . hydrALAZINE (APRESOLINE) 100 MG tablet Take 1 tablet (100 mg total) by mouth 3 (three) times daily. 270 tablet 0  . losartan (COZAAR) 100 MG tablet Take 1 tablet (100 mg total) by mouth daily. 90 tablet 3  . metFORMIN (GLUCOPHAGE) 1000 MG tablet Take 1 tablet by mouth 2 (two) times daily with a meal.     . omeprazole (PRILOSEC) 40 MG capsule Take 1  capsule (40 mg total) by mouth daily. 30 capsule 11  . spironolactone (ALDACTONE) 25 MG tablet Take 1 tablet (25 mg total) by mouth daily. 30 tablet 6   No current facility-administered medications on file prior to visit.     Allergies  Allergen Reactions  . Macadamia Nut Oil Shortness Of Breath    Past Medical History:  Diagnosis Date  . Arthritis    osteoarthritis left knee  . BPH (benign prostatic hypertrophy)   . Cholelithiasis   . Chronic atrial fibrillation ([place])   . Colon polyps    adenomatous  . Diabetes mellitus   . Diverticulosis   . Dysrhythmia    tx. Pradaxa- Dr. Martinique follows  . Elevated LFTs   . GERD (gastroesophageal reflux disease)   . Hemorrhoids   . Hyperlipidemia   . Hypertension   . Morbid obesity ([place])   . Sleep apnea    CPAP nightly  . Tubular adenoma of colon     Past Surgical History:  Procedure Laterality Date  . KNEE ARTHROSCOPY Left 03/28/2014   Procedure: LEFT KNEE ARTHROSCOPY WITH DEBRIDEMENT  ;  Surgeon: Johnn Hai, MD;  Location: [facility];  Service: Orthopedics;  Laterality: Left;  . TONSILLECTOMY    . TOTAL KNEE ARTHROPLASTY Left 04/30/2015   Procedure: LEFT TOTAL KNEE ARTHROPLASTY;  Surgeon: Susa Day, MD;  Location: [facility];  Service: Orthopedics;  Laterality: Left;  . US ECHOCARDIOGRAPHY  08/08/2006   ef 55-60%    Social History   Tobacco Use  Smoking Status Former Smoker  . Last attempt to quit: 06/20/1991  . Years since quitting: 27.0  Smokeless Tobacco Former Systems developer    Social History   Substance and Sexual Activity  Alcohol Use Yes  . Alcohol/week: 0.0 standard drinks   Comment: social    Family History  Problem Relation Age of Onset  . Heart attack Father   . Colon cancer Mother   . Breast cancer Sister     Review of Systems: As per history of present illness.  All other systems were reviewed and are negative.  Physical Exam: BP 110/70   Pulse 86   Ht 5' 11"  (1.803 m)   Wt 254 lb  3.2 oz (115.3 kg)   BMI 35.45 kg/m   GENERAL:  Well appearing WM in NAD HEENT:  PERRL, EOMI, sclera are clear. Oropharynx is clear. NECK:  No jugular venous distention, carotid upstroke brisk and symmetric, no bruits, no thyromegaly or adenopathy LUNGS:  Clear to auscultation bilaterally CHEST:  Unremarkable HEART:  RRR,  PMI not displaced or sustained,S1 and S2 within normal limits, no S3, no S4: no clicks, no rubs, no murmurs ABD:  Soft, nontender. BS +, no masses or bruits. No hepatomegaly, no splenomegaly EXT:  2 + pulses throughout, no edema, no cyanosis no clubbing SKIN:  Warm and dry.  Hyperpigmentation LE.  NEURO:  Alert and oriented x 3. Cranial nerves II through XII intact. PSYCH:  Cognitively intact    LABORATORY DATA: Lab Results  Component Value Date   WBC 8.0 12/02/2016   HGB 15.0 12/02/2016   HCT 45.5 12/02/2016   PLT 224.0 12/02/2016   GLUCOSE 187 (H) 12/13/2016   ALT 25 12/02/2016   AST 25 12/02/2016   NA 140 12/13/2016   K 4.2 12/13/2016   CL 99 12/13/2016   CREATININE 0.83 12/13/2016   BUN 15 12/13/2016   CO2 30 12/13/2016   INR 1.3 (H) 12/02/2016   Labs dated 06/10/16: cholesterol 112, triglycerides 122, HDL 37, LDL 51.  Dated 2.5.18: A1c 7.4%.  Dated 06/15/17: cholesterol 104, triglycerides 127, HDL 38, LDL 41, CBC, CMET, TSH normal Dated 03/15/18: A1c 5.9%.   ECG today demonstrates atrial fibrillation with a controlled ventricular response of 86 beats per minute. It is otherwise normal. I have personally reviewed and interpreted this study.  Assessment / Plan:d 1. Permanent atrial fibrillation. Rate is well controlled and he is anticoagulated with Pradaxa. He is asymptomatic. We will continue with his current therapy.  2. Low risk Myoview in 2014 without ischemia.  3. Hypertension-previously resistant. Now with excellent control related to significant weight loss. Recommend reducing lasix to 20 mg daily and hydralazine to 50 mg bid. Congratulated on  weight loss. Will follow up in 6 months and if BP still doing well will reduce BP meds further.   4. Diabetes mellitus type 2-controlled.  5. Hyperlipidemia on Vytorin. Due for fasting labs next month with Dr. Reynaldo Minium.   6. Edema. Resolved.   7. Hepatic cirrhosis. Probably related to fatty liver.

## 2018-06-20 ENCOUNTER — Encounter: Payer: Self-pay | Admitting: Cardiology

## 2018-06-20 ENCOUNTER — Ambulatory Visit: Payer: 59 | Admitting: Cardiology

## 2018-06-20 VITALS — BP 110/70 | HR 86 | Ht 71.0 in | Wt 254.2 lb

## 2018-06-20 DIAGNOSIS — I10 Essential (primary) hypertension: Secondary | ICD-10-CM

## 2018-06-20 DIAGNOSIS — I482 Chronic atrial fibrillation, unspecified: Secondary | ICD-10-CM

## 2018-06-20 DIAGNOSIS — E785 Hyperlipidemia, unspecified: Secondary | ICD-10-CM

## 2018-06-20 DIAGNOSIS — E118 Type 2 diabetes mellitus with unspecified complications: Secondary | ICD-10-CM | POA: Diagnosis not present

## 2018-06-20 MED ORDER — HYDRALAZINE HCL 100 MG PO TABS: 50.0000 mg | ORAL_TABLET | Freq: Two times a day (BID) | ORAL | 0 refills | Status: DC

## 2018-06-20 MED ORDER — FUROSEMIDE 40 MG PO TABS: 20.0000 mg | ORAL_TABLET | Freq: Every day | ORAL | 5 refills | Status: DC

## 2018-06-20 NOTE — Patient Instructions (Addendum)
Reduce hydralazine to 50 mg twice a day and lasix to 20 mg daily  Continue your other therapy  I will see you in 6 months

## 2018-08-08 DIAGNOSIS — H9313 Tinnitus, bilateral: Secondary | ICD-10-CM | POA: Insufficient documentation

## 2018-08-29 ENCOUNTER — Other Ambulatory Visit: Payer: Self-pay

## 2018-08-29 MED ORDER — SPIRONOLACTONE 25 MG PO TABS
25.0000 mg | ORAL_TABLET | Freq: Every day | ORAL | 3 refills | Status: DC
Start: 2018-08-29 — End: 2019-08-12

## 2018-11-08 ENCOUNTER — Other Ambulatory Visit: Payer: Self-pay | Admitting: *Deleted

## 2018-11-08 ENCOUNTER — Telehealth: Payer: Self-pay | Admitting: Cardiology

## 2018-11-08 MED ORDER — HYDRALAZINE HCL 50 MG PO TABS: 50.0000 mg | ORAL_TABLET | Freq: Two times a day (BID) | ORAL | 1 refills | Status: DC

## 2018-11-08 NOTE — Telephone Encounter (Signed)
Ok to refill with the 50 mg tablet.  Peter Martinique MD, [facility]

## 2018-11-08 NOTE — Telephone Encounter (Signed)
Pt calling requesting a refill for hydralazine 50 mg tablet, taken 50 mg in AM and 50 mg in PM. Pt now takes hydralazine 100 mg tablet 1/2 tablet in AM and 1/2 tablet in PM. Pt stated that he would rather have the hydralazine 50 mg tablet. Please address

## 2018-11-09 NOTE — Telephone Encounter (Signed)
Hydralazine 50 mg refill sent to pharmacy 11/08/18.

## 2018-12-01 ENCOUNTER — Other Ambulatory Visit: Payer: Self-pay | Admitting: Internal Medicine

## 2019-03-19 ENCOUNTER — Other Ambulatory Visit: Payer: Self-pay

## 2019-03-19 MED ORDER — LOSARTAN POTASSIUM 100 MG PO TABS: 100.0000 mg | ORAL_TABLET | Freq: Every day | ORAL | 1 refills | Status: DC

## 2019-03-21 ENCOUNTER — Other Ambulatory Visit: Payer: Self-pay

## 2019-03-21 MED ORDER — LOSARTAN POTASSIUM 100 MG PO TABS: 100.0000 mg | ORAL_TABLET | Freq: Every day | ORAL | 1 refills | Status: DC

## 2019-03-25 ENCOUNTER — Other Ambulatory Visit: Payer: Self-pay | Admitting: *Deleted

## 2019-03-25 MED ORDER — LOSARTAN POTASSIUM 100 MG PO TABS: 100.0000 mg | ORAL_TABLET | Freq: Every day | ORAL | 1 refills | Status: DC

## 2019-04-04 ENCOUNTER — Telehealth: Payer: Self-pay | Admitting: Cardiology

## 2019-04-04 NOTE — Telephone Encounter (Signed)
Wrong provider

## 2019-04-04 NOTE — Telephone Encounter (Signed)
° ° °  COVID-19 Pre-Screening Questions:   In the past 7 to 10 days have you had a cough,  shortness of breath, headache, congestion, fever (100 or greater) body aches, chills, sore throat, or sudden loss of taste or sense of smell? noHave you been around anyone with known Covid 19.  Have you been around anyone who is awaiting Covid 19 test results in the past 7 to 10 days? no  Have you been around anyone who has been exposed to Covid 19, or has mentioned symptoms of Covid 19 within the past 7 to 10 days? no  If you have any concerns/questions about symptoms patients report during screening (either on the phone or at threshold). Contact the provider seeing the patient or DOD for further guidance.  If neither are available contact a member of the leadership team.     I called pt to confoirm his appt with Dr Martinique on 04-08-19.

## 2019-04-05 NOTE — Progress Notes (Signed)
[name] Date of Birth: [birth year]   History of Present Illness Dan [name] is seen today for follow up of atrial fibrillation. He has a history of chronic atrial fibrillation that has been managed with rate control and anticoagulation. Stress Myoview in August 2014 which was low risk with a small fixed apical defect. No ischemia. He also has a history of HTN, DM, obesity, and HLD. 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 There was incidental findings of gallstones. The liver was nodular in appearance c/w cirrhosis. Subsequent CT showed evidence of cirrhosis. He did have normal EGD in February without varices and colonoscopy showed some polyps and tics.   On prior visit he was doing Weight watchers for 8 months and had lost 47 lbs. He felt much better. He has gained some weight back since he is working mostly from home and is less active. Is experiencing shin splints. BP at home typically 145/74.   Current Outpatient Medications on File Prior to Visit  Medication Sig Dispense Refill  . amLODipine (NORVASC) 10 MG tablet Take 10 mg by mouth every morning.     Marland Kitchen BYSTOLIC 10 MG tablet Take 40 mg by mouth every morning.   2  . ezetimibe-simvastatin (VYTORIN) 10-20 MG per tablet Take 1 tablet by mouth every morning.     . furosemide (LASIX) 20 MG tablet Take 20 mg by mouth daily.    Marland Kitchen losartan (COZAAR) 100 MG tablet Take 1 tablet (100 mg total) by mouth daily. 90 tablet 1  . metFORMIN (GLUCOPHAGE) 1000 MG tablet Take 1 tablet by mouth 2 (two) times daily with a meal.     . omeprazole (PRILOSEC) 40 MG capsule TAKE 1 CAPSULE(40 MG) BY MOUTH DAILY 30 capsule 3  . spironolactone (ALDACTONE) 25 MG tablet Take 1 tablet (25 mg total) by mouth daily. 90 tablet 3  . hydrALAZINE (APRESOLINE) 50 MG tablet Take 1 tablet (50 mg total) by mouth 2 (two) times daily. 180 tablet 1   No current facility-administered  medications on file prior to visit.     Allergies  Allergen Reactions  . Macadamia Nut Oil Shortness Of Breath    Past Medical History:  Diagnosis Date  . Arthritis    osteoarthritis left knee  . BPH (benign prostatic hypertrophy)   . Cholelithiasis   . Chronic atrial fibrillation   . Colon polyps    adenomatous  . Diabetes mellitus   . Diverticulosis   . Dysrhythmia    tx. Pradaxa- Dr. Martinique follows  . Elevated LFTs   . GERD (gastroesophageal reflux disease)   . Hemorrhoids   . Hyperlipidemia   . Hypertension   . Morbid obesity ([place])   . Sleep apnea    CPAP nightly  . Tubular adenoma of colon     Past Surgical History:  Procedure Laterality Date  . KNEE ARTHROSCOPY Left 03/28/2014   Procedure: LEFT KNEE ARTHROSCOPY WITH DEBRIDEMENT  ;  Surgeon: Johnn Hai, MD;  Location: [facility];  Service: Orthopedics;  Laterality: Left;  . TONSILLECTOMY    . TOTAL KNEE ARTHROPLASTY Left 04/30/2015   Procedure: LEFT TOTAL KNEE ARTHROPLASTY;  Surgeon: Susa Day, MD;  Location: [facility];  Service: Orthopedics;  Laterality: Left;  . US ECHOCARDIOGRAPHY  08/08/2006   ef 55-60%    Social History   Tobacco Use  Smoking Status Former Smoker  . Quit  date: 06/20/1991  . Years since quitting: 27.8  Smokeless Tobacco Former Systems developer    Social History   Substance and Sexual Activity  Alcohol Use Yes  . Alcohol/week: 0.0 standard drinks   Comment: social    Family History  Problem Relation Age of Onset  . Heart attack Father   . Colon cancer Mother   . Breast cancer Sister     Review of Systems: As per history of present illness.  All other systems were reviewed and are negative.  Physical Exam: BP 126/66   Pulse 72   Temp (!) 97.2 F (36.2 C)   Ht 6' 1"  (1.854 m)   Wt 273 lb (123.8 kg)   BMI 36.02 kg/m   GENERAL:  Well appearing WM in NAD HEENT:  PERRL, EOMI, sclera are clear. Oropharynx is clear. NECK:  No jugular venous distention, carotid  upstroke brisk and symmetric, no bruits, no thyromegaly or adenopathy LUNGS:  Clear to auscultation bilaterally CHEST:  Unremarkable HEART:  RRR,  PMI not displaced or sustained,S1 and S2 within normal limits, no S3, no S4: no clicks, no rubs, no murmurs ABD:  Soft, nontender. BS +, no masses or bruits. No hepatomegaly, no splenomegaly EXT:  2 + pulses throughout, no edema, no cyanosis no clubbing SKIN:  Warm and dry.  Hyperpigmentation LE. Excellent pulses. NEURO:  Alert and oriented x 3. Cranial nerves II through XII intact. PSYCH:  Cognitively intact    LABORATORY DATA: Lab Results  Component Value Date   WBC 8.0 12/02/2016   HGB 15.0 12/02/2016   HCT 45.5 12/02/2016   PLT 224.0 12/02/2016   GLUCOSE 187 (H) 12/13/2016   ALT 25 12/02/2016   AST 25 12/02/2016   NA 140 12/13/2016   K 4.2 12/13/2016   CL 99 12/13/2016   CREATININE 0.83 12/13/2016   BUN 15 12/13/2016   CO2 30 12/13/2016   INR 1.3 (H) 12/02/2016   Labs dated 06/10/16: cholesterol 112, triglycerides 122, HDL 37, LDL 51.  Dated 2.5.18: A1c 7.4%.  Dated 06/15/17: cholesterol 104, triglycerides 127, HDL 38, LDL 41, CBC, CMET, TSH normal Dated 03/15/18: A1c 5.9%.  Dated 07/27/18: cholesterol 99, triglycerides 68, HDL 46, LDL 39. CMET, CBC, TSH normal Dated 04/01/19 A1c 6.8%.   ECG today demonstrates atrial fibrillation with a controlled ventricular response of 86 beats per minute. It is otherwise normal. I have personally reviewed and interpreted this study.  Assessment / Plan:d 1. Permanent atrial fibrillation. Rate is well controlled and he is anticoagulated with Pradaxa. Refilled today. He is asymptomatic. We will continue with his current therapy.  2. Low risk Myoview in 2014 without ischemia.  3. Hypertension-previously resistant. Now with excellent control related to significant weight loss. Recommend continuing current therapy  4. Diabetes mellitus type 2-cA1c 6.8%  5. Hyperlipidemia on Vytorin. Excellent  control  6. Edema. Resolved.   7. Hepatic cirrhosis. Probably related to fatty liver.  8. Shin pain. Excellent pulses in feet. Doubt vascular.

## 2019-04-08 ENCOUNTER — Encounter: Payer: Self-pay | Admitting: Cardiology

## 2019-04-08 ENCOUNTER — Other Ambulatory Visit: Payer: Self-pay

## 2019-04-08 ENCOUNTER — Ambulatory Visit (INDEPENDENT_AMBULATORY_CARE_PROVIDER_SITE_OTHER): Payer: 59 | Admitting: Cardiology

## 2019-04-08 VITALS — BP 126/66 | HR 72 | Temp 97.2°F | Ht 73.0 in | Wt 273.0 lb

## 2019-04-08 DIAGNOSIS — I10 Essential (primary) hypertension: Secondary | ICD-10-CM

## 2019-04-08 DIAGNOSIS — E785 Hyperlipidemia, unspecified: Secondary | ICD-10-CM

## 2019-04-08 DIAGNOSIS — I482 Chronic atrial fibrillation, unspecified: Secondary | ICD-10-CM

## 2019-04-08 MED ORDER — DABIGATRAN ETEXILATE MESYLATE 150 MG PO CAPS: 150.0000 mg | ORAL_CAPSULE | Freq: Two times a day (BID) | ORAL | 3 refills | Status: DC

## 2019-04-08 NOTE — Patient Instructions (Signed)
Continue your current therapy  Follow up in 6 months

## 2019-04-14 ENCOUNTER — Other Ambulatory Visit: Payer: Self-pay | Admitting: Internal Medicine

## 2019-05-16 ENCOUNTER — Other Ambulatory Visit: Payer: Self-pay | Admitting: Cardiology

## 2019-05-16 MED ORDER — HYDRALAZINE HCL 50 MG PO TABS: 50.0000 mg | ORAL_TABLET | Freq: Two times a day (BID) | ORAL | 1 refills | Status: DC

## 2019-05-16 NOTE — Telephone Encounter (Signed)
This is Dr. Doug Sou pt.

## 2019-08-12 ENCOUNTER — Other Ambulatory Visit: Payer: Self-pay

## 2019-08-12 MED ORDER — SPIRONOLACTONE 25 MG PO TABS: 25.0000 mg | ORAL_TABLET | Freq: Every day | ORAL | 3 refills | Status: DC

## 2019-08-19 ENCOUNTER — Other Ambulatory Visit: Payer: Self-pay | Admitting: Internal Medicine

## 2019-09-04 ENCOUNTER — Telehealth: Payer: Self-pay | Admitting: Internal Medicine

## 2019-09-04 NOTE — Telephone Encounter (Signed)
Sent mychart message asking patient to find out what his insurance company will pay for and then we can switch it.

## 2019-11-03 ENCOUNTER — Other Ambulatory Visit: Payer: Self-pay | Admitting: Cardiology

## 2019-11-11 ENCOUNTER — Ambulatory Visit: Payer: 59 | Admitting: Cardiology

## 2019-11-24 ENCOUNTER — Ambulatory Visit: Payer: No Typology Code available for payment source | Attending: Internal Medicine

## 2019-11-24 DIAGNOSIS — Z23 Encounter for immunization: Secondary | ICD-10-CM | POA: Insufficient documentation

## 2019-11-24 NOTE — Progress Notes (Signed)
   Covid-19 Vaccination Clinic  Name:  MAREO PORTILLA    MRN: [id number] DOB: Jan 06, 1951  11/24/2019  Mr. Barrientes was observed post Covid-19 immunization for 15 minutes without incidence. He was provided with Vaccine Information Sheet and instruction to access the V-Safe system.   Mr. Barham was instructed to call 911 with any severe reactions post vaccine: Marland Kitchen Difficulty breathing  . Swelling of your face and throat  . A fast heartbeat  . A bad rash all over your body  . Dizziness and weakness    Immunizations Administered    Name Date Dose VIS Date Route   Pfizer COVID-19 Vaccine 11/24/2019 10:18 AM 0.3 mL 09/13/2019 Intramuscular   Manufacturer: [place]   Lot: [id number]   [place]: [id number]

## 2019-12-06 NOTE — Progress Notes (Signed)
[name] Date of Birth: 09/13/1951   History of Present Illness My is seen today for follow up of atrial fibrillation. He has a history of chronic atrial fibrillation that has been managed with rate control and anticoagulation. Stress Myoview in August 2014 which was low risk with a small fixed apical defect. No ischemia. He also has a history of HTN, DM, obesity, and HLD. 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 There was incidental findings of gallstones. The liver was nodular in appearance c/w cirrhosis. Subsequent CT showed evidence of cirrhosis. He did have normal EGD in February without varices and colonoscopy showed some polyps and tics.   On prior visit he was doing Weight watchers for 8 months and had lost 47 lbs. He felt much better. He has gained some weight back since he is working mostly from home and is less active. He is starting back on Weight watchers. He denies any dyspnea, palpitations, chest pain, or edema.  Current Outpatient Medications on File Prior to Visit  Medication Sig Dispense Refill  . amLODipine (NORVASC) 10 MG tablet Take 10 mg by mouth every morning.     Marland Kitchen BYSTOLIC 10 MG tablet Take 40 mg by mouth every morning.   2  . cephALEXin (KEFLEX) 500 MG capsule cephalexin 500 mg capsule  TAKE 2 CAPSULES BY MOUTH 2 HOURS BEFORE DENTAL PROCEDURE THEN TAKE 1 CAPSULE BY MOUTH EVERY DAY 6 HOURS AFTER PROCEDURE    . dabigatran (PRADAXA) 150 MG CAPS capsule Take 1 capsule (150 mg total) by mouth every 12 (twelve) hours. 180 capsule 3  . ezetimibe-simvastatin (VYTORIN) 10-20 MG per tablet Take 1 tablet by mouth every morning.     . fluticasone (FLONASE) 50 MCG/ACT nasal spray Place into the nose.    . furosemide (LASIX) 20 MG tablet Take 20 mg by mouth daily.    . hydrALAZINE (APRESOLINE) 50 MG tablet TAKE 1 TABLET(50 MG) BY MOUTH TWICE DAILY 180 tablet 1  . metFORMIN  (GLUCOPHAGE) 1000 MG tablet Take 1 tablet by mouth 2 (two) times daily with a meal.     . omeprazole (PRILOSEC) 40 MG capsule TAKE 1 CAPSULE(40 MG) BY MOUTH DAILY 30 capsule 3  . oxyCODONE-acetaminophen (PERCOCET) 5-325 MG tablet Percocet 5 mg-325 mg tablet  Take 1 tablet every 6 hours by oral route as needed for 5 days.    Marland Kitchen spironolactone (ALDACTONE) 25 MG tablet Take 1 tablet (25 mg total) by mouth daily. 90 tablet 3  . losartan (COZAAR) 100 MG tablet Take 1 tablet (100 mg total) by mouth daily. 90 tablet 1   No current facility-administered medications on file prior to visit.    Allergies  Allergen Reactions  . Macadamia Nut Oil Shortness Of Breath    Past Medical History:  Diagnosis Date  . Arthritis    osteoarthritis left knee  . BPH (benign prostatic hypertrophy)   . Cholelithiasis   . Chronic atrial fibrillation ([place])   . Colon polyps    adenomatous  . Diabetes mellitus   . Diverticulosis   . Dysrhythmia    tx. Pradaxa- Dr. Martinique follows  . Elevated LFTs   . GERD (gastroesophageal reflux disease)   . Hemorrhoids   . Hyperlipidemia   . Hypertension   . Morbid obesity ([place])   . Sleep apnea    CPAP nightly  . Tubular adenoma of colon  Past Surgical History:  Procedure Laterality Date  . KNEE ARTHROSCOPY Left 03/28/2014   Procedure: LEFT KNEE ARTHROSCOPY WITH DEBRIDEMENT  ;  Surgeon: Johnn Hai, MD;  Location: [facility];  Service: Orthopedics;  Laterality: Left;  . TONSILLECTOMY    . TOTAL KNEE ARTHROPLASTY Left 04/30/2015   Procedure: LEFT TOTAL KNEE ARTHROPLASTY;  Surgeon: Susa Day, MD;  Location: [facility];  Service: Orthopedics;  Laterality: Left;  . US ECHOCARDIOGRAPHY  08/08/2006   ef 55-60%    Social History   Tobacco Use  Smoking Status Former Smoker  . Quit date: 06/20/1991  . Years since quitting: 28.4  Smokeless Tobacco Former Systems developer    Social History   Substance and Sexual Activity  Alcohol Use Yes  . Alcohol/week:  0.0 standard drinks   Comment: social    Family History  Problem Relation Age of Onset  . Heart attack Father   . Colon cancer Mother   . Breast cancer Sister     Review of Systems: As per history of present illness.  All other systems were reviewed and are negative.  Physical Exam: BP (!) 146/83   Pulse 62   Temp (!) 97.3 F (36.3 C)   Resp (!) 22   Ht 6' 1"  (1.854 m)   Wt 286 lb 3.2 oz (129.8 kg)   SpO2 95%   BMI 37.76 kg/m   GENERAL:  Well appearing, obese WM in NAD HEENT:  PERRL, EOMI, sclera are clear. Oropharynx is clear. NECK:  No jugular venous distention, carotid upstroke brisk and symmetric, no bruits, no thyromegaly or adenopathy LUNGS:  Clear to auscultation bilaterally CHEST:  Unremarkable HEART:  RRR,  PMI not displaced or sustained,S1 and S2 within normal limits, no S3, no S4: no clicks, no rubs, no murmurs ABD:  Soft, nontender. BS +, no masses or bruits. No hepatomegaly, no splenomegaly EXT:  2 + pulses throughout, no edema, no cyanosis no clubbing SKIN:  Warm and dry.  Hyperpigmentation LE. Excellent pulses. NEURO:  Alert and oriented x 3. Cranial nerves II through XII intact. PSYCH:  Cognitively intact    LABORATORY DATA: Lab Results  Component Value Date   WBC 8.0 12/02/2016   HGB 15.0 12/02/2016   HCT 45.5 12/02/2016   PLT 224.0 12/02/2016   GLUCOSE 187 (H) 12/13/2016   ALT 25 12/02/2016   AST 25 12/02/2016   NA 140 12/13/2016   K 4.2 12/13/2016   CL 99 12/13/2016   CREATININE 0.83 12/13/2016   BUN 15 12/13/2016   CO2 30 12/13/2016   INR 1.3 (H) 12/02/2016   Labs dated 06/10/16: cholesterol 112, triglycerides 122, HDL 37, LDL 51.  Dated 2.5.18: A1c 7.4%.  Dated 06/15/17: cholesterol 104, triglycerides 127, HDL 38, LDL 41, CBC, CMET, TSH normal Dated 03/15/18: A1c 5.9%.  Dated 07/27/18: cholesterol 99, triglycerides 68, HDL 46, LDL 39. CMET, CBC, TSH normal Dated 04/01/19 A1c 6.8%.  Dated 07/31/19: cholesterol 126, triglycerides 169, HDL  43, LDL 49. A1c 7.3%. CBC, CMET and TSH normal   ECG today demonstrates atrial fibrillation with a controlled ventricular response of 83 beats per minute. It is otherwise normal. I have personally reviewed and interpreted this study.  Assessment / Plan:d 1. Permanent atrial fibrillation. Rate is well controlled and he is anticoagulated with Pradaxa. Refilled today. He is asymptomatic. We will continue with his current therapy.  2. Low risk Myoview in 2014 without ischemia.  3. Hypertension-previously resistant. He did have an excellent response to weight  loss before with significant improvement in BP. Encourage Weight watchers. Will leave medicine as is for now.  4. Diabetes mellitus type 2-A1c 7.3%  5. Hyperlipidemia on Vytorin. Excellent control  6. Edema. Resolved.   7. Hepatic cirrhosis. Probably related to fatty liver.

## 2019-12-08 ENCOUNTER — Other Ambulatory Visit: Payer: Self-pay | Admitting: Cardiology

## 2019-12-09 ENCOUNTER — Other Ambulatory Visit: Payer: Self-pay

## 2019-12-09 ENCOUNTER — Ambulatory Visit (INDEPENDENT_AMBULATORY_CARE_PROVIDER_SITE_OTHER): Payer: No Typology Code available for payment source | Admitting: Cardiology

## 2019-12-09 ENCOUNTER — Encounter: Payer: Self-pay | Admitting: Cardiology

## 2019-12-09 VITALS — BP 146/83 | HR 62 | Temp 97.3°F | Resp 22 | Ht 73.0 in | Wt 286.2 lb

## 2019-12-09 DIAGNOSIS — I482 Chronic atrial fibrillation, unspecified: Secondary | ICD-10-CM | POA: Diagnosis not present

## 2019-12-09 DIAGNOSIS — I10 Essential (primary) hypertension: Secondary | ICD-10-CM

## 2019-12-09 DIAGNOSIS — E785 Hyperlipidemia, unspecified: Secondary | ICD-10-CM | POA: Diagnosis not present

## 2019-12-09 MED ORDER — HYDRALAZINE HCL 50 MG PO TABS: ORAL_TABLET | ORAL | 2 refills | Status: DC

## 2019-12-09 MED ORDER — DABIGATRAN ETEXILATE MESYLATE 150 MG PO CAPS: 150.0000 mg | ORAL_CAPSULE | Freq: Two times a day (BID) | ORAL | 3 refills | Status: DC

## 2019-12-18 ENCOUNTER — Ambulatory Visit: Payer: Medicare Other | Attending: Internal Medicine

## 2019-12-18 DIAGNOSIS — Z23 Encounter for immunization: Secondary | ICD-10-CM

## 2019-12-18 NOTE — Progress Notes (Signed)
   Covid-19 Vaccination Clinic  Name:  Dan [name]    MRN: [id number] DOB: 1951/09/24  12/18/2019  Dan [name] was observed post Covid-19 immunization for 30 minutes based on pre-vaccination screening without incident. He was provided with Vaccine Information Sheet and instruction to access the V-Safe system.   Dan [name] was instructed to call 911 with any severe reactions post vaccine: Marland Kitchen Difficulty breathing  . Swelling of face and throat  . A fast heartbeat  . A bad rash all over body  . Dizziness and weakness   Immunizations Administered    Name Date Dose VIS Date Route   Pfizer COVID-19 Vaccine 12/18/2019  8:39 AM 0.3 mL 09/13/2019 Intramuscular   Manufacturer: [place]   Lot: [id number]   [place]: [id number]

## 2020-03-11 ENCOUNTER — Telehealth: Payer: Self-pay

## 2020-03-13 ENCOUNTER — Telehealth: Payer: Self-pay | Admitting: *Deleted

## 2020-03-13 NOTE — Telephone Encounter (Signed)
Refill

## 2020-03-13 NOTE — Telephone Encounter (Signed)
Received fax from [facility] about patient being on Losartan and Lisinopril  Dr Yetta Numbers Rx's Lisinopril 12/30/2019 Dr Martinique reviewed and suggested contacting Dr Yetta Numbers since he started the Lisinopril after patient already taking Losartan  Left message at [facility]

## 2020-03-16 ENCOUNTER — Other Ambulatory Visit: Payer: Self-pay | Admitting: Podiatry

## 2020-03-16 ENCOUNTER — Ambulatory Visit: Payer: PPO | Admitting: Podiatry

## 2020-03-16 ENCOUNTER — Encounter: Payer: Self-pay | Admitting: Podiatry

## 2020-03-16 ENCOUNTER — Other Ambulatory Visit: Payer: Self-pay

## 2020-03-16 ENCOUNTER — Ambulatory Visit (INDEPENDENT_AMBULATORY_CARE_PROVIDER_SITE_OTHER): Payer: No Typology Code available for payment source

## 2020-03-16 VITALS — BP 133/78 | HR 84 | Temp 97.2°F | Resp 16

## 2020-03-16 DIAGNOSIS — I480 Paroxysmal atrial fibrillation: Secondary | ICD-10-CM | POA: Diagnosis not present

## 2020-03-16 DIAGNOSIS — M76822 Posterior tibial tendinitis, left leg: Secondary | ICD-10-CM

## 2020-03-16 DIAGNOSIS — M79672 Pain in left foot: Secondary | ICD-10-CM | POA: Diagnosis not present

## 2020-03-16 DIAGNOSIS — I519 Heart disease, unspecified: Secondary | ICD-10-CM | POA: Diagnosis not present

## 2020-03-16 DIAGNOSIS — M79671 Pain in right foot: Secondary | ICD-10-CM

## 2020-03-16 DIAGNOSIS — B351 Tinea unguium: Secondary | ICD-10-CM

## 2020-03-16 DIAGNOSIS — M21619 Bunion of unspecified foot: Secondary | ICD-10-CM | POA: Diagnosis not present

## 2020-03-16 DIAGNOSIS — M76821 Posterior tibial tendinitis, right leg: Secondary | ICD-10-CM

## 2020-03-16 DIAGNOSIS — I10 Essential (primary) hypertension: Secondary | ICD-10-CM | POA: Diagnosis not present

## 2020-03-16 DIAGNOSIS — E1169 Type 2 diabetes mellitus with other specified complication: Secondary | ICD-10-CM | POA: Diagnosis not present

## 2020-03-16 DIAGNOSIS — E669 Obesity, unspecified: Secondary | ICD-10-CM | POA: Diagnosis not present

## 2020-03-16 NOTE — Progress Notes (Signed)
Subjective:   Patient ID: Dan [name], male   DOB: 69 y.o.   MRN: [id number]   HPI Patient states he has had several year history of pain in his inside of his ankles of both feet with flat feet with moderate obesity is complicating factor and states also concerned about nail discoloration nonpainful currently and took oral medicine a number years ago.  Patient does not smoke likes to be active   Review of Systems  All other systems reviewed and are negative.       Objective:  Physical Exam Vitals and nursing note reviewed.  Constitutional:      Appearance: He is well-developed.  Pulmonary:     Effort: Pulmonary effort is normal.  Musculoskeletal:        General: Normal range of motion.  Skin:    General: Skin is warm.  Neurological:     Mental Status: He is alert.     Neurovascular status found to be intact muscle strength found to be within normal limits with mild increased eversion of both feet with moderate flatfoot deformity.  Patient does have some swelling of the inside of the ankle bilateral with inflammation posterior tibial tendon as it comes under the medial malleolus and inserts into the navicular.  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  I went ahead and did sterile prep and injected posterior tibial tendons near insertion bilateral 3 mg Kenalog 5 mg Xylocaine applied fascial braces to lift up the arch and try to provide stability and discussed long-term orthotics.  I then discussed nail conditions do not recommend treatment currently I do not recommend treatment for bunion  left as currently it is nonsymptomatic  X-rays indicate that there is significant structural bunion deformity left with moderate depression of the arch bilateral

## 2020-03-16 NOTE — Progress Notes (Signed)
   Subjective:    Patient ID: Dan [name], male    DOB: [birth year], 69 y.o.   MRN: [id number]  HPI    Review of Systems  All other systems reviewed and are negative.      Objective:   Physical Exam        Assessment & Plan:

## 2020-03-19 DIAGNOSIS — E1169 Type 2 diabetes mellitus with other specified complication: Secondary | ICD-10-CM | POA: Diagnosis not present

## 2020-03-19 DIAGNOSIS — E669 Obesity, unspecified: Secondary | ICD-10-CM | POA: Diagnosis not present

## 2020-03-19 DIAGNOSIS — I10 Essential (primary) hypertension: Secondary | ICD-10-CM | POA: Diagnosis not present

## 2020-04-16 ENCOUNTER — Other Ambulatory Visit: Payer: Self-pay

## 2020-04-16 ENCOUNTER — Encounter: Payer: Self-pay | Admitting: Podiatry

## 2020-04-16 ENCOUNTER — Ambulatory Visit: Payer: PPO | Admitting: Podiatry

## 2020-04-16 VITALS — Temp 96.7°F

## 2020-04-16 DIAGNOSIS — M76822 Posterior tibial tendinitis, left leg: Secondary | ICD-10-CM | POA: Diagnosis not present

## 2020-04-16 DIAGNOSIS — M76821 Posterior tibial tendinitis, right leg: Secondary | ICD-10-CM | POA: Diagnosis not present

## 2020-04-16 DIAGNOSIS — M21619 Bunion of unspecified foot: Secondary | ICD-10-CM

## 2020-04-16 NOTE — Progress Notes (Signed)
Subjective:   Patient ID: Dan [name], male   DOB: 69 y.o.   MRN: [id number]   HPI Patient presents stating that the feet seem to feel better but that he still gets some discomfort and he still has quite a bit of swelling that he was concerned about.  Patient states that he like to see about support for the long-term due to the long-term nature of this problem and his wanting to stay active and prevent further health issues   ROS      Objective:  Physical Exam  Neurovascular status intact with patient having chronic posterior tibial tendinitis bilateral along with varicosities edema in the ankle region bilateral with pain in the posterior tib's which have been improved but still present with braces that can he can only wear temporarily     Assessment:  Foot structural issues with inflammatory posterior tibial tendinitis bilateral along with swelling with moderate obesity is complicating factor     Plan:  H&P reviewed all conditions and today I went ahead and recommended long-term orthotics to lift up the arches and take stress off the posterior tibial tendon.  Patient is casted for functional orthotics and I went ahead today and I advised on ice therapy possible compression therapy elevation therapy until orthotics are returned

## 2020-05-02 ENCOUNTER — Other Ambulatory Visit: Payer: Self-pay | Admitting: Cardiology

## 2020-05-07 ENCOUNTER — Encounter: Payer: No Typology Code available for payment source | Admitting: Orthotics

## 2020-05-14 ENCOUNTER — Other Ambulatory Visit: Payer: Self-pay

## 2020-05-14 ENCOUNTER — Ambulatory Visit (INDEPENDENT_AMBULATORY_CARE_PROVIDER_SITE_OTHER): Payer: PPO | Admitting: Orthotics

## 2020-05-14 DIAGNOSIS — M76822 Posterior tibial tendinitis, left leg: Secondary | ICD-10-CM | POA: Diagnosis not present

## 2020-05-14 DIAGNOSIS — M76821 Posterior tibial tendinitis, right leg: Secondary | ICD-10-CM | POA: Diagnosis not present

## 2020-05-14 NOTE — Progress Notes (Signed)
Patient came in today to pick up custom made foot orthotics.  The goals were accomplished and the patient reported no dissatisfaction with said orthotics.  Patient was advised of breakin period and how to report any issues. 

## 2020-05-18 ENCOUNTER — Telehealth: Payer: Self-pay

## 2020-05-18 MED ORDER — APIXABAN 5 MG PO TABS: 5.0000 mg | ORAL_TABLET | Freq: Two times a day (BID) | ORAL | 3 refills | Status: DC

## 2020-05-18 NOTE — Telephone Encounter (Signed)
Spoke to patient Dr.Jordan received a letter Pradaxa no longer covered with your insurance.He advised change to Eliquis or Xarelto.Stated he would like to start on Eliquis.Advised when he finishes Pradaxa start Eliquis 5 mg twice a day.Prescription sent to pharmacy.

## 2020-08-06 DIAGNOSIS — E1169 Type 2 diabetes mellitus with other specified complication: Secondary | ICD-10-CM | POA: Diagnosis not present

## 2020-08-06 DIAGNOSIS — Z0000 Encounter for general adult medical examination without abnormal findings: Secondary | ICD-10-CM | POA: Diagnosis not present

## 2020-08-06 DIAGNOSIS — E785 Hyperlipidemia, unspecified: Secondary | ICD-10-CM | POA: Diagnosis not present

## 2020-08-06 DIAGNOSIS — Z125 Encounter for screening for malignant neoplasm of prostate: Secondary | ICD-10-CM | POA: Diagnosis not present

## 2020-08-07 ENCOUNTER — Other Ambulatory Visit: Payer: Self-pay | Admitting: Cardiology

## 2020-08-10 DIAGNOSIS — Z7901 Long term (current) use of anticoagulants: Secondary | ICD-10-CM | POA: Diagnosis not present

## 2020-08-10 DIAGNOSIS — Z0000 Encounter for general adult medical examination without abnormal findings: Secondary | ICD-10-CM | POA: Diagnosis not present

## 2020-08-10 DIAGNOSIS — I480 Paroxysmal atrial fibrillation: Secondary | ICD-10-CM | POA: Diagnosis not present

## 2020-08-10 DIAGNOSIS — R82998 Other abnormal findings in urine: Secondary | ICD-10-CM | POA: Diagnosis not present

## 2020-08-10 DIAGNOSIS — R945 Abnormal results of liver function studies: Secondary | ICD-10-CM | POA: Diagnosis not present

## 2020-08-10 DIAGNOSIS — I10 Essential (primary) hypertension: Secondary | ICD-10-CM | POA: Diagnosis not present

## 2020-08-10 DIAGNOSIS — I519 Heart disease, unspecified: Secondary | ICD-10-CM | POA: Diagnosis not present

## 2020-08-10 DIAGNOSIS — E785 Hyperlipidemia, unspecified: Secondary | ICD-10-CM | POA: Diagnosis not present

## 2020-08-10 DIAGNOSIS — Z23 Encounter for immunization: Secondary | ICD-10-CM | POA: Diagnosis not present

## 2020-08-10 DIAGNOSIS — E669 Obesity, unspecified: Secondary | ICD-10-CM | POA: Diagnosis not present

## 2020-08-10 DIAGNOSIS — E119 Type 2 diabetes mellitus without complications: Secondary | ICD-10-CM | POA: Diagnosis not present

## 2020-08-31 DIAGNOSIS — H5203 Hypermetropia, bilateral: Secondary | ICD-10-CM | POA: Diagnosis not present

## 2020-08-31 DIAGNOSIS — E119 Type 2 diabetes mellitus without complications: Secondary | ICD-10-CM | POA: Diagnosis not present

## 2020-08-31 DIAGNOSIS — D3131 Benign neoplasm of right choroid: Secondary | ICD-10-CM | POA: Diagnosis not present

## 2020-08-31 DIAGNOSIS — E1169 Type 2 diabetes mellitus with other specified complication: Secondary | ICD-10-CM | POA: Diagnosis not present

## 2020-09-17 DIAGNOSIS — Z1212 Encounter for screening for malignant neoplasm of rectum: Secondary | ICD-10-CM | POA: Diagnosis not present

## 2020-09-18 ENCOUNTER — Other Ambulatory Visit: Payer: Self-pay | Admitting: Internal Medicine

## 2020-09-18 ENCOUNTER — Ambulatory Visit
Admission: RE | Admit: 2020-09-18 | Discharge: 2020-09-18 | Disposition: A | Discharge status: Home / Self Care | Payer: PPO | Source: Ambulatory Visit | Attending: Internal Medicine | Admitting: Internal Medicine

## 2020-09-18 DIAGNOSIS — R109 Unspecified abdominal pain: Secondary | ICD-10-CM

## 2020-09-18 DIAGNOSIS — K4020 Bilateral inguinal hernia, without obstruction or gangrene, not specified as recurrent: Secondary | ICD-10-CM | POA: Diagnosis not present

## 2020-09-18 DIAGNOSIS — K429 Umbilical hernia without obstruction or gangrene: Secondary | ICD-10-CM | POA: Diagnosis not present

## 2020-09-18 DIAGNOSIS — K8020 Calculus of gallbladder without cholecystitis without obstruction: Secondary | ICD-10-CM | POA: Diagnosis not present

## 2020-09-18 DIAGNOSIS — I10 Essential (primary) hypertension: Secondary | ICD-10-CM | POA: Diagnosis not present

## 2020-09-18 DIAGNOSIS — K7689 Other specified diseases of liver: Secondary | ICD-10-CM | POA: Diagnosis not present

## 2020-09-18 MED ORDER — IOPAMIDOL (ISOVUE-300) INJECTION 61%
100.0000 mL | Freq: Once | INTRAVENOUS | Status: AC | PRN
  Administered 2020-09-18: 100 mL via INTRAVENOUS

## 2020-10-13 DIAGNOSIS — I8312 Varicose veins of left lower extremity with inflammation: Secondary | ICD-10-CM | POA: Diagnosis not present

## 2020-10-13 DIAGNOSIS — I8311 Varicose veins of right lower extremity with inflammation: Secondary | ICD-10-CM | POA: Diagnosis not present

## 2020-10-13 DIAGNOSIS — B353 Tinea pedis: Secondary | ICD-10-CM | POA: Diagnosis not present

## 2020-10-13 DIAGNOSIS — L570 Actinic keratosis: Secondary | ICD-10-CM | POA: Diagnosis not present

## 2020-10-13 DIAGNOSIS — L821 Other seborrheic keratosis: Secondary | ICD-10-CM | POA: Diagnosis not present

## 2020-10-13 DIAGNOSIS — I872 Venous insufficiency (chronic) (peripheral): Secondary | ICD-10-CM | POA: Diagnosis not present

## 2020-10-13 DIAGNOSIS — D1801 Hemangioma of skin and subcutaneous tissue: Secondary | ICD-10-CM | POA: Diagnosis not present

## 2020-10-30 DIAGNOSIS — M5136 Other intervertebral disc degeneration, lumbar region: Secondary | ICD-10-CM | POA: Diagnosis not present

## 2020-10-30 DIAGNOSIS — M545 Low back pain, unspecified: Secondary | ICD-10-CM | POA: Diagnosis not present

## 2020-10-30 DIAGNOSIS — M5416 Radiculopathy, lumbar region: Secondary | ICD-10-CM | POA: Diagnosis not present

## 2020-10-30 DIAGNOSIS — E119 Type 2 diabetes mellitus without complications: Secondary | ICD-10-CM | POA: Diagnosis not present

## 2020-11-10 DIAGNOSIS — M25662 Stiffness of left knee, not elsewhere classified: Secondary | ICD-10-CM | POA: Diagnosis not present

## 2020-11-10 DIAGNOSIS — M25562 Pain in left knee: Secondary | ICD-10-CM | POA: Diagnosis not present

## 2020-11-10 DIAGNOSIS — M545 Low back pain, unspecified: Secondary | ICD-10-CM | POA: Diagnosis not present

## 2020-11-20 DIAGNOSIS — M25562 Pain in left knee: Secondary | ICD-10-CM | POA: Diagnosis not present

## 2020-11-20 DIAGNOSIS — M545 Low back pain, unspecified: Secondary | ICD-10-CM | POA: Diagnosis not present

## 2020-11-20 DIAGNOSIS — M25662 Stiffness of left knee, not elsewhere classified: Secondary | ICD-10-CM | POA: Diagnosis not present

## 2020-12-04 DIAGNOSIS — M25662 Stiffness of left knee, not elsewhere classified: Secondary | ICD-10-CM | POA: Diagnosis not present

## 2020-12-04 DIAGNOSIS — M25562 Pain in left knee: Secondary | ICD-10-CM | POA: Diagnosis not present

## 2020-12-04 DIAGNOSIS — M545 Low back pain, unspecified: Secondary | ICD-10-CM | POA: Diagnosis not present

## 2020-12-07 NOTE — Progress Notes (Signed)
Dan [name] Date of Birth: [birth year]   History of Present Illness Dan [name] is seen today for follow up of atrial fibrillation. He has a history of chronic atrial fibrillation that has been managed with rate control and anticoagulation. Stress Myoview in August 2014 which was low risk with a small fixed apical defect. No ischemia. He also has a history of HTN, DM, obesity, and HLD. 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 There was incidental findings of gallstones. The liver was nodular in appearance c/w cirrhosis. Subsequent CT showed evidence of cirrhosis. He did have normal EGD in February 2019without varices and colonoscopy showed some polyps and tics.   On follow up today he is really focusing on weight loss. Trying to increase his activity but has been limited by knee pain. He is working with PT now and trying to increase his walking distance. Now on Ozempic for DM. Is still unaware of his Afib. No chest pain, dyspnea, palpitations. Edema has improved with his exercise routine.   Current Outpatient Medications on File Prior to Visit  Medication Sig Dispense Refill  . amLODipine (NORVASC) 10 MG tablet Take 10 mg by mouth every morning.    Marland Kitchen apixaban (ELIQUIS) 5 MG TABS tablet Take 1 tablet (5 mg total) by mouth 2 (two) times daily. 180 tablet 3  . BYSTOLIC 10 MG tablet Take 40 mg by mouth every morning.   2  . cephALEXin (KEFLEX) 500 MG capsule cephalexin 500 mg capsule  TAKE 2 CAPSULES BY MOUTH 2 HOURS BEFORE DENTAL PROCEDURE THEN TAKE 1 CAPSULE BY MOUTH EVERY DAY 6 HOURS AFTER PROCEDURE    . ezetimibe-simvastatin (VYTORIN) 10-20 MG per tablet Take 1 tablet by mouth every morning.    . fluticasone (FLONASE) 50 MCG/ACT nasal spray Place into the nose.    . furosemide (LASIX) 20 MG tablet Take 20 mg by mouth daily.    . hydrALAZINE (APRESOLINE) 50 MG tablet TAKE 1 TABLET(50 MG) BY MOUTH TWICE  DAILY 180 tablet 2  . lisinopril (ZESTRIL) 40 MG tablet Take 40 mg by mouth daily.    Marland Kitchen losartan (COZAAR) 100 MG tablet TAKE 1 TABLET(100 MG) BY MOUTH DAILY 90 tablet 1  . metFORMIN (GLUCOPHAGE) 1000 MG tablet Take 1 tablet by mouth 2 (two) times daily with a meal.     . omeprazole (PRILOSEC) 40 MG capsule TAKE 1 CAPSULE(40 MG) BY MOUTH DAILY 30 capsule 3  . OZEMPIC, 1 MG/DOSE, 4 MG/3ML SOPN Taking for 90 days only to see if works    . spironolactone (ALDACTONE) 25 MG tablet TAKE 1 TABLET(25 MG) BY MOUTH DAILY 90 tablet 3   No current facility-administered medications on file prior to visit.    Allergies  Allergen Reactions  . Macadamia Nut Oil Shortness Of Breath    Past Medical History:  Diagnosis Date  . Arthritis    osteoarthritis left knee  . BPH (benign prostatic hypertrophy)   . Cholelithiasis   . Chronic atrial fibrillation ([place])   . Colon polyps    adenomatous  . Diabetes mellitus   . Diverticulosis   . Dysrhythmia    tx. Pradaxa- Dr. Martinique follows  . Elevated LFTs   . GERD (gastroesophageal reflux disease)   . Hemorrhoids   . Hyperlipidemia   . Hypertension   . Morbid obesity ([place])   . Sleep apnea    CPAP nightly  .  Tubular adenoma of colon     Past Surgical History:  Procedure Laterality Date  . KNEE ARTHROSCOPY Left 03/28/2014   Procedure: LEFT KNEE ARTHROSCOPY WITH DEBRIDEMENT  ;  Surgeon: Johnn Hai, MD;  Location: [facility];  Service: Orthopedics;  Laterality: Left;  . TONSILLECTOMY    . TOTAL KNEE ARTHROPLASTY Left 04/30/2015   Procedure: LEFT TOTAL KNEE ARTHROPLASTY;  Surgeon: Susa Day, MD;  Location: [facility];  Service: Orthopedics;  Laterality: Left;  . US ECHOCARDIOGRAPHY  08/08/2006   ef 55-60%    Social History   Tobacco Use  Smoking Status Former Smoker  . Quit date: 06/20/1991  . Years since quitting: 29.4  Smokeless Tobacco Former Systems developer    Social History   Substance and Sexual Activity  Alcohol Use Yes  .  Alcohol/week: 0.0 standard drinks   Comment: social    Family History  Problem Relation Age of Onset  . Heart attack Father   . Colon cancer Mother   . Breast cancer Sister     Review of Systems: As per history of present illness.  All other systems were reviewed and are negative.  Physical Exam: BP 126/80   Pulse (!) 103   Ht 6' 1"  (1.854 m)   Wt 277 lb (125.6 kg)   SpO2 97%   BMI 36.55 kg/m   GENERAL:  Well appearing, obese WM in NAD HEENT:  PERRL, EOMI, sclera are clear. Oropharynx is clear. NECK:  No jugular venous distention, carotid upstroke brisk and symmetric, no bruits, no thyromegaly or adenopathy LUNGS:  Clear to auscultation bilaterally CHEST:  Unremarkable HEART:  IRRR,  PMI not displaced or sustained,S1 and S2 within normal limits, no S3, no S4: no clicks, no rubs, no murmurs ABD:  Soft, nontender. BS +, no masses or bruits. No hepatomegaly, no splenomegaly EXT:  2 + pulses throughout, no edema, skin around ankles is scaly, no cyanosis no clubbing SKIN:  Warm and dry.  Hyperpigmentation LE. Excellent pulses. NEURO:  Alert and oriented x 3. Cranial nerves II through XII intact. PSYCH:  Cognitively intact    LABORATORY DATA: Lab Results  Component Value Date   WBC 8.0 12/02/2016   HGB 15.0 12/02/2016   HCT 45.5 12/02/2016   PLT 224.0 12/02/2016   GLUCOSE 187 (H) 12/13/2016   ALT 25 12/02/2016   AST 25 12/02/2016   NA 140 12/13/2016   K 4.2 12/13/2016   CL 99 12/13/2016   CREATININE 0.83 12/13/2016   BUN 15 12/13/2016   CO2 30 12/13/2016   INR 1.3 (H) 12/02/2016   Labs dated 06/10/16: cholesterol 112, triglycerides 122, HDL 37, LDL 51.  Dated 2.5.18: A1c 7.4%.  Dated 06/15/17: cholesterol 104, triglycerides 127, HDL 38, LDL 41, CBC, CMET, TSH normal Dated 03/15/18: A1c 5.9%.  Dated 07/27/18: cholesterol 99, triglycerides 68, HDL 46, LDL 39. CMET, CBC, TSH normal Dated 04/01/19 A1c 6.8%.  Dated 07/31/19: cholesterol 126, triglycerides 169, HDL 43, LDL  49. A1c 7.3%. CBC, CMET and TSH normal Dated 08/06/20: A1c 6.6%. cholesterol 114, triglycerides 150, HDL 44, LDL 40. CMET and TSH normal   ECG today demonstrates atrial fibrillation with a controlled ventricular response of 103 beats per minute. It is otherwise normal. I have personally reviewed and interpreted this study.  Assessment / Plan:d 1. Permanent atrial fibrillation. Rate is well controlled and he is anticoagulated with Eliquis He is asymptomatic. We will continue with his current therapy.  2. Low risk Myoview in 2014 without ischemia.  3. Hypertension-previously resistant. Now well controlled on multiple meds.   4. Diabetes mellitus type 2-A1c 6.6%. now on Ozempic. Focusing on weight loss.   5. Hyperlipidemia on Vytorin. Excellent control  6. Edema. Resolved.   7. Hepatic cirrhosis. Probably related to fatty liver.   Follow up in one year

## 2020-12-10 DIAGNOSIS — M545 Low back pain, unspecified: Secondary | ICD-10-CM | POA: Diagnosis not present

## 2020-12-10 DIAGNOSIS — M25662 Stiffness of left knee, not elsewhere classified: Secondary | ICD-10-CM | POA: Diagnosis not present

## 2020-12-10 DIAGNOSIS — M25562 Pain in left knee: Secondary | ICD-10-CM | POA: Diagnosis not present

## 2020-12-11 ENCOUNTER — Ambulatory Visit: Payer: PPO | Admitting: Cardiology

## 2020-12-11 ENCOUNTER — Other Ambulatory Visit: Payer: Self-pay

## 2020-12-11 ENCOUNTER — Encounter: Payer: Self-pay | Admitting: Cardiology

## 2020-12-11 VITALS — BP 126/80 | HR 103 | Ht 73.0 in | Wt 277.0 lb

## 2020-12-11 DIAGNOSIS — E785 Hyperlipidemia, unspecified: Secondary | ICD-10-CM | POA: Diagnosis not present

## 2020-12-11 DIAGNOSIS — I10 Essential (primary) hypertension: Secondary | ICD-10-CM | POA: Diagnosis not present

## 2020-12-11 DIAGNOSIS — I482 Chronic atrial fibrillation, unspecified: Secondary | ICD-10-CM | POA: Diagnosis not present

## 2020-12-24 DIAGNOSIS — M25562 Pain in left knee: Secondary | ICD-10-CM | POA: Diagnosis not present

## 2020-12-24 DIAGNOSIS — M25662 Stiffness of left knee, not elsewhere classified: Secondary | ICD-10-CM | POA: Diagnosis not present

## 2020-12-24 DIAGNOSIS — M545 Low back pain, unspecified: Secondary | ICD-10-CM | POA: Diagnosis not present

## 2020-12-28 DIAGNOSIS — Z7901 Long term (current) use of anticoagulants: Secondary | ICD-10-CM | POA: Diagnosis not present

## 2020-12-28 DIAGNOSIS — I10 Essential (primary) hypertension: Secondary | ICD-10-CM | POA: Diagnosis not present

## 2020-12-28 DIAGNOSIS — E785 Hyperlipidemia, unspecified: Secondary | ICD-10-CM | POA: Diagnosis not present

## 2020-12-28 DIAGNOSIS — E1169 Type 2 diabetes mellitus with other specified complication: Secondary | ICD-10-CM | POA: Diagnosis not present

## 2020-12-28 DIAGNOSIS — I480 Paroxysmal atrial fibrillation: Secondary | ICD-10-CM | POA: Diagnosis not present

## 2021-01-11 ENCOUNTER — Ambulatory Visit (INDEPENDENT_AMBULATORY_CARE_PROVIDER_SITE_OTHER): Payer: PPO | Admitting: Podiatry

## 2021-01-11 ENCOUNTER — Other Ambulatory Visit: Payer: Self-pay

## 2021-01-11 ENCOUNTER — Ambulatory Visit (INDEPENDENT_AMBULATORY_CARE_PROVIDER_SITE_OTHER): Payer: PPO

## 2021-01-11 ENCOUNTER — Encounter: Payer: Self-pay | Admitting: Podiatry

## 2021-01-11 DIAGNOSIS — M76821 Posterior tibial tendinitis, right leg: Secondary | ICD-10-CM | POA: Diagnosis not present

## 2021-01-11 DIAGNOSIS — M21619 Bunion of unspecified foot: Secondary | ICD-10-CM | POA: Diagnosis not present

## 2021-01-11 DIAGNOSIS — M76822 Posterior tibial tendinitis, left leg: Secondary | ICD-10-CM

## 2021-01-11 DIAGNOSIS — E1169 Type 2 diabetes mellitus with other specified complication: Secondary | ICD-10-CM | POA: Diagnosis not present

## 2021-01-11 DIAGNOSIS — R600 Localized edema: Secondary | ICD-10-CM | POA: Diagnosis not present

## 2021-01-11 NOTE — Progress Notes (Signed)
Subjective:   Patient ID: Dan [name], male   DOB: 70 y.o.   MRN: [id number]   HPI Patient presents stating my legs are doing pretty good my ankles are doing okay I am getting swelling around them and I am wondering if there is anything we can do about that   ROS      Objective:  Physical Exam  Neurovascular status intact with patient having probable venous disease negative Bevelyn Buckles' sign but just chronic with moderate flatfoot deformity and chronic posterior tibial tendinitis     Assessment:  Moderate flatfoot deformity leading to stress with multiple signs of venous disease     Plan:  H&P reviewed condition discussed venous disease discussed posterior tibial tendinitis and at this point I dispensed ankle compression stockings to try to help control the swelling process present.  Patient will be seen back as needed may still require treatment and I reviewed x-rays  X-rays indicate slightly further diminishment of the arch height bilateral with multiple signs of arthritis around the subtalar midtarsal joint bilateral

## 2021-03-09 DIAGNOSIS — G4733 Obstructive sleep apnea (adult) (pediatric): Secondary | ICD-10-CM | POA: Diagnosis not present

## 2021-03-09 DIAGNOSIS — H9313 Tinnitus, bilateral: Secondary | ICD-10-CM | POA: Diagnosis not present

## 2021-03-09 DIAGNOSIS — J0141 Acute recurrent pansinusitis: Secondary | ICD-10-CM | POA: Diagnosis not present

## 2021-03-09 DIAGNOSIS — H903 Sensorineural hearing loss, bilateral: Secondary | ICD-10-CM | POA: Diagnosis not present

## 2021-04-22 DIAGNOSIS — G4733 Obstructive sleep apnea (adult) (pediatric): Secondary | ICD-10-CM | POA: Diagnosis not present

## 2021-04-28 DIAGNOSIS — I10 Essential (primary) hypertension: Secondary | ICD-10-CM | POA: Diagnosis not present

## 2021-04-28 DIAGNOSIS — E1169 Type 2 diabetes mellitus with other specified complication: Secondary | ICD-10-CM | POA: Diagnosis not present

## 2021-04-28 DIAGNOSIS — I519 Heart disease, unspecified: Secondary | ICD-10-CM | POA: Diagnosis not present

## 2021-04-28 DIAGNOSIS — Z7901 Long term (current) use of anticoagulants: Secondary | ICD-10-CM | POA: Diagnosis not present

## 2021-04-28 DIAGNOSIS — I480 Paroxysmal atrial fibrillation: Secondary | ICD-10-CM | POA: Diagnosis not present

## 2021-05-04 DIAGNOSIS — G4733 Obstructive sleep apnea (adult) (pediatric): Secondary | ICD-10-CM | POA: Diagnosis not present

## 2021-05-27 DIAGNOSIS — G4733 Obstructive sleep apnea (adult) (pediatric): Secondary | ICD-10-CM | POA: Diagnosis not present

## 2021-06-24 DIAGNOSIS — G4733 Obstructive sleep apnea (adult) (pediatric): Secondary | ICD-10-CM | POA: Diagnosis not present

## 2021-07-05 DIAGNOSIS — R269 Unspecified abnormalities of gait and mobility: Secondary | ICD-10-CM | POA: Diagnosis not present

## 2021-07-05 DIAGNOSIS — G4733 Obstructive sleep apnea (adult) (pediatric): Secondary | ICD-10-CM | POA: Diagnosis not present

## 2021-07-05 DIAGNOSIS — R2689 Other abnormalities of gait and mobility: Secondary | ICD-10-CM | POA: Diagnosis not present

## 2021-07-07 ENCOUNTER — Other Ambulatory Visit: Payer: Self-pay

## 2021-07-07 DIAGNOSIS — I10 Essential (primary) hypertension: Secondary | ICD-10-CM

## 2021-07-07 DIAGNOSIS — I482 Chronic atrial fibrillation, unspecified: Secondary | ICD-10-CM

## 2021-07-07 NOTE — Telephone Encounter (Signed)
ROUTING TO PHARMD FOR ORDERS Prescription refill request for Eliquis received. Indication:AFIB Last office visit:JORDAN 12/11/20 Scr:0.900 mg/ 07/31/2019 OVERDUE 2 YRS Age: 45M [id number]:404.6KG

## 2021-07-08 NOTE — Telephone Encounter (Signed)
This encounter was created in error - please disregard.

## 2021-07-09 NOTE — Telephone Encounter (Signed)
Please order CMP and CBC.  Refill one month supply.

## 2021-07-12 ENCOUNTER — Telehealth: Payer: Self-pay | Admitting: Cardiology

## 2021-07-12 MED ORDER — APIXABAN 5 MG PO TABS: 5.0000 mg | ORAL_TABLET | Freq: Two times a day (BID) | ORAL | 0 refills | Status: DC

## 2021-07-12 NOTE — Telephone Encounter (Signed)
Patient called in wanting to speak with the nurse. [facility] says they cant get his prescription because Dr Martinique hasnt written a new prescription. Please advise

## 2021-07-12 NOTE — Telephone Encounter (Signed)
Returned call to patient who states that he was calling in regard to his Eliquis prescription. Patient states that he was told by his pharmacy to call us for refill. Advised patient I would forward message to our PharmD for review and refill. Patient verbalized understanding.

## 2021-07-12 NOTE — Telephone Encounter (Signed)
ONE MONTH REFILLED AND LABS ORDERED AND RELEASED AS REQUESTED

## 2021-07-12 NOTE — Telephone Encounter (Signed)
Unable to lmom pt that they need labs but sent in a one month refill to get them by until the labs can de done

## 2021-07-24 DIAGNOSIS — G4733 Obstructive sleep apnea (adult) (pediatric): Secondary | ICD-10-CM | POA: Diagnosis not present

## 2021-08-01 ENCOUNTER — Other Ambulatory Visit: Payer: Self-pay | Admitting: Cardiology

## 2021-08-08 ENCOUNTER — Other Ambulatory Visit: Payer: Self-pay | Admitting: Cardiology

## 2021-08-09 NOTE — Telephone Encounter (Signed)
Prescription refill request for Eliquis received. Indication:Afib Last office visit:3/22 [id number] labs Age: 70 Weight:125.6 kg  Prescription refilled

## 2021-08-22 ENCOUNTER — Telehealth: Payer: Self-pay | Admitting: Cardiology

## 2021-08-22 DIAGNOSIS — J019 Acute sinusitis, unspecified: Secondary | ICD-10-CM | POA: Diagnosis not present

## 2021-08-23 NOTE — Telephone Encounter (Signed)
Prescription refill request for Eliquis received. Indication:Afib Last office visit:3/22 QFD:[id number] labs Age: 70 Weight:125.6 kg  Prescription refilled

## 2021-08-24 ENCOUNTER — Telehealth: Payer: Self-pay | Admitting: Cardiology

## 2021-08-24 DIAGNOSIS — G4733 Obstructive sleep apnea (adult) (pediatric): Secondary | ICD-10-CM | POA: Diagnosis not present

## 2021-08-24 NOTE — Telephone Encounter (Signed)
*  STAT* If patient is at the pharmacy, call can be transferred to refill team.   1. Which medications need to be refilled? (please list name of each medication and dose if known) ELIQUIS 5 MG TABS tablet  2. Which pharmacy/location (including street and city if local pharmacy) is medication to be sent to? [id number] - [place], [place] - [address] AT [address]  3. Do they need a 30 day or 90 day supply? [place]

## 2021-08-25 DIAGNOSIS — E1169 Type 2 diabetes mellitus with other specified complication: Secondary | ICD-10-CM | POA: Diagnosis not present

## 2021-08-25 DIAGNOSIS — I10 Essential (primary) hypertension: Secondary | ICD-10-CM | POA: Diagnosis not present

## 2021-08-25 DIAGNOSIS — Z125 Encounter for screening for malignant neoplasm of prostate: Secondary | ICD-10-CM | POA: Diagnosis not present

## 2021-08-25 DIAGNOSIS — E785 Hyperlipidemia, unspecified: Secondary | ICD-10-CM | POA: Diagnosis not present

## 2021-09-01 DIAGNOSIS — Z1339 Encounter for screening examination for other mental health and behavioral disorders: Secondary | ICD-10-CM | POA: Diagnosis not present

## 2021-09-01 DIAGNOSIS — I480 Paroxysmal atrial fibrillation: Secondary | ICD-10-CM | POA: Diagnosis not present

## 2021-09-01 DIAGNOSIS — R82998 Other abnormal findings in urine: Secondary | ICD-10-CM | POA: Diagnosis not present

## 2021-09-01 DIAGNOSIS — E785 Hyperlipidemia, unspecified: Secondary | ICD-10-CM | POA: Diagnosis not present

## 2021-09-01 DIAGNOSIS — I519 Heart disease, unspecified: Secondary | ICD-10-CM | POA: Diagnosis not present

## 2021-09-01 DIAGNOSIS — Z1331 Encounter for screening for depression: Secondary | ICD-10-CM | POA: Diagnosis not present

## 2021-09-01 DIAGNOSIS — I10 Essential (primary) hypertension: Secondary | ICD-10-CM | POA: Diagnosis not present

## 2021-09-01 DIAGNOSIS — Z0000 Encounter for general adult medical examination without abnormal findings: Secondary | ICD-10-CM | POA: Diagnosis not present

## 2021-09-01 DIAGNOSIS — E669 Obesity, unspecified: Secondary | ICD-10-CM | POA: Diagnosis not present

## 2021-09-01 DIAGNOSIS — E1169 Type 2 diabetes mellitus with other specified complication: Secondary | ICD-10-CM | POA: Diagnosis not present

## 2021-09-06 NOTE — Telephone Encounter (Signed)
Lpm to come in and have labs drawn in order to increase Eliquis amount of tablets.

## 2021-09-06 NOTE — Telephone Encounter (Signed)
*  STAT* If patient is at the pharmacy, call can be transferred to refill team.   1. Which medications need to be refilled? (please list name of each medication and dose if known) Eliquis  2. Which pharmacy/location (including street and city if local pharmacy) is medication to be sent to [facility], [place]  3. Do they need a 30 day or 90 day supply? Patient says he need #180- he takes 2 pills a day

## 2021-09-08 ENCOUNTER — Other Ambulatory Visit: Payer: Self-pay | Admitting: Cardiology

## 2021-09-09 NOTE — Telephone Encounter (Signed)
Prescription refill request for Eliquis received. Indication:Afib Last office visit:3/22 ATV:VLRTJ Labs Age: 70 Weight:125.6 kg  Prescription refilled

## 2021-09-10 DIAGNOSIS — H524 Presbyopia: Secondary | ICD-10-CM | POA: Diagnosis not present

## 2021-09-10 DIAGNOSIS — D3131 Benign neoplasm of right choroid: Secondary | ICD-10-CM | POA: Diagnosis not present

## 2021-09-10 DIAGNOSIS — E119 Type 2 diabetes mellitus without complications: Secondary | ICD-10-CM | POA: Diagnosis not present

## 2021-09-10 DIAGNOSIS — H40023 Open angle with borderline findings, high risk, bilateral: Secondary | ICD-10-CM | POA: Diagnosis not present

## 2021-09-13 ENCOUNTER — Telehealth: Payer: Self-pay | Admitting: Cardiology

## 2021-09-13 NOTE — Telephone Encounter (Signed)
Patient called check to see if his labs been sent over so that he can get refill on his Eliquis. Please advise

## 2021-09-13 NOTE — Telephone Encounter (Signed)
I do not see any recent lab results in [place] or [place]. Left message for pt to call back to see if we can get a phone # to where he had labs drawn or if they need our fax # to send them to.

## 2021-09-14 DIAGNOSIS — Z1212 Encounter for screening for malignant neoplasm of rectum: Secondary | ICD-10-CM | POA: Diagnosis not present

## 2021-09-16 NOTE — Telephone Encounter (Signed)
The only lab work I can see in [place] is an A1c in November. We can refill his Eliquis but he should have a BMET and CBC done if not done by primary  Merry Pond Martinique MD, [facility]

## 2021-09-16 NOTE — Telephone Encounter (Signed)
Patient calling, stating that he had his blood work completed with his primary care- he had them send it over.  He states the Eliquis is not being sent into the pharmacy- he is out after tomorrow.  He states he needs his medications- will route to MD to review if okay to send in?   Thanks!  I advised I would route to primary nurse as well to see if labs were received.

## 2021-09-16 NOTE — Telephone Encounter (Signed)
Patient is following up, requesting to speak with Dr. Doug Sou nurse regarding this matter. He states he had the labs sent to the office on December 8th with [facility]. Please return call to discuss when able.

## 2021-09-17 MED ORDER — APIXABAN 5 MG PO TABS: ORAL_TABLET | ORAL | 3 refills | Status: DC

## 2021-09-17 NOTE — Telephone Encounter (Signed)
Spoke to patient Dan [name] refill sent to pharmacy.I will have Dr.Aronson's office fax over your bmet and cbc done in 12/22.

## 2021-09-23 DIAGNOSIS — G4733 Obstructive sleep apnea (adult) (pediatric): Secondary | ICD-10-CM | POA: Diagnosis not present

## 2021-10-08 DIAGNOSIS — G4733 Obstructive sleep apnea (adult) (pediatric): Secondary | ICD-10-CM | POA: Diagnosis not present

## 2021-10-24 DIAGNOSIS — G4733 Obstructive sleep apnea (adult) (pediatric): Secondary | ICD-10-CM | POA: Diagnosis not present

## 2021-10-27 DIAGNOSIS — Z96652 Presence of left artificial knee joint: Secondary | ICD-10-CM | POA: Diagnosis not present

## 2021-10-27 DIAGNOSIS — M25562 Pain in left knee: Secondary | ICD-10-CM | POA: Diagnosis not present

## 2021-11-01 ENCOUNTER — Other Ambulatory Visit: Payer: Self-pay

## 2021-11-01 ENCOUNTER — Encounter: Payer: Self-pay | Admitting: Physical Therapy

## 2021-11-01 ENCOUNTER — Ambulatory Visit: Payer: PPO | Attending: Specialist | Admitting: Physical Therapy

## 2021-11-01 DIAGNOSIS — R2689 Other abnormalities of gait and mobility: Secondary | ICD-10-CM

## 2021-11-01 DIAGNOSIS — M25562 Pain in left knee: Secondary | ICD-10-CM | POA: Diagnosis not present

## 2021-11-01 DIAGNOSIS — R29898 Other symptoms and signs involving the musculoskeletal system: Secondary | ICD-10-CM

## 2021-11-01 DIAGNOSIS — G8929 Other chronic pain: Secondary | ICD-10-CM | POA: Diagnosis not present

## 2021-11-01 DIAGNOSIS — R262 Difficulty in walking, not elsewhere classified: Secondary | ICD-10-CM

## 2021-11-01 DIAGNOSIS — M6281 Muscle weakness (generalized): Secondary | ICD-10-CM

## 2021-11-01 DIAGNOSIS — M25662 Stiffness of left knee, not elsewhere classified: Secondary | ICD-10-CM | POA: Diagnosis not present

## 2021-11-01 NOTE — Therapy (Signed)
[facility] [address]  [place], [place], [address] Phone: [phone]   Fax:  [phone]  Physical Therapy Evaluation  Patient Details  Name: Dan [name] MRN: [id number] Date of Birth: 1950/12/01 Referring Provider (PT): Donnald Garre, MD   Encounter Date: 11/01/2021   PT End of Session - 11/01/21 1617     Visit Number 1    Number of Visits 12    Date for PT Re-Evaluation 12/13/21    Authorization Type [employer]    PT Start Time 1617    PT Stop Time 1707    PT Time Calculation (min) 50 min    Activity Tolerance Patient tolerated treatment well    Behavior During Therapy [facility] for tasks assessed/performed             Past Medical History:  Diagnosis Date   Arthritis    osteoarthritis left knee   BPH (benign prostatic hypertrophy)    Cholelithiasis    Chronic atrial fibrillation ([place])    Colon polyps    adenomatous   Diabetes mellitus    Diverticulosis    Dysrhythmia    tx. Pradaxa- Dr. Martinique follows   Elevated LFTs    GERD (gastroesophageal reflux disease)    Hemorrhoids    Hyperlipidemia    Hypertension    Morbid obesity ([place])    Sleep apnea    CPAP nightly   Tubular adenoma of colon     Past Surgical History:  Procedure Laterality Date   KNEE ARTHROSCOPY Left 03/28/2014   Procedure: LEFT KNEE ARTHROSCOPY WITH DEBRIDEMENT  ;  Surgeon: Johnn Hai, MD;  Location: [place];  Service: Orthopedics;  Laterality: Left;   TONSILLECTOMY     TOTAL KNEE ARTHROPLASTY Left 04/30/2015   Procedure: LEFT TOTAL KNEE ARTHROPLASTY;  Surgeon: Susa Day, MD;  Location[facility];  Service: Orthopedics;  Laterality: Left;   US ECHOCARDIOGRAPHY  08/08/2006   ef 55-60%    There were no vitals filed for this visit.    Subjective Assessment - 11/01/21 1620     Subjective Pt reports pain in the lateral side of his L knee (s/p TKA 6.5 yrs ago) which causes him to struggle to  go up/down stairs - requires heavy use of rail and has slipped and gone down at times w/o rail. Imaging revealed some inflammation, therefore cortisone shots given with good relief - only slight pain now.    Pertinent History L TKA 04/30/15    How long can you sit comfortably? pain worse during sit to stand after prolonged sitting    How long can you stand comfortably? 45 minutes    How long can you walk comfortably? 1 mile - 8-10 minutes    Patient Stated Goals "be able to get my L leg more stabilized for better comfort/confidence on stairs"    Currently in Pain? Yes    Pain Score 1     Pain Location Knee    Pain Orientation Left    Pain Descriptors / Indicators Tender;Sore   to touch   Pain Type Chronic pain    Pain Onset Other (comment)   since TKA - worsening over past several months   Pain Frequency Constant    Aggravating Factors  walking, climbing stairs, sit <> stand transfers    Pain Relieving Factors cortisone injections    Effect of Pain on Daily Activities relies heavily on railing with climbing stairs, walking limited to  1 mile rather than the 3 miles he used to walk, favors L leg during transfers                [facility] PT Assessment - 11/01/21 1617       Assessment   Medical Diagnosis L lateral knee pain    Referring Provider (PT) Donnald Garre, MD    Onset Date/Surgical Date 04/30/15   6.5 yrs ago s/p TKA   Hand Dominance Right    Next MD Visit 11/23/21    Prior Therapy PT after TKA      Precautions   Precautions Fall      Restrictions   Weight Bearing Restrictions No      Balance Screen   Has the patient fallen in the past 6 months Yes    How many times? 2    Has the patient had a decrease in activity level because of a fear of falling?  Yes    Is the patient reluctant to leave their home because of a fear of falling?  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    Leisure walking 1 mile a couple times/week; golf      Cognition   Overall Cognitive Status Within Functional Limits for tasks assessed      Observation/Other Assessments   Focus on Therapeutic Outcomes (FOTO)  Knee = 66 (risk adjusted = 39); predicted D/C FS =67      ROM / Strength   AROM / PROM / Strength AROM;PROM;Strength      AROM   AROM Assessment Site Knee    Right/Left Knee Right;Left    Right Knee Extension 0    Left Knee Extension -3   0 when supported in supine   Left Knee Flexion 119      Strength   Strength Assessment Site Hip;Knee;Ankle    Right/Left Hip Right;Left    Right Hip Flexion 4/5    Right Hip Extension 4-/5    Right Hip External Rotation  4/5    Right Hip Internal Rotation 5/5    Right Hip ABduction 4+/5    Right Hip ADduction 4-/5    Left Hip Flexion 4/5    Left Hip Extension 4-/5    Left Hip External Rotation 4-/5   limited ROM   Left Hip Internal Rotation 5/5    Left Hip ABduction 4/5    Left Hip ADduction 3+/5    Right/Left Knee Right;Left    Right Knee Flexion 4+/5    Right Knee Extension 4+/5    Left Knee Flexion 4/5    Left Knee Extension 4+/5    Right/Left Ankle Right;Left    Right Ankle Dorsiflexion 4/5    Left Ankle Dorsiflexion 4+/5      Flexibility   Soft Tissue Assessment /Muscle Length yes    Hamstrings mod tight L>R    Quadriceps mod/severe tight L>R - quads and hip flexors    ITB severe  tight L>R    Piriformis mod/severe tight L>R                        Objective measurements completed on examination: See above findings.       [facility] Adult PT Treatment/Exercise - 11/01/21  1617       Exercises   Exercises Knee/Hip      Knee/Hip Exercises: Stretches   Passive Hamstring Stretch Left;1 rep;30 seconds   pt to perform all stretches bilaterally at home 3 x 30 sec   Passive Hamstring Stretch Limitations seated hip hinge    Hip Flexor Stretch Left;2 reps;30 seconds    Hip Flexor Stretch Limitations seated lunge position over edge of chair & standing lunge position with opp foot on step - cues for upright torso/back straight    ITB Stretch Left;2 reps;30 seconds    ITB Stretch Limitations standing lateral lean over counter    Piriformis Stretch Left;1 rep;30 seconds    Piriformis Stretch Limitations side-sitting hip hinge                     PT Education - 11/01/21 1707     Education Details PT eval findings, anticipated POC and initial HEP - Access Code: [email or web address]    Person(s) Educated Patient    Methods Explanation;Demonstration;Verbal cues;Tactile cues;Handout    Comprehension Verbalized understanding;Verbal cues required;Tactile cues required;Returned demonstration;Need further instruction              PT Short Term Goals - 11/01/21 1707       PT SHORT TERM GOAL #1   Title Patient will be independent with initial HEP    Status New    Target Date 11/15/21               PT Long Term Goals - 11/01/21 1707       PT LONG TERM GOAL #1   Title Patient will be independent with ongoing/advanced HEP for self-management at home    Status New    Target Date 12/13/21      PT LONG TERM GOAL #2   Title Patient to demonstrate improved tissue quality and pliability as noted by reduced tissue tightness and tenderness to palpation    Status New    Target Date 12/13/21      PT LONG TERM GOAL #3   Title Patient will demonstrate improved L LE strength to >/= 4+/5 for improved stability and ease of mobility    Status New    Target Date 12/13/21      PT LONG TERM GOAL #4   Title Patient will complete sit to stand transition with even  weight bearing (no favoring of L LE) and no increased L knee pain    Status New    Target Date 12/13/21      PT LONG TERM GOAL #5   Title Patient will negotiate stairs reciprocally with normal step pattern and minimal to no use of railing w/o limitation due to L knee pain or weakness    Status New    Target Date 12/13/21                    Plan - 11/01/21 1707     Clinical Impression Statement Dan [name] is a 71 y/o male who presents to OP PT for lateral L knee pain 6  years s/p L TKA. He states his knee has never felt quite right since the TKA but  recently has been experiencing worsening L lateral knee pain which limits his ability to climb stairs, prevents him from walking as long as he would like to for exercise and causes him to favor his L leg during sit to stand transitions. He has had some instances where he has fallen due to his L knee when navigating stairs w/o a railing. Pain has been better since cortisone injections but tenderness/soreness to palpation still present along with feeling of instability in L knee. Current deficits include L lateral knee pain, moderate to severe tightness in B proximal LE muscle groups (L>R), and mild to moderate proximal LE weakness. [place] will benefit from skilled PT intervention to address the above listed deficits, reduce pain, and improve functional L LE strength to allow for improved knee stability for improved balance and activity tolerance to maximize function and safety with mobility in home and community.    Personal Factors and Comorbidities Age;Comorbidity 3+;Fitness;Past/Current Experience;Time since onset of injury/illness/exacerbation;Profession    Comorbidities L TKA 04/30/15, chronic a-fib, HTN, borderline DM, OSA, lumbar DDD, HLD, B tinnitus    Examination-Activity Limitations Transfers;Locomotion Level;Stand;Squat;Stairs    Examination-Participation Restrictions Cleaning;Community Activity;Occupation;Shop;Yard Work     Stability/Clinical Decision Making Stable/Uncomplicated    Clinical Decision Making Low    Rehab Potential Good    PT Frequency 2x / week    PT Duration 6 weeks    PT Treatment/Interventions ADLs/Self Care Home Management;Cryotherapy;Electrical Stimulation;Iontophoresis 4mg /ml Dexamethasone;Moist Heat;Ultrasound;Gait training;Stair training;Functional mobility training;Therapeutic activities;Therapeutic exercise;Balance training;Neuromuscular re-education;Patient/family education;Manual techniques;Scar mobilization;Passive range of motion;Dry needling;Taping;Vasopneumatic Device    PT Next Visit Plan Review initial HEP; MT and stretch to address proximal LE tightness; hip/glute strengthening; modalities PRN    PT Home Exercise Plan Access Code: [email or web address] (1/30)    Consulted and Agree with Plan of Care Patient             Patient will benefit from skilled therapeutic intervention in order to improve the following deficits and impairments:  Abnormal gait, Decreased activity tolerance, Decreased balance, Decreased mobility, Decreased range of motion, Decreased strength, Increased fascial restricitons, Increased muscle spasms, Impaired perceived functional ability, Impaired flexibility, Improper body mechanics, Postural dysfunction, Pain  Visit Diagnosis: Chronic pain of left knee  Stiffness of left knee, not elsewhere classified  Other symptoms and signs involving the musculoskeletal system  Muscle weakness (generalized)  Other abnormalities of gait and mobility  Difficulty in walking, not elsewhere classified     Problem List Patient Active Problem List   Diagnosis Date Noted   Tinnitus, bilateral 08/08/2018   Lumbar pain 02/23/2018   Degeneration of lumbar intervertebral disc 02/23/2018   Increased body mass index 02/23/2018   History of total knee arthroplasty 10/24/2017   Osteoarthritis 10/24/2017   Acute recurrent pansinusitis 09/13/2016   Chronic rhinitis 09/13/2016    Obstructive sleep apnea 09/13/2016   Primary osteoarthritis of left knee 04/30/2015   Left knee DJD 04/30/2015   Pneumonia 11/02/2014   Hypertension    Diabetes mellitus ([place])    Hyperlipidemia    OTHER AND UNSPECIFIED COAGULATION DEFECTS 09/28/2010   Chronic a-fib ([place]) 09/28/2010   PERSONAL HX COLONIC POLYPS 09/28/2010    Percival Spanish, PT 11/01/2021, 5:48 PM  [facility] [address]  [place], [place], [address] Phone: [phone]   Fax:  [phone]  Name: Dan [name] MRN: [id number] Date of Birth: Feb 10, 1951

## 2021-11-01 NOTE — Patient Instructions (Signed)
° ° °  Access Code: [email or web address] URL: [email or web address] Date: 11/01/2021 Prepared by: Annie Paras  Exercises Seated Hamstring Stretch (Mirrored) - 2-3 x daily - 7 x weekly - 3 reps - 30 sec hold Seated Table Piriformis Stretch - 2-3 x daily - 7 x weekly - 3 reps - 30 sec hold Seated Hip Flexor Stretch (Mirrored) - 2-3 x daily - 7 x weekly - 3 reps - 30 sec hold Hip Flexor Stretch on Step - 2-3 x daily - 7 x weekly - 3 reps - 30 sec hold Standing ITB Stretch - 2-3 x daily - 7 x weekly - 3 reps - 30 sec hold

## 2021-11-04 ENCOUNTER — Ambulatory Visit: Payer: PPO | Attending: Specialist

## 2021-11-04 ENCOUNTER — Other Ambulatory Visit: Payer: Self-pay

## 2021-11-04 DIAGNOSIS — R29898 Other symptoms and signs involving the musculoskeletal system: Secondary | ICD-10-CM | POA: Diagnosis not present

## 2021-11-04 DIAGNOSIS — R2689 Other abnormalities of gait and mobility: Secondary | ICD-10-CM

## 2021-11-04 DIAGNOSIS — M6281 Muscle weakness (generalized): Secondary | ICD-10-CM

## 2021-11-04 DIAGNOSIS — M25562 Pain in left knee: Secondary | ICD-10-CM | POA: Insufficient documentation

## 2021-11-04 DIAGNOSIS — G8929 Other chronic pain: Secondary | ICD-10-CM

## 2021-11-04 DIAGNOSIS — R262 Difficulty in walking, not elsewhere classified: Secondary | ICD-10-CM

## 2021-11-04 DIAGNOSIS — M25662 Stiffness of left knee, not elsewhere classified: Secondary | ICD-10-CM | POA: Diagnosis not present

## 2021-11-04 NOTE — Therapy (Signed)
[facility] [address]  [place], [place], [address] Phone: [phone]   Fax:  [phone]  Physical Therapy Treatment  Patient Details  Name: Dan [name] MRN: [id number] Date of Birth: 1951-06-04 Referring Provider (PT): Donnald Garre, MD   Encounter Date: 11/04/2021   PT End of Session - 11/04/21 1752     Visit Number 2    Number of Visits 12    Date for PT Re-Evaluation 12/13/21    Authorization Type [employer]    PT Start Time 1703    PT Stop Time 1742    PT Time Calculation (min) 39 min    Activity Tolerance Patient tolerated treatment well    Behavior During Therapy [facility] for tasks assessed/performed             Past Medical History:  Diagnosis Date   Arthritis    osteoarthritis left knee   BPH (benign prostatic hypertrophy)    Cholelithiasis    Chronic atrial fibrillation ([place])    Colon polyps    adenomatous   Diabetes mellitus    Diverticulosis    Dysrhythmia    tx. Pradaxa- Dr. [place] follows   Elevated LFTs    GERD (gastroesophageal reflux disease)    Hemorrhoids    Hyperlipidemia    Hypertension    Morbid obesity ([place])    Sleep apnea    CPAP nightly   Tubular adenoma of colon     Past Surgical History:  Procedure Laterality Date   KNEE ARTHROSCOPY Left 03/28/2014   Procedure: LEFT KNEE ARTHROSCOPY WITH DEBRIDEMENT  ;  Surgeon: Johnn Hai, MD;  Location: [facility];  Service: Orthopedics;  Laterality: Left;   TONSILLECTOMY     TOTAL KNEE ARTHROPLASTY Left 04/30/2015   Procedure: LEFT TOTAL KNEE ARTHROPLASTY;  Surgeon: Susa Day, MD;  [facility];  Service: Orthopedics;  Laterality: Left;   US ECHOCARDIOGRAPHY  08/08/2006   ef 55-60%    There were no vitals filed for this visit.   Subjective Assessment - 11/04/21 1705     Subjective Having some trouble with the piriformis stretch at home.    Pertinent History L TKA 04/30/15    Patient  Stated Goals "be able to get my L leg more stabilized for better comfort/confidence on stairs"    Currently in Pain? No/denies                               [facility] Adult PT Treatment/Exercise - 11/04/21 0001       Knee/Hip Exercises: Stretches   Active Hamstring Stretch Both;30 seconds    Active Hamstring Stretch Limitations seated hip hinge    Hip Flexor Stretch Right;Left;30 seconds    Hip Flexor Stretch Limitations seated lunge position over edge of chair    Piriformis Stretch Both;2 reps;30 seconds    Piriformis Stretch Limitations figure 4 with hip hinge    Other Knee/Hip Stretches standing ITB stretch side bend at wall x30 sec each way      Knee/Hip Exercises: Aerobic   Nustep L4x5min      Manual Therapy   Manual Therapy Soft tissue mobilization    Soft tissue mobilization STM to L ITB, VL, piriformis, glute med/min                       PT Short Term Goals - 11/01/21 1707  PT SHORT TERM GOAL #1   Title Patient will be independent with initial HEP    Status New    Target Date 11/15/21               PT Long Term Goals - 11/01/21 1707       PT LONG TERM GOAL #1   Title Patient will be independent with ongoing/advanced HEP for self-management at home    Status New    Target Date 12/13/21      PT LONG TERM GOAL #2   Title Patient to demonstrate improved tissue quality and pliability as noted by reduced tissue tightness and tenderness to palpation    Status New    Target Date 12/13/21      PT LONG TERM GOAL #3   Title Patient will demonstrate improved L LE strength to >/= 4+/5 for improved stability and ease of mobility    Status New    Target Date 12/13/21      PT LONG TERM GOAL #4   Title Patient will complete sit to stand transition with even weight bearing (no favoring of L LE) and no increased L knee pain    Status New    Target Date 12/13/21      PT LONG TERM GOAL #5   Title Patient will negotiate stairs  reciprocally with normal step pattern and minimal to no use of railing w/o limitation due to L knee pain or weakness    Status New    Target Date 12/13/21                   Plan - 11/04/21 1752     Clinical Impression Statement Reviewed HEP with pt, cuing given with the exercises for upright posture and for safety with the standing ITB stretch. Modified the piriformis stretch for him for comfort and better compliance. Gave instruction on the importance of stretches to decrease knee strain and to improve overall postural stability. Followed with STM, which he felt a good relief from it. Will plan to start hip strengthening next session.    Personal Factors and Comorbidities Age;Comorbidity 3+;Fitness;Past/Current Experience;Time since onset of injury/illness/exacerbation;Profession    Comorbidities L TKA 04/30/15, chronic a-fib, HTN, borderline DM, OSA, lumbar DDD, HLD, B tinnitus    PT Frequency 2x / week    PT Duration 6 weeks    PT Treatment/Interventions ADLs/Self Care Home Management;Cryotherapy;Electrical Stimulation;Iontophoresis 4mg /ml Dexamethasone;Moist Heat;Ultrasound;Gait training;Stair training;Functional mobility training;Therapeutic activities;Therapeutic exercise;Balance training;Neuromuscular re-education;Patient/family education;Manual techniques;Scar mobilization;Passive range of motion;Dry needling;Taping;Vasopneumatic Device    PT Next Visit Plan MT and stretch to address proximal LE tightness; hip/glute strengthening; modalities PRN    PT Home Exercise Plan Access Code: [email or web address] (1/30)    Consulted and Agree with Plan of Care Patient             Patient will benefit from skilled therapeutic intervention in order to improve the following deficits and impairments:  Abnormal gait, Decreased activity tolerance, Decreased balance, Decreased mobility, Decreased range of motion, Decreased strength, Increased fascial restricitons, Increased muscle spasms, Impaired  perceived functional ability, Impaired flexibility, Improper body mechanics, Postural dysfunction, Pain  Visit Diagnosis: Chronic pain of left knee  Stiffness of left knee, not elsewhere classified  Other symptoms and signs involving the musculoskeletal system  Muscle weakness (generalized)  Other abnormalities of gait and mobility  Difficulty in walking, not elsewhere classified     Problem List Patient Active Problem List   Diagnosis Date Noted   Tinnitus, bilateral  08/08/2018   Lumbar pain 02/23/2018   Degeneration of lumbar intervertebral disc 02/23/2018   Increased body mass index 02/23/2018   History of total knee arthroplasty 10/24/2017   Osteoarthritis 10/24/2017   Acute recurrent pansinusitis 09/13/2016   Chronic rhinitis 09/13/2016   Obstructive sleep apnea 09/13/2016   Primary osteoarthritis of left knee 04/30/2015   Left knee DJD 04/30/2015   Pneumonia 11/02/2014   Hypertension    Diabetes mellitus ([place])    Hyperlipidemia    OTHER AND UNSPECIFIED COAGULATION DEFECTS 09/28/2010   Chronic a-fib ([place]) 09/28/2010   PERSONAL HX COLONIC POLYPS 09/28/2010    Artist Pais, PTA 11/04/2021, 5:57 PM  [facility] [address]  [place], [place], [address] Phone: [phone]   Fax:  [phone]  Name: Dan [name] MRN: [id number] Date of Birth: [birth year]

## 2021-11-09 ENCOUNTER — Other Ambulatory Visit: Payer: Self-pay

## 2021-11-09 ENCOUNTER — Ambulatory Visit: Payer: PPO

## 2021-11-09 DIAGNOSIS — M25562 Pain in left knee: Secondary | ICD-10-CM

## 2021-11-09 DIAGNOSIS — M25662 Stiffness of left knee, not elsewhere classified: Secondary | ICD-10-CM

## 2021-11-09 DIAGNOSIS — R262 Difficulty in walking, not elsewhere classified: Secondary | ICD-10-CM

## 2021-11-09 DIAGNOSIS — R29898 Other symptoms and signs involving the musculoskeletal system: Secondary | ICD-10-CM

## 2021-11-09 DIAGNOSIS — M6281 Muscle weakness (generalized): Secondary | ICD-10-CM

## 2021-11-09 DIAGNOSIS — G8929 Other chronic pain: Secondary | ICD-10-CM

## 2021-11-09 DIAGNOSIS — R2689 Other abnormalities of gait and mobility: Secondary | ICD-10-CM

## 2021-11-09 NOTE — Patient Instructions (Signed)
Access Code: [email or web address] URL: [email or web address] Date: 11/09/2021 Prepared by: Clarene Essex  Exercises Seated Hamstring Stretch (Mirrored) - 2-3 x daily - 7 x weekly - 3 reps - 30 sec hold Seated Table Piriformis Stretch - 2-3 x daily - 7 x weekly - 3 reps - 30 sec hold Seated Hip Flexor Stretch (Mirrored) - 2-3 x daily - 7 x weekly - 3 reps - 30 sec hold Hip Flexor Stretch on Step - 2-3 x daily - 7 x weekly - 3 reps - 30 sec hold Standing ITB Stretch - 2-3 x daily - 7 x weekly - 3 reps - 30 sec hold Supine Bridge - 1 x daily - 7 x weekly - 2 sets - 10 reps Hooklying Isometric Clamshell - 1 x daily - 7 x weekly - 2 sets - 10 reps Supine Hip Adduction Isometric with Ball - 1 x daily - 7 x weekly - 2 sets - 10 reps - 3-5 sec hold

## 2021-11-09 NOTE — Therapy (Signed)
[facility] [address]  [place], [place], [address] Phone: [phone]   Fax:  [phone]  Physical Therapy Treatment  Patient Details  Name: Dan [name] MRN: [id number] Date of Birth: 19-Nov-1950 Referring Provider (PT): Donnald Garre, MD   Encounter Date: 11/09/2021   PT End of Session - 11/09/21 1622     Visit Number 3    Number of Visits 12    Date for PT Re-Evaluation 12/13/21    Authorization Type [employer]    PT Start Time 1534    PT Stop Time 1614    PT Time Calculation (min) 40 min    Activity Tolerance Patient tolerated treatment well    Behavior During Therapy [facility] for tasks assessed/performed             Past Medical History:  Diagnosis Date   Arthritis    osteoarthritis left knee   BPH (benign prostatic hypertrophy)    Cholelithiasis    Chronic atrial fibrillation ([place])    Colon polyps    adenomatous   Diabetes mellitus    Diverticulosis    Dysrhythmia    tx. Pradaxa- Dr. Martinique follows   Elevated LFTs    GERD (gastroesophageal reflux disease)    Hemorrhoids    Hyperlipidemia    Hypertension    Morbid obesity ([place])    Sleep apnea    CPAP nightly   Tubular adenoma of colon     Past Surgical History:  Procedure Laterality Date   KNEE ARTHROSCOPY Left 03/28/2014   Procedure: LEFT KNEE ARTHROSCOPY WITH DEBRIDEMENT  ;  Surgeon: Johnn Hai, MD;  Location: [place];  Service: Orthopedics;  Laterality: Left;   TONSILLECTOMY     TOTAL KNEE ARTHROPLASTY Left 04/30/2015   Procedure: LEFT TOTAL KNEE ARTHROPLASTY;  Surgeon: Susa Day, MD;  [facility];  Service: Orthopedics;  Laterality: Left;   US ECHOCARDIOGRAPHY  08/08/2006   ef 55-60%    There were no vitals filed for this visit.   Subjective Assessment - 11/09/21 1535     Subjective The muscles are still tight but after my exercises this morning they are looser.    Pertinent History L TKA  04/30/15    Patient Stated Goals "be able to get my L leg more stabilized for better comfort/confidence on stairs"    Currently in Pain? No/denies                               [facility] Adult PT Treatment/Exercise - 11/09/21 0001       Knee/Hip Exercises: Stretches   Active Hamstring Stretch Both;2 reps;30 seconds    Active Hamstring Stretch Limitations seated hip hinge      Knee/Hip Exercises: Aerobic   Recumbent Bike L1x46min      Knee/Hip Exercises: Standing   Other Standing Knee Exercises B 3 way hip strengthening (flex/ext/abd) with 2lb weight and 2HA 10 reps each      Knee/Hip Exercises: Seated   Long Arc Quad Strengthening;Both;2 sets;10 reps;Weights    Long Arc Quad Weight 2 lbs.    Long [employer] Limitations 1 set with and w/o ball squeeze      Knee/Hip Exercises: Supine   Hip Adduction Isometric Strengthening;Both;10 reps    Hip Adduction Isometric Limitations 3 sec hold    Bridges Strengthening;Both;10 reps    Other Supine Knee/Hip Exercises clamshell with GTB 10  reps R/L                     PT Education - 11/09/21 1559     Education Details initiated strengthening HEP    Person(s) Educated Patient    Methods Explanation;Demonstration;Verbal cues;Handout    Comprehension Verbalized understanding;Returned demonstration              PT Short Term Goals - 11/09/21 1750       PT SHORT TERM GOAL #1   Title Patient will be independent with initial HEP    Status On-going    Target Date 11/15/21               PT Long Term Goals - 11/09/21 1750       PT LONG TERM GOAL #1   Title Patient will be independent with ongoing/advanced HEP for self-management at home    Status On-going    Target Date 12/13/21      PT LONG TERM GOAL #2   Title Patient to demonstrate improved tissue quality and pliability as noted by reduced tissue tightness and tenderness to palpation    Status On-going    Target Date 12/13/21      PT LONG  TERM GOAL #3   Title Patient will demonstrate improved L LE strength to >/= 4+/5 for improved stability and ease of mobility    Status On-going    Target Date 12/13/21      PT LONG TERM GOAL #4   Title Patient will complete sit to stand transition with even weight bearing (no favoring of L LE) and no increased L knee pain    Status On-going    Target Date 12/13/21      PT LONG TERM GOAL #5   Title Patient will negotiate stairs reciprocally with normal step pattern and minimal to no use of railing w/o limitation due to L knee pain or weakness    Status On-going    Target Date 12/13/21                   Plan - 11/09/21 1710     Clinical Impression Statement Progressed strengthening to HEP today to improve proximal stability of the knee joints. Cues needed in standing to avoid postural leaning (trunk flexion) and to keep TKE with hip extension. Constance Haw were pretty difficult for him, as well as hip ADD, continue with hip strengthening.    Personal Factors and Comorbidities Age;Comorbidity 3+;Fitness;Past/Current Experience;Time since onset of injury/illness/exacerbation;Profession    Comorbidities L TKA 04/30/15, chronic a-fib, HTN, borderline DM, OSA, lumbar DDD, HLD, B tinnitus    PT Frequency 2x / week    PT Duration 6 weeks    PT Treatment/Interventions ADLs/Self Care Home Management;Cryotherapy;Electrical Stimulation;Iontophoresis 4mg /ml Dexamethasone;Moist Heat;Ultrasound;Gait training;Stair training;Functional mobility training;Therapeutic activities;Therapeutic exercise;Balance training;Neuromuscular re-education;Patient/family education;Manual techniques;Scar mobilization;Passive range of motion;Dry needling;Taping;Vasopneumatic Device    PT Next Visit Plan MT and stretch to address proximal LE tightness; hip/glute strengthening; modalities PRN    PT Home Exercise Plan Access Code: [email or web address] (1/30)    Consulted and Agree with Plan of Care Patient             Patient  will benefit from skilled therapeutic intervention in order to improve the following deficits and impairments:  Abnormal gait, Decreased activity tolerance, Decreased balance, Decreased mobility, Decreased range of motion, Decreased strength, Increased fascial restricitons, Increased muscle spasms, Impaired perceived functional ability, Impaired flexibility, Improper body mechanics, Postural dysfunction, Pain  Visit Diagnosis:  Chronic pain of left knee  Stiffness of left knee, not elsewhere classified  Other symptoms and signs involving the musculoskeletal system  Muscle weakness (generalized)  Other abnormalities of gait and mobility  Difficulty in walking, not elsewhere classified     Problem List Patient Active Problem List   Diagnosis Date Noted   Tinnitus, bilateral 08/08/2018   Lumbar pain 02/23/2018   Degeneration of lumbar intervertebral disc 02/23/2018   Increased body mass index 02/23/2018   History of total knee arthroplasty 10/24/2017   Osteoarthritis 10/24/2017   Acute recurrent pansinusitis 09/13/2016   Chronic rhinitis 09/13/2016   Obstructive sleep apnea 09/13/2016   Primary osteoarthritis of left knee 04/30/2015   Left knee DJD 04/30/2015   Pneumonia 11/02/2014   Hypertension    Diabetes mellitus ([place])    Hyperlipidemia    OTHER AND UNSPECIFIED COAGULATION DEFECTS 09/28/2010   Chronic a-fib ([place]) 09/28/2010   PERSONAL HX COLONIC POLYPS 09/28/2010    Artist Pais, PTA 11/09/2021, 5:51 PM  [facility] [address]  [place], [place], [address] Phone: [phone]   Fax:  [phone]  Name: REAGEN HABERMAN MRN: [id number] Date of Birth: May 15, 1951

## 2021-11-10 ENCOUNTER — Ambulatory Visit: Payer: PPO

## 2021-11-10 DIAGNOSIS — M25562 Pain in left knee: Secondary | ICD-10-CM

## 2021-11-10 DIAGNOSIS — R29898 Other symptoms and signs involving the musculoskeletal system: Secondary | ICD-10-CM

## 2021-11-10 DIAGNOSIS — G8929 Other chronic pain: Secondary | ICD-10-CM

## 2021-11-10 DIAGNOSIS — R262 Difficulty in walking, not elsewhere classified: Secondary | ICD-10-CM

## 2021-11-10 DIAGNOSIS — M6281 Muscle weakness (generalized): Secondary | ICD-10-CM

## 2021-11-10 DIAGNOSIS — M25662 Stiffness of left knee, not elsewhere classified: Secondary | ICD-10-CM

## 2021-11-10 DIAGNOSIS — R2689 Other abnormalities of gait and mobility: Secondary | ICD-10-CM

## 2021-11-10 NOTE — Therapy (Signed)
[facility] [address]  [place], [place], [address] Phone: [phone]   Fax:  [phone]  Physical Therapy Treatment  Patient Details  Name: Dan [name] MRN: [id number] Date of Birth: Jul 15, 1951 Referring Provider (PT): Donnald Garre, MD   Encounter Date: 11/10/2021   PT End of Session - 11/10/21 1657     Visit Number 4    Number of Visits 12    Date for PT Re-Evaluation 12/13/21    Authorization Type [employer]    PT Start Time 1616    PT Stop Time 1657    PT Time Calculation (min) 41 min    Activity Tolerance Patient tolerated treatment well    Behavior During Therapy [facility] for tasks assessed/performed             Past Medical History:  Diagnosis Date   Arthritis    osteoarthritis left knee   BPH (benign prostatic hypertrophy)    Cholelithiasis    Chronic atrial fibrillation ([place])    Colon polyps    adenomatous   Diabetes mellitus    Diverticulosis    Dysrhythmia    tx. Pradaxa- Dr. Martinique follows   Elevated LFTs    GERD (gastroesophageal reflux disease)    Hemorrhoids    Hyperlipidemia    Hypertension    Morbid obesity ([place])    Sleep apnea    CPAP nightly   Tubular adenoma of colon     Past Surgical History:  Procedure Laterality Date   KNEE ARTHROSCOPY Left 03/28/2014   Procedure: LEFT KNEE ARTHROSCOPY WITH DEBRIDEMENT  ;  Surgeon: Johnn Hai, MD;  Location: [place];  Service: Orthopedics;  Laterality: Left;   TONSILLECTOMY     TOTAL KNEE ARTHROPLASTY Left 04/30/2015   Procedure: LEFT TOTAL KNEE ARTHROPLASTY;  Surgeon: Susa Day, MD;  [facility];  Service: Orthopedics;  Laterality: Left;   US ECHOCARDIOGRAPHY  08/08/2006   ef 55-60%    There were no vitals filed for this visit.   Subjective Assessment - 11/10/21 1625     Subjective " I was sore last night but after stretchesit went away."    Pertinent History L TKA 04/30/15    Patient  Stated Goals "be able to get my L leg more stabilized for better comfort/confidence on stairs"    Currently in Pain? No/denies                               [facility] Adult PT Treatment/Exercise - 11/10/21 0001       Knee/Hip Exercises: Stretches   Active Hamstring Stretch Both;2 reps;30 seconds    Active Hamstring Stretch Limitations seated hip hinge    Hip Flexor Stretch Right;Left;2 reps;30 seconds    Hip Flexor Stretch Limitations seated lunge position over edge of chair    Other Knee/Hip Stretches Bil STKC stretch 2x20"      Knee/Hip Exercises: Aerobic   Recumbent Bike L2x37min      Knee/Hip Exercises: Standing   Functional Squat 10 reps    Functional Squat Limitations mini squat; cues to prevent knees over toes    SLS R/L 3x10"    Other Standing Knee Exercises B 4 way hip strengthening  with machine pulley 5lb, 10 reps each way   1HA     Knee/Hip Exercises: Supine   Bridges Strengthening;Both;2 sets;10 reps    Bridges Limitations GTB hip ABD  PT Short Term Goals - 11/09/21 1750       PT SHORT TERM GOAL #1   Title Patient will be independent with initial HEP    Status On-going    Target Date 11/15/21               PT Long Term Goals - 11/09/21 1750       PT LONG TERM GOAL #1   Title Patient will be independent with ongoing/advanced HEP for self-management at home    Status On-going    Target Date 12/13/21      PT LONG TERM GOAL #2   Title Patient to demonstrate improved tissue quality and pliability as noted by reduced tissue tightness and tenderness to palpation    Status On-going    Target Date 12/13/21      PT LONG TERM GOAL #3   Title Patient will demonstrate improved L LE strength to >/= 4+/5 for improved stability and ease of mobility    Status On-going    Target Date 12/13/21      PT LONG TERM GOAL #4   Title Patient will complete sit to stand transition with even weight bearing (no favoring  of L LE) and no increased L knee pain    Status On-going    Target Date 12/13/21      PT LONG TERM GOAL #5   Title Patient will negotiate stairs reciprocally with normal step pattern and minimal to no use of railing w/o limitation due to L knee pain or weakness    Status On-going    Target Date 12/13/21                   Plan - 11/10/21 1657     Clinical Impression Statement Pt did ok with the progression of exercises today, although showed some expected muscle fatigue due to the increased resistance. Cues needed to avoid trunk leaning with the hip exercises in standing. Cues needed to avoid knees going over toes with squats. We are able to progress w/o limitations, he is making progress.    Personal Factors and Comorbidities Age;Comorbidity 3+;Fitness;Past/Current Experience;Time since onset of injury/illness/exacerbation;Profession    Comorbidities L TKA 04/30/15, chronic a-fib, HTN, borderline DM, OSA, lumbar DDD, HLD, B tinnitus    PT Frequency 2x / week    PT Duration 6 weeks    PT Treatment/Interventions ADLs/Self Care Home Management;Cryotherapy;Electrical Stimulation;Iontophoresis 4mg /ml Dexamethasone;Moist Heat;Ultrasound;Gait training;Stair training;Functional mobility training;Therapeutic activities;Therapeutic exercise;Balance training;Neuromuscular re-education;Patient/family education;Manual techniques;Scar mobilization;Passive range of motion;Dry needling;Taping;Vasopneumatic Device    PT Next Visit Plan MT and stretch to address proximal LE tightness; hip/glute strengthening; modalities PRN    PT Home Exercise Plan Access Code: [email or web address] (1/30)    Consulted and Agree with Plan of Care Patient             Patient will benefit from skilled therapeutic intervention in order to improve the following deficits and impairments:  Abnormal gait, Decreased activity tolerance, Decreased balance, Decreased mobility, Decreased range of motion, Decreased strength, Increased  fascial restricitons, Increased muscle spasms, Impaired perceived functional ability, Impaired flexibility, Improper body mechanics, Postural dysfunction, Pain  Visit Diagnosis: Chronic pain of left knee  Stiffness of left knee, not elsewhere classified  Other symptoms and signs involving the musculoskeletal system  Muscle weakness (generalized)  Other abnormalities of gait and mobility  Difficulty in walking, not elsewhere classified     Problem List Patient Active Problem List   Diagnosis Date Noted   Tinnitus, bilateral 08/08/2018  Lumbar pain 02/23/2018   Degeneration of lumbar intervertebral disc 02/23/2018   Increased body mass index 02/23/2018   History of total knee arthroplasty 10/24/2017   Osteoarthritis 10/24/2017   Acute recurrent pansinusitis 09/13/2016   Chronic rhinitis 09/13/2016   Obstructive sleep apnea 09/13/2016   Primary osteoarthritis of left knee 04/30/2015   Left knee DJD 04/30/2015   Pneumonia 11/02/2014   Hypertension    Diabetes mellitus ([place])    Hyperlipidemia    OTHER AND UNSPECIFIED COAGULATION DEFECTS 09/28/2010   Chronic a-fib ([place]) 09/28/2010   PERSONAL HX COLONIC POLYPS 09/28/2010    Artist Pais, PTA 11/10/2021, 5:55 PM  [facility] [address]  [place], [place], [address] Phone: [phone]   Fax:  [phone]  Name: Dan [name] MRN: [id number] Date of Birth: 1951/06/02

## 2021-11-16 ENCOUNTER — Other Ambulatory Visit: Payer: Self-pay

## 2021-11-16 ENCOUNTER — Ambulatory Visit: Payer: PPO

## 2021-11-16 DIAGNOSIS — G8929 Other chronic pain: Secondary | ICD-10-CM

## 2021-11-16 DIAGNOSIS — R2689 Other abnormalities of gait and mobility: Secondary | ICD-10-CM

## 2021-11-16 DIAGNOSIS — R262 Difficulty in walking, not elsewhere classified: Secondary | ICD-10-CM

## 2021-11-16 DIAGNOSIS — M25562 Pain in left knee: Secondary | ICD-10-CM

## 2021-11-16 DIAGNOSIS — R29898 Other symptoms and signs involving the musculoskeletal system: Secondary | ICD-10-CM

## 2021-11-16 DIAGNOSIS — M6281 Muscle weakness (generalized): Secondary | ICD-10-CM

## 2021-11-16 DIAGNOSIS — M25662 Stiffness of left knee, not elsewhere classified: Secondary | ICD-10-CM

## 2021-11-16 NOTE — Patient Instructions (Signed)
Access Code: [email or web address] URL: [email or web address] Date: 11/16/2021 Prepared by: Clarene Essex  Exercises Seated Hamstring Stretch (Mirrored) - 2-3 x daily - 7 x weekly - 3 reps - 30 sec hold Seated Table Piriformis Stretch - 2-3 x daily - 7 x weekly - 3 reps - 30 sec hold Seated Hip Flexor Stretch (Mirrored) - 2-3 x daily - 7 x weekly - 3 reps - 30 sec hold Hip Flexor Stretch on Step - 2-3 x daily - 7 x weekly - 3 reps - 30 sec hold Standing ITB Stretch - 2-3 x daily - 7 x weekly - 3 reps - 30 sec hold Supine Bridge - 1 x daily - 7 x weekly - 2 sets - 10 reps Clamshell with Resistance - 1 x daily - 7 x weekly - 2 sets - 10 reps - 3 second hold Standing Hip Extension with Resistance at Ankles and Counter Support - 1 x daily - 7 x weekly - 2 sets - 10 reps Standing Hip Abduction with Resistance at Ankles and Counter Support - 1 x daily - 7 x weekly - 2 sets - 10 reps

## 2021-11-16 NOTE — Therapy (Signed)
[facility] [address]  [place], [place], [address] Phone: [phone]   Fax:  [phone]  Physical Therapy Treatment  Patient Details  Name: Dan [name] MRN: [id number] Date of Birth: 06/17/1951 Referring Provider (PT): Donnald Garre, MD   Encounter Date: 11/16/2021   PT End of Session - 11/16/21 1814     Visit Number 5    Number of Visits 12    Date for PT Re-Evaluation 12/13/21    Authorization Type [employer]    PT Start Time 1618    PT Stop Time 1658    PT Time Calculation (min) 40 min    Activity Tolerance Patient tolerated treatment well    Behavior During Therapy [facility] for tasks assessed/performed             Past Medical History:  Diagnosis Date   Arthritis    osteoarthritis left knee   BPH (benign prostatic hypertrophy)    Cholelithiasis    Chronic atrial fibrillation ([place])    Colon polyps    adenomatous   Diabetes mellitus    Diverticulosis    Dysrhythmia    tx. Pradaxa- Dr. [place] follows   Elevated LFTs    GERD (gastroesophageal reflux disease)    Hemorrhoids    Hyperlipidemia    Hypertension    Morbid obesity ([place])    Sleep apnea    CPAP nightly   Tubular adenoma of colon     Past Surgical History:  Procedure Laterality Date   KNEE ARTHROSCOPY Left 03/28/2014   Procedure: LEFT KNEE ARTHROSCOPY WITH DEBRIDEMENT  ;  Surgeon: Johnn Hai, MD;  Location: [place];  Service: Orthopedics;  Laterality: Left;   TONSILLECTOMY     TOTAL KNEE ARTHROPLASTY Left 04/30/2015   Procedure: LEFT TOTAL KNEE ARTHROPLASTY;  Surgeon: Susa Day, MD;  [facility];  Service: Orthopedics;  Laterality: Left;   US ECHOCARDIOGRAPHY  08/08/2006   ef 55-60%    There were no vitals filed for this visit.   Subjective Assessment - 11/16/21 1619     Subjective "Everything is going well, I had my massage therapist focus on my legs."    Currently in Pain? No/denies                                [facility] Adult PT Treatment/Exercise - 11/16/21 0001       Knee/Hip Exercises: Stretches   Active Hamstring Stretch Limitations reviewed    Hip Flexor Stretch Limitations reviewed      Knee/Hip Exercises: Aerobic   Recumbent Bike L3x41mn      Knee/Hip Exercises: Machines for Strengthening   Cybex Knee Extension 20lb 2x10 BLE    Cybex Knee Flexion 25lb 2x10 BLE      Knee/Hip Exercises: Standing   Hip Abduction Stengthening;Both;10 reps;Knee straight    Abduction Limitations GTB    Hip Extension Stengthening;Both;10 reps;Knee straight    Extension Limitations GTB    Functional Squat 10 reps    Functional Squat Limitations cues to prevent knees over toes; GTB isometric hip ABD      Knee/Hip Exercises: Supine   Bridges Strengthening;Both;2 sets;10 reps    Bridges Limitations GTB hip ABD      Knee/Hip Exercises: Sidelying   Clams 10x with GTB R/L  PT Education - 11/16/21 1816     Education Details HEP updated    Person(s) Educated Patient    Methods Explanation;Demonstration;Handout    Comprehension Verbalized understanding;Returned demonstration;Need further instruction              PT Short Term Goals - 11/16/21 1650       PT SHORT TERM GOAL #1   Title Patient will be independent with initial HEP    Status Achieved   11/16/21   Target Date 11/15/21               PT Long Term Goals - 11/09/21 1750       PT LONG TERM GOAL #1   Title Patient will be independent with ongoing/advanced HEP for self-management at home    Status On-going    Target Date 12/13/21      PT LONG TERM GOAL #2   Title Patient to demonstrate improved tissue quality and pliability as noted by reduced tissue tightness and tenderness to palpation    Status On-going    Target Date 12/13/21      PT LONG TERM GOAL #3   Title Patient will demonstrate improved L LE strength to >/= 4+/5 for improved stability  and ease of mobility    Status On-going    Target Date 12/13/21      PT LONG TERM GOAL #4   Title Patient will complete sit to stand transition with even weight bearing (no favoring of L LE) and no increased L knee pain    Status On-going    Target Date 12/13/21      PT LONG TERM GOAL #5   Title Patient will negotiate stairs reciprocally with normal step pattern and minimal to no use of railing w/o limitation due to L knee pain or weakness    Status On-going    Target Date 12/13/21                   Plan - 11/16/21 1816     Clinical Impression Statement Progressed HEP today with weight bearing TE, also progressed clams to sidelying. Pt does require cues to avoid compensating with most exercises. He requested to review his initial HEP today, he was independent with the exercises - STG met. As he reports his flexibility has improved, he shows some weakness in his hips continued.    Personal Factors and Comorbidities Age;Comorbidity 3+;Fitness;Past/Current Experience;Time since onset of injury/illness/exacerbation;Profession    Comorbidities L TKA 04/30/15, chronic a-fib, HTN, borderline DM, OSA, lumbar DDD, HLD, B tinnitus    PT Frequency 2x / week    PT Duration 6 weeks    PT Treatment/Interventions ADLs/Self Care Home Management;Cryotherapy;Electrical Stimulation;Iontophoresis 13m/ml Dexamethasone;Moist Heat;Ultrasound;Gait training;Stair training;Functional mobility training;Therapeutic activities;Therapeutic exercise;Balance training;Neuromuscular re-education;Patient/family education;Manual techniques;Scar mobilization;Passive range of motion;Dry needling;Taping;Vasopneumatic Device    PT Next Visit Plan MT and stretch to address proximal LE tightness; hip/glute strengthening; modalities PRN    PT Home Exercise Plan Access Code: [email or web address](1/30)    Consulted and Agree with Plan of Care Patient             Patient will benefit from skilled therapeutic intervention in order  to improve the following deficits and impairments:  Abnormal gait, Decreased activity tolerance, Decreased balance, Decreased mobility, Decreased range of motion, Decreased strength, Increased fascial restricitons, Increased muscle spasms, Impaired perceived functional ability, Impaired flexibility, Improper body mechanics, Postural dysfunction, Pain  Visit Diagnosis: Chronic pain of left knee  Stiffness of left knee, not  elsewhere classified  Other symptoms and signs involving the musculoskeletal system  Muscle weakness (generalized)  Other abnormalities of gait and mobility  Difficulty in walking, not elsewhere classified     Problem List Patient Active Problem List   Diagnosis Date Noted   Tinnitus, bilateral 08/08/2018   Lumbar pain 02/23/2018   Degeneration of lumbar intervertebral disc 02/23/2018   Increased body mass index 02/23/2018   History of total knee arthroplasty 10/24/2017   Osteoarthritis 10/24/2017   Acute recurrent pansinusitis 09/13/2016   Chronic rhinitis 09/13/2016   Obstructive sleep apnea 09/13/2016   Primary osteoarthritis of left knee 04/30/2015   Left knee DJD 04/30/2015   Pneumonia 11/02/2014   Hypertension    Diabetes mellitus ([place])    Hyperlipidemia    OTHER AND UNSPECIFIED COAGULATION DEFECTS 09/28/2010   Chronic a-fib ([place]) 09/28/2010   PERSONAL HX COLONIC POLYPS 09/28/2010    Artist Pais, PTA 11/16/2021, 6:25 PM  [facility] [address]  [place], [place], [address] Phone: [phone]   Fax:  [phone]  Name: Dan [name] MRN: [id number] Date of Birth: 19-Apr-1951

## 2021-11-17 ENCOUNTER — Ambulatory Visit: Payer: PPO

## 2021-11-17 DIAGNOSIS — M25662 Stiffness of left knee, not elsewhere classified: Secondary | ICD-10-CM

## 2021-11-17 DIAGNOSIS — M6281 Muscle weakness (generalized): Secondary | ICD-10-CM

## 2021-11-17 DIAGNOSIS — R262 Difficulty in walking, not elsewhere classified: Secondary | ICD-10-CM

## 2021-11-17 DIAGNOSIS — G8929 Other chronic pain: Secondary | ICD-10-CM

## 2021-11-17 DIAGNOSIS — M25562 Pain in left knee: Secondary | ICD-10-CM | POA: Diagnosis not present

## 2021-11-17 DIAGNOSIS — R29898 Other symptoms and signs involving the musculoskeletal system: Secondary | ICD-10-CM

## 2021-11-17 DIAGNOSIS — R2689 Other abnormalities of gait and mobility: Secondary | ICD-10-CM

## 2021-11-17 NOTE — Therapy (Signed)
[facility] [address]  [place], [place], [address] Phone: [phone]   Fax:  [phone]  Physical Therapy Treatment  Patient Details  Name: Dan [name] MRN: [id number] Date of Birth: 17-Nov-1950 Referring Provider (PT): Donnald Garre, MD   Encounter Date: 11/17/2021   PT End of Session - 11/17/21 1656     Visit Number 6    Number of Visits 12    Date for PT Re-Evaluation 12/13/21    Authorization Type [employer]    PT Start Time 1615    PT Stop Time 1658    PT Time Calculation (min) 43 min    Activity Tolerance Patient tolerated treatment well    Behavior During Therapy [facility] for tasks assessed/performed             Past Medical History:  Diagnosis Date   Arthritis    osteoarthritis left knee   BPH (benign prostatic hypertrophy)    Cholelithiasis    Chronic atrial fibrillation ([place])    Colon polyps    adenomatous   Diabetes mellitus    Diverticulosis    Dysrhythmia    tx. Pradaxa- Dr. Martinique follows   Elevated LFTs    GERD (gastroesophageal reflux disease)    Hemorrhoids    Hyperlipidemia    Hypertension    Morbid obesity ([place])    Sleep apnea    CPAP nightly   Tubular adenoma of colon     Past Surgical History:  Procedure Laterality Date   KNEE ARTHROSCOPY Left 03/28/2014   Procedure: LEFT KNEE ARTHROSCOPY WITH DEBRIDEMENT  ;  Surgeon: Johnn Hai, MD;  Location: [facility];  Service: Orthopedics;  Laterality: Left;   TONSILLECTOMY     TOTAL KNEE ARTHROPLASTY Left 04/30/2015   Procedure: LEFT TOTAL KNEE ARTHROPLASTY;  Surgeon: Susa Day, MD;  Location: [facility];  Service: Orthopedics;  Laterality: Left;   US ECHOCARDIOGRAPHY  08/08/2006   ef 55-60%    There were no vitals filed for this visit.   Subjective Assessment - 11/17/21 1614     Subjective Pt reports some soreness from last session.    Pertinent History L TKA 04/30/15    Patient Stated Goals "be  able to get my L leg more stabilized for better comfort/confidence on stairs"    Currently in Pain? No/denies                               [facility] Adult PT Treatment/Exercise - 11/17/21 0001       Knee/Hip Exercises: Stretches   Hip Flexor Stretch Right;Left;30 seconds    Hip Flexor Stretch Limitations standing runner stretch at wall    Other Knee/Hip Stretches trunk extension to improve hip extension 10x2", standing      Knee/Hip Exercises: Aerobic   Recumbent Bike L3x67min      Knee/Hip Exercises: Machines for Strengthening   Cybex Knee Extension 25lb 10 reps BLE, 10lb R/L 10 reps    Cybex Knee Flexion 25lb 10 reps BLE, R/L 15lb 10 reps      Knee/Hip Exercises: Standing   Heel Raises Both;10 reps    Hip Flexion AROM;Both;15 reps;Knee bent    Hip Flexion Limitations 1HA at counter    Lateral Step Up Both;10 reps;Hand Hold: 1;Step Height: 6"    Forward Step Up Both;10 reps;Hand Hold: 2;Step Height: 6"  PT Short Term Goals - 11/16/21 1650       PT SHORT TERM GOAL #1   Title Patient will be independent with initial HEP    Status Achieved   11/16/21   Target Date 11/15/21               PT Long Term Goals - 11/09/21 1750       PT LONG TERM GOAL #1   Title Patient will be independent with ongoing/advanced HEP for self-management at home    Status On-going    Target Date 12/13/21      PT LONG TERM GOAL #2   Title Patient to demonstrate improved tissue quality and pliability as noted by reduced tissue tightness and tenderness to palpation    Status On-going    Target Date 12/13/21      PT LONG TERM GOAL #3   Title Patient will demonstrate improved L LE strength to >/= 4+/5 for improved stability and ease of mobility    Status On-going    Target Date 12/13/21      PT LONG TERM GOAL #4   Title Patient will complete sit to stand transition with even weight bearing (no favoring of L LE) and no increased L knee pain     Status On-going    Target Date 12/13/21      PT LONG TERM GOAL #5   Title Patient will negotiate stairs reciprocally with normal step pattern and minimal to no use of railing w/o limitation due to L knee pain or weakness    Status On-going    Target Date 12/13/21                   Plan - 11/17/21 1659     Clinical Impression Statement Pt showed decreased stability and weakness on the R LE this session. He did report that the knee extensions were tiresome in the quads. Progressed standing ther ex w/o any complaints but more weakness noted on the R.    Personal Factors and Comorbidities Age;Comorbidity 3+;Fitness;Past/Current Experience;Time since onset of injury/illness/exacerbation;Profession    Comorbidities L TKA 04/30/15, chronic a-fib, HTN, borderline DM, OSA, lumbar DDD, HLD, B tinnitus    PT Frequency 2x / week    PT Duration 6 weeks    PT Treatment/Interventions ADLs/Self Care Home Management;Cryotherapy;Electrical Stimulation;Iontophoresis 4mg /ml Dexamethasone;Moist Heat;Ultrasound;Gait training;Stair training;Functional mobility training;Therapeutic activities;Therapeutic exercise;Balance training;Neuromuscular re-education;Patient/family education;Manual techniques;Scar mobilization;Passive range of motion;Dry needling;Taping;Vasopneumatic Device    PT Next Visit Plan hip/glute strengthening; modalities PRN    PT Home Exercise Plan Access Code: [email or web address] (1/30)    Consulted and Agree with Plan of Care Patient             Patient will benefit from skilled therapeutic intervention in order to improve the following deficits and impairments:  Abnormal gait, Decreased activity tolerance, Decreased balance, Decreased mobility, Decreased range of motion, Decreased strength, Increased fascial restricitons, Increased muscle spasms, Impaired perceived functional ability, Impaired flexibility, Improper body mechanics, Postural dysfunction, Pain  Visit Diagnosis: Chronic pain  of left knee  Stiffness of left knee, not elsewhere classified  Other symptoms and signs involving the musculoskeletal system  Muscle weakness (generalized)  Other abnormalities of gait and mobility  Difficulty in walking, not elsewhere classified     Problem List Patient Active Problem List   Diagnosis Date Noted   Tinnitus, bilateral 08/08/2018   Lumbar pain 02/23/2018   Degeneration of lumbar intervertebral disc 02/23/2018   Increased body mass index 02/23/2018  History of total knee arthroplasty 10/24/2017   Osteoarthritis 10/24/2017   Acute recurrent pansinusitis 09/13/2016   Chronic rhinitis 09/13/2016   Obstructive sleep apnea 09/13/2016   Primary osteoarthritis of left knee 04/30/2015   Left knee DJD 04/30/2015   Pneumonia 11/02/2014   Hypertension    Diabetes mellitus ([place])    Hyperlipidemia    OTHER AND UNSPECIFIED COAGULATION DEFECTS 09/28/2010   Chronic a-fib ([place]) 09/28/2010   PERSONAL HX COLONIC POLYPS 09/28/2010    Artist Pais, PTA 11/17/2021, 5:26 PM  [facility] [address]  [place], [place], [address] Phone: [phone]   Fax:  [phone]  Name: Dan [name] MRN: [id number] Date of Birth: Jun 17, 1951

## 2021-11-22 ENCOUNTER — Ambulatory Visit: Payer: PPO | Admitting: Physical Therapy

## 2021-11-22 ENCOUNTER — Encounter: Payer: Self-pay | Admitting: Physical Therapy

## 2021-11-22 ENCOUNTER — Other Ambulatory Visit: Payer: Self-pay

## 2021-11-22 DIAGNOSIS — R262 Difficulty in walking, not elsewhere classified: Secondary | ICD-10-CM

## 2021-11-22 DIAGNOSIS — M6281 Muscle weakness (generalized): Secondary | ICD-10-CM

## 2021-11-22 DIAGNOSIS — M25662 Stiffness of left knee, not elsewhere classified: Secondary | ICD-10-CM

## 2021-11-22 DIAGNOSIS — M25562 Pain in left knee: Secondary | ICD-10-CM | POA: Diagnosis not present

## 2021-11-22 DIAGNOSIS — G8929 Other chronic pain: Secondary | ICD-10-CM

## 2021-11-22 DIAGNOSIS — R2689 Other abnormalities of gait and mobility: Secondary | ICD-10-CM

## 2021-11-22 DIAGNOSIS — R29898 Other symptoms and signs involving the musculoskeletal system: Secondary | ICD-10-CM

## 2021-11-22 NOTE — Therapy (Signed)
[facility] [address]  [place], [place], [address] Phone: [phone]   Fax:  [phone]  Physical Therapy Treatment / Progress Note  Patient Details  Name: Dan [name] MRN: [id number] Date of Birth: 1951-05-12 Referring Provider (PT): Donnald Garre, MD  Progress Note  Reporting Period 11/01/2021 to 11/22/2021  See note below for Objective Data and Assessment of Progress/Goals.     Encounter Date: 11/22/2021   PT End of Session - 11/22/21 1620     Visit Number 7    Number of Visits 12    Date for PT Re-Evaluation 12/13/21    Authorization Type [employer]    PT Start Time 1620    PT Stop Time 1659    PT Time Calculation (min) 39 min    Activity Tolerance Patient tolerated treatment well    Behavior During Therapy WFL for tasks assessed/performed             Past Medical History:  Diagnosis Date   Arthritis    osteoarthritis left knee   BPH (benign prostatic hypertrophy)    Cholelithiasis    Chronic atrial fibrillation ([facility])    Colon polyps    adenomatous   Diabetes mellitus    Diverticulosis    Dysrhythmia    tx. Pradaxa- Dr. Martinique follows   Elevated LFTs    GERD (gastroesophageal reflux disease)    Hemorrhoids    Hyperlipidemia    Hypertension    Morbid obesity ([place])    Sleep apnea    CPAP nightly   Tubular adenoma of colon     Past Surgical History:  Procedure Laterality Date   KNEE ARTHROSCOPY Left 03/28/2014   Procedure: LEFT KNEE ARTHROSCOPY WITH DEBRIDEMENT  ;  Surgeon: Johnn Hai, MD;  Location: [facility];  Service: Orthopedics;  Laterality: Left;   TONSILLECTOMY     TOTAL KNEE ARTHROPLASTY Left 04/30/2015   Procedure: LEFT TOTAL KNEE ARTHROPLASTY;  Surgeon: Susa Day, MD;  [facility];  Service: Orthopedics;  Laterality: Left;   US ECHOCARDIOGRAPHY  08/08/2006   ef 55-60%    There were no vitals filed for this visit.   Subjective  Assessment - 11/22/21 1623     Subjective Pt reports pain in lateral L knee is more or less dissipated with the stretches and exercises but still having difficulty descending stairs - still has to use rail.    Pertinent History L TKA 04/30/15    Patient Stated Goals "be able to get my L leg more stabilized for better comfort/confidence on stairs"    Currently in Pain? No/denies                [facility] PT Assessment - 11/22/21 1620       Assessment   Medical Diagnosis L lateral knee pain    Referring Provider (PT) Donnald Garre, MD    Onset Date/Surgical Date 04/30/15   6.5 yrs ago s/p TKA   Next MD Visit 11/23/21      Strength   Right Hip Flexion 4+/5    Right Hip Extension 4/5    Right Hip External Rotation  4+/5    Right Hip Internal Rotation 5/5    Right Hip ABduction 5/5    Right Hip ADduction 4+/5    Left Hip Flexion 4+/5    Left Hip Extension 4/5    Left Hip External Rotation 4+/5    Left Hip Internal Rotation 5/5  Left Hip ABduction 4+/5    Left Hip ADduction 4/5    Right Knee Flexion 4+/5    Right Knee Extension 5/5   limited eccentric control   Left Knee Flexion 4+/5    Left Knee Extension 5/5    Right Ankle Dorsiflexion 4+/5    Left Ankle Dorsiflexion 5/5                           [facility] Adult PT Treatment/Exercise - 11/22/21 1620       Ambulation/Gait   Stairs Yes    Stairs Assistance 6: Modified independent (Device/Increase time)    Stair Management Technique One rail Right;Alternating pattern;Step to pattern;Forwards;Sideways    Number of Stairs 14    Height of Stairs 7    Gait Comments Good eccentric control with descent lowering with L LE but has to rotate pelvis to R and descend more laterally with R LE.      Exercises   Exercises Knee/Hip      Knee/Hip Exercises: Aerobic   Recumbent Bike L3 x 6 min      Knee/Hip Exercises: Machines for Strengthening   Cybex Leg Press 25# B LE x 15; 25# R & L single LE x 15 each       Knee/Hip Exercises: Standing   Step Down Right;Left;2 sets;10 reps;Step Height: 6";Hand Hold: 1    Step Down Limitations lateral eccentric lowering with light heel touch   cues to avoid compenstation at hip on R     Knee/Hip Exercises: Seated   Sit to Sand 5 reps;without UE support   good symmetrical lift with favoring of R LE                      PT Short Term Goals - 11/16/21 1650       PT SHORT TERM GOAL #1   Title Patient will be independent with initial HEP    Status Achieved   11/16/21   Target Date 11/15/21               PT Long Term Goals - 11/22/21 1626       PT LONG TERM GOAL #1   Title Patient will be independent with ongoing/advanced HEP for self-management at home    Status Partially Met   11/22/21 - met for current HEP   Target Date 12/13/21      PT LONG TERM GOAL #2   Title Patient to demonstrate improved tissue quality and pliability as noted by reduced tissue tightness and tenderness to palpation    Status Achieved   11/22/21     PT LONG TERM GOAL #3   Title Patient will demonstrate improved L LE strength to >/= 4+/5 for improved stability and ease of mobility    Status Partially Met    Target Date 12/13/21      PT LONG TERM GOAL #4   Title Patient will complete sit to stand transition with even weight bearing (no favoring of L LE) and no increased L knee pain    Status Achieved   11/22/21   Target Date --      PT LONG TERM GOAL #5   Title Patient will negotiate stairs reciprocally with normal step pattern and minimal to no use of railing w/o limitation due to L knee pain or weakness    Status Partially Met    Target Date 12/13/21  Plan - 11/22/21 1659     Clinical Impression Statement Rollen reports pain in lateral L knee has more or less dissipated but still having difficulty descending stairs with continued need to rely on railing. Assessment of stair negotiation reveals good L knee control on both ascent  and descent, but difficulty noted with R knee eccentric control causing pt to pivot to R and use sideways approach with hip compensation to complete lowering of L foot to next step. L LE flexibility and tissue tension has improved with pt no longer noting any restrictions - LTG #2 met. Overall B LE strength has improved but mild weakness still evident in L hip as compared to R, but more R eccentric quad weakness and R tib anterior weakness at knees and ankles. He is now able to complete sit to stand transition with even weight bearing and no favoring of L LE w/o increased L knee pain - LTG #4 met. Eddrick is progressing well with PT with STG and above indicated LTGs met, with remaining LTGs at least partially met. He will continue to benefit from skilled PT for further strengthening to allow for improved stair navigation and help him prepare for potential R TKA in the near future.    Comorbidities L TKA 04/30/15, chronic a-fib, HTN, borderline DM, OSA, lumbar DDD, HLD, B tinnitus    Rehab Potential Good    PT Frequency 2x / week    PT Duration 6 weeks    PT Treatment/Interventions ADLs/Self Care Home Management;Cryotherapy;Electrical Stimulation;Iontophoresis 27m/ml Dexamethasone;Moist Heat;Ultrasound;Gait training;Stair training;Functional mobility training;Therapeutic activities;Therapeutic exercise;Balance training;Neuromuscular re-education;Patient/family education;Manual techniques;Scar mobilization;Passive range of motion;Dry needling;Taping;Vasopneumatic Device    PT Next Visit Plan hip/glute strengthening and eccentric quad strengthening; modalities PRN    PT Home Exercise Plan Access Code: [email or web address](1/30, updated 2/7 & 2/14)    Consulted and Agree with Plan of Care Patient             Patient will benefit from skilled therapeutic intervention in order to improve the following deficits and impairments:  Abnormal gait, Decreased activity tolerance, Decreased balance, Decreased mobility,  Decreased range of motion, Decreased strength, Increased fascial restricitons, Increased muscle spasms, Impaired perceived functional ability, Impaired flexibility, Improper body mechanics, Postural dysfunction, Pain  Visit Diagnosis: Chronic pain of left knee  Stiffness of left knee, not elsewhere classified  Other symptoms and signs involving the musculoskeletal system  Muscle weakness (generalized)  Other abnormalities of gait and mobility  Difficulty in walking, not elsewhere classified     Problem List Patient Active Problem List   Diagnosis Date Noted   Tinnitus, bilateral 08/08/2018   Lumbar pain 02/23/2018   Degeneration of lumbar intervertebral disc 02/23/2018   Increased body mass index 02/23/2018   History of total knee arthroplasty 10/24/2017   Osteoarthritis 10/24/2017   Acute recurrent pansinusitis 09/13/2016   Chronic rhinitis 09/13/2016   Obstructive sleep apnea 09/13/2016   Primary osteoarthritis of left knee 04/30/2015   Left knee DJD 04/30/2015   Pneumonia 11/02/2014   Hypertension    Diabetes mellitus ([place]    Hyperlipidemia    OTHER AND UNSPECIFIED COAGULATION DEFECTS 09/28/2010   [place] 09/28/2010   PERSONAL HX COLONIC POLYPS 09/28/2010    JPercival Spanish PT 11/22/2021, 6:52 PM  [facility] [address] [place] [address]Phone: [phone]  Fax:  [phone] Name: GSALVATOR SEPPALAMRN: [id number] of Birth: [birth year]

## 2021-11-23 DIAGNOSIS — Z96652 Presence of left artificial knee joint: Secondary | ICD-10-CM | POA: Diagnosis not present

## 2021-11-23 DIAGNOSIS — M25562 Pain in left knee: Secondary | ICD-10-CM | POA: Diagnosis not present

## 2021-11-24 DIAGNOSIS — G4733 Obstructive sleep apnea (adult) (pediatric): Secondary | ICD-10-CM | POA: Diagnosis not present

## 2021-12-07 ENCOUNTER — Encounter: Payer: Self-pay | Admitting: Physical Therapy

## 2021-12-07 ENCOUNTER — Ambulatory Visit: Payer: PPO | Attending: Specialist | Admitting: Physical Therapy

## 2021-12-07 ENCOUNTER — Other Ambulatory Visit: Payer: Self-pay

## 2021-12-07 DIAGNOSIS — M6281 Muscle weakness (generalized): Secondary | ICD-10-CM

## 2021-12-07 DIAGNOSIS — M25662 Stiffness of left knee, not elsewhere classified: Secondary | ICD-10-CM | POA: Diagnosis not present

## 2021-12-07 DIAGNOSIS — M25562 Pain in left knee: Secondary | ICD-10-CM | POA: Diagnosis not present

## 2021-12-07 DIAGNOSIS — R29898 Other symptoms and signs involving the musculoskeletal system: Secondary | ICD-10-CM

## 2021-12-07 DIAGNOSIS — R2689 Other abnormalities of gait and mobility: Secondary | ICD-10-CM | POA: Diagnosis not present

## 2021-12-07 DIAGNOSIS — R262 Difficulty in walking, not elsewhere classified: Secondary | ICD-10-CM

## 2021-12-07 DIAGNOSIS — G8929 Other chronic pain: Secondary | ICD-10-CM | POA: Diagnosis not present

## 2021-12-07 NOTE — Patient Instructions (Signed)
? ? ? ? ?  Access Code: [email or web address] ?URL: [email or web address] ?Date: 12/07/2021 ?Prepared by: Annie Paras ? ?Exercises ?Seated Hamstring Stretch (Mirrored) - 2-3 x daily - 7 x weekly - 3 reps - 30 sec hold ?Seated Table Piriformis Stretch - 2-3 x daily - 7 x weekly - 3 reps - 30 sec hold ?Seated Hip Flexor Stretch (Mirrored) - 2-3 x daily - 7 x weekly - 3 reps - 30 sec hold ?Hip Flexor Stretch on Step - 2-3 x daily - 7 x weekly - 3 reps - 30 sec hold ?Standing ITB Stretch - 2-3 x daily - 7 x weekly - 3 reps - 30 sec hold ?Supine Bridge - 1 x daily - 7 x weekly - 2 sets - 10 reps ?Clamshell with Resistance - 1 x daily - 7 x weekly - 2 sets - 10 reps - 3 second hold ?Standing Hip Flexion with Anchored Resistance and Chair Support - 1 x daily - 4-5 x weekly - 2 sets - 10 reps - 3 sec hold ?Standing Hip Adduction with Anchored Resistance - 1 x daily - 4-5 x weekly - 2 sets - 10 reps - 3 sec hold ?Standing Hip Extension with Anchored Resistance - 1 x daily - 4-5 x weekly - 2 sets - 10 reps - 3 sec hold ?Standing Hip Abduction with Anchored Resistance - 1 x daily - 4-5 x weekly - 2 sets - 10 reps - 3 sec hold ?Lateral Step Down - 1 x daily - 4-5 x weekly - 2 sets - 10 reps - 3 sec hold ?Squat with Chair Touch - 1 x daily - 4-5 x weekly - 2 sets - 10 reps - 3 sec hold ? ?

## 2021-12-07 NOTE — Therapy (Addendum)
[facility] [address]  [place], [place], [address] Phone: [phone]   Fax:  [phone]  Physical Therapy Treatment / Recert  Patient Details  Name: Dan [name] MRN: [id number] Date of Birth: Sep 07, 1951 Referring Provider (PT): Donnald Garre, MD   Encounter Date: 12/07/2021   PT End of Session - 12/07/21 1535     Visit Number 8    Number of Visits 14    Date for PT Re-Evaluation 01/18/22    Authorization Type [employer]    Progress Note Due on Visit 14   Recert on visit #8 - 11/10/23   PT Start Time 1535    PT Stop Time 3664    PT Time Calculation (min) 42 min    Activity Tolerance Patient tolerated treatment well    Behavior During Therapy [facility] for tasks assessed/performed             Past Medical History:  Diagnosis Date   Arthritis    osteoarthritis left knee   BPH (benign prostatic hypertrophy)    Cholelithiasis    Chronic atrial fibrillation ([place])    Colon polyps    adenomatous   Diabetes mellitus    Diverticulosis    Dysrhythmia    tx. Pradaxa- Dr. Martinique follows   Elevated LFTs    GERD (gastroesophageal reflux disease)    Hemorrhoids    Hyperlipidemia    Hypertension    Morbid obesity ([place])    Sleep apnea    CPAP nightly   Tubular adenoma of colon     Past Surgical History:  Procedure Laterality Date   KNEE ARTHROSCOPY Left 03/28/2014   Procedure: LEFT KNEE ARTHROSCOPY WITH DEBRIDEMENT  ;  Surgeon: Johnn Hai, MD;  Location: [facility];  Service: Orthopedics;  Laterality: Left;   TONSILLECTOMY     TOTAL KNEE ARTHROPLASTY Left 04/30/2015   Procedure: LEFT TOTAL KNEE ARTHROPLASTY;  Surgeon: Susa Day, MD;  Location: [facility];  Service: Orthopedics;  Laterality: Left;   US ECHOCARDIOGRAPHY  08/08/2006   ef 55-60%    There were no vitals filed for this visit.   Subjective Assessment - 12/07/21 1539     Subjective Pt reports he saw Dr. Tonita Cong on  11/23/21. He states MD felt like his L knee was doing well but stated he still needs to work on his quad strength.    Pertinent History L TKA 04/30/15    Patient Stated Goals "be able to get my L leg more stabilized for better comfort/confidence on stairs"    Currently in Pain? 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    Leisure walking 1 mile a couple times/week; golf      Observation/Other Assessments   Focus on Therapeutic Outcomes (FOTO)  Knee = 70      Strength   Right Hip Flexion 4+/5    Right Hip Extension 4/5   limited ROM   Right Hip  External Rotation  4+/5    Right Hip Internal Rotation 5/5    Right Hip ABduction 5/5    Right Hip ADduction 4/5    Left Hip Flexion 4+/5    Left Hip Extension 4/5    Left Hip External Rotation 4+/5    Left Hip Internal Rotation 5/5    Left Hip ABduction 4/5    Left Hip ADduction 4/5    Right Knee Flexion 4+/5    Right Knee Extension 5/5   limited eccentric control   Left Knee Flexion 4+/5    Left Knee Extension 5/5    Right Ankle Dorsiflexion 4+/5    Left Ankle Dorsiflexion 5/5                           [facility] Adult PT Treatment/Exercise - 12/07/21 1535       Knee/Hip Exercises: Aerobic   Recumbent Bike L3 x 6 min      Knee/Hip Exercises: Standing   Hip Flexion Right;Left;10 reps;Stengthening;Knee straight    Hip Flexion Limitations green TB at ankle; UE support on back of chair for balance    Hip ADduction Right;Left;10 reps;Strengthening    Hip ADduction Limitations green TB at ankle; UE support on back of chair for balance    Hip Abduction Right;Left;10  reps;Stengthening;Knee straight    Abduction Limitations green TB at ankle; UE support on back of chair for balance    Hip Extension Right;Left;10 reps;Stengthening;Knee straight    Extension Limitations green TB at ankle; UE support on back of chair for balance    Step Down Right;Left;2 sets;10 reps;Hand Hold: 1;Step Height: 8"    Step Down Limitations lateral eccentric lowering to tolerance w/o dropping hip                     PT Education - 12/07/21 2013     Education Details HEP update; reivew of current goal status and plan for recert to extend POC    Person(s) Educated Patient    Methods Explanation;Demonstration;Handout    Comprehension Verbalized understanding;Returned demonstration;Need further instruction              PT Short Term Goals - 11/16/21 1650       PT SHORT TERM GOAL #1   Title Patient will be independent with initial HEP    Status Achieved   11/16/21   Target Date 11/15/21               PT Long Term Goals - 12/07/21 1541       PT LONG TERM GOAL #1   Title Patient will be independent with ongoing/advanced HEP for self-management at home    Status Partially Met   12/07/21 - met for current HEP; updated today   Target Date 01/18/22      PT LONG TERM GOAL #2   Title Patient to demonstrate improved tissue quality and pliability as noted by reduced tissue tightness and tenderness to palpation    Status Achieved   11/22/21     PT LONG TERM GOAL #3   Title Patient will demonstrate improved L LE strength to >/= 4+/5 for improved stability and ease of mobility    Status Partially Met    Target Date 01/18/22      PT LONG TERM GOAL #4   Title Patient will complete sit to stand transition with even weight bearing (no favoring of L LE) and no increased L  knee pain    Status Achieved   11/22/21     PT LONG TERM GOAL #5   Title Patient will negotiate stairs reciprocally with normal step pattern and minimal to no use of railing w/o limitation due  to L knee pain or weakness    Status Partially Met    Target Date 01/18/22                   Plan - 12/07/21 1542     Clinical Impression Statement Aspen reports pain in lateral L knee remains well controlled but states MD wants him to continue to work on his quad and LE strength. Functionally he feels most limited with descending stairs with continued need to rely on railing - recent assessment of stair negotiation reveals good L knee control on both ascent and descent, but difficulty noted with R knee eccentric control causing pt to pivot to R and use sideways approach with hip compensation to complete lowering of L foot to next step. B LE strength has improved but mild weakness still evident in L hip as compared to R, along with R eccentric quad weakness as noted with stair negotiation and R tib anterior weakness. Exercises progressed to address ongoing hip and eccentric quad weakness and HEP updated to reflect exercise progression. Tameron is progressing well with PT with STG and some LTGs met, with remaining LTGs at least partially met. He will continue to benefit from skilled PT for further strengthening to allow for improved stair navigation and help him prepare for potential R TKA in the near future, therefore will proceed with recert for up to 6 additional PT visits at 2x/wk frequency.    Personal Factors and Comorbidities Age;Comorbidity 3+;Fitness;Past/Current Experience;Time since onset of injury/illness/exacerbation;Profession    Comorbidities L TKA 04/30/15, chronic a-fib, HTN, borderline DM, OSA, lumbar DDD, HLD, B tinnitus    Examination-Activity Limitations Locomotion Level;Stand;Squat;Stairs    Examination-Participation Restrictions Cleaning;Community Activity;Occupation;Shop;Yard Work    Rehab Potential Good    PT Frequency 2x / week    PT Duration 6 weeks   4-6 weeks   PT Treatment/Interventions ADLs/Self Care Home Management;Cryotherapy;Electrical  Stimulation;Iontophoresis 63m/ml Dexamethasone;Moist Heat;Ultrasound;Gait training;Stair training;Functional mobility training;Therapeutic activities;Therapeutic exercise;Balance training;Neuromuscular re-education;Patient/family education;Manual techniques;Scar mobilization;Passive range of motion;Dry needling;Taping;Vasopneumatic Device    PT Next Visit Plan hip/glute strengthening and eccentric quad strengthening, tib anterior strengthening; modalities PRN    PT Home Exercise Plan Access Code: [email or web address](1/30, updated 2/7, 2/14 & 3/7)    Consulted and Agree with Plan of Care Patient             Patient will benefit from skilled therapeutic intervention in order to improve the following deficits and impairments:  Abnormal gait, Decreased activity tolerance, Decreased balance, Decreased mobility, Decreased range of motion, Decreased strength, Increased fascial restricitons, Increased muscle spasms, Impaired perceived functional ability, Impaired flexibility, Improper body mechanics, Postural dysfunction, Pain  Visit Diagnosis: Muscle weakness (generalized) - Plan: PT plan of care cert/re-cert  Other abnormalities of gait and mobility - Plan: PT plan of care cert/re-cert  Difficulty in walking, not elsewhere classified - Plan: PT plan of care cert/re-cert  Chronic pain of left knee - Plan: PT plan of care cert/re-cert  Stiffness of left knee, not elsewhere classified - Plan: PT plan of care cert/re-cert  Other symptoms and signs involving the musculoskeletal system - Plan: PT plan of care cert/re-cert     Problem List Patient Active Problem List   Diagnosis Date Noted   Tinnitus, bilateral  08/08/2018   Lumbar pain 02/23/2018   Degeneration of lumbar intervertebral disc 02/23/2018   Increased body mass index 02/23/2018   History of total knee arthroplasty 10/24/2017   Osteoarthritis 10/24/2017   Acute recurrent pansinusitis 09/13/2016   Chronic rhinitis 09/13/2016   Obstructive  sleep apnea 09/13/2016   Primary osteoarthritis of left knee 04/30/2015   Left knee DJD 04/30/2015   Pneumonia 11/02/2014   Hypertension    Diabetes mellitus ([place])    Hyperlipidemia    OTHER AND UNSPECIFIED COAGULATION DEFECTS 09/28/2010   Chronic a-fib ([place]) 09/28/2010   PERSONAL HX COLONIC POLYPS 09/28/2010    Percival Spanish, PT 12/07/2021, 8:28 PM  [facility] [address]  [place] [place], [place], [address] Phone: [phone]   Fax:  [phone]  Name: MARCELLO TUZZOLINO MRN: [id number] Date of Birth: 1951-03-30

## 2021-12-09 NOTE — Progress Notes (Unsigned)
[name] Date of Birth: 1951-05-07   History of Present Illness Dan [name] is seen today for follow up of atrial fibrillation. He has a history of chronic atrial fibrillation that has been managed with rate control and anticoagulation. Stress Myoview in August 2014 which was low risk with a small fixed apical defect. No ischemia. He also has a history of HTN, DM, obesity, and HLD. 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 There was incidental findings of gallstones. The liver was nodular in appearance c/w cirrhosis. Subsequent CT showed evidence of cirrhosis. He did have normal EGD in February 2019without varices and colonoscopy showed some polyps and tics.   On follow up today he is really focusing on weight loss. Trying to increase his activity but has been limited by knee pain. He is working with PT now and trying to increase his walking distance. Now on Ozempic for DM. Is still unaware of his Afib. No chest pain, dyspnea, palpitations. Edema has improved with his exercise routine.   Current Outpatient Medications on File Prior to Visit  Medication Sig Dispense Refill   amLODipine (NORVASC) 10 MG tablet Take 10 mg by mouth every morning.     apixaban (ELIQUIS) 5 MG TABS tablet TAKE 1 TABLET BY MOUTH TWICE DAILY. 180 tablet 3   BYSTOLIC 10 MG tablet Take 40 mg by mouth every morning.   2   cephALEXin (KEFLEX) 500 MG capsule cephalexin 500 mg capsule  TAKE 2 CAPSULES BY MOUTH 2 HOURS BEFORE DENTAL PROCEDURE THEN TAKE 1 CAPSULE BY MOUTH EVERY DAY 6 HOURS AFTER PROCEDURE     ezetimibe-simvastatin (VYTORIN) 10-20 MG per tablet Take 1 tablet by mouth every morning.     fluticasone (FLONASE) 50 MCG/ACT nasal spray Place into the nose.     furosemide (LASIX) 20 MG tablet Take 20 mg by mouth daily.     hydrALAZINE (APRESOLINE) 50 MG tablet TAKE 1 TABLET(50 MG) BY MOUTH TWICE DAILY 180 tablet 2    lisinopril (ZESTRIL) 40 MG tablet Take 40 mg by mouth daily.     losartan (COZAAR) 100 MG tablet TAKE 1 TABLET(100 MG) BY MOUTH DAILY 90 tablet 1   metFORMIN (GLUCOPHAGE) 1000 MG tablet Take 1 tablet by mouth 2 (two) times daily with a meal.      omeprazole (PRILOSEC) 40 MG capsule TAKE 1 CAPSULE(40 MG) BY MOUTH DAILY 30 capsule 3   OZEMPIC, 1 MG/DOSE, 4 MG/3ML SOPN Taking for 90 days only to see if works     sildenafil (VIAGRA) 100 MG tablet      spironolactone (ALDACTONE) 25 MG tablet TAKE 1 TABLET(25 MG) BY MOUTH DAILY 90 tablet 3   No current facility-administered medications on file prior to visit.    Allergies  Allergen Reactions   Macadamia Nut Oil Shortness Of Breath    Past Medical History:  Diagnosis Date   Arthritis    osteoarthritis left knee   BPH (benign prostatic hypertrophy)    Cholelithiasis    Chronic atrial fibrillation ([facility])    Colon polyps    adenomatous   Diabetes mellitus    Diverticulosis    Dysrhythmia    tx. Pradaxa- Dr. Martinique follows   Elevated LFTs    GERD (gastroesophageal reflux disease)    Hemorrhoids    Hyperlipidemia    Hypertension    Morbid obesity ([place])    Sleep apnea  CPAP nightly   Tubular adenoma of colon     Past Surgical History:  Procedure Laterality Date   KNEE ARTHROSCOPY Left 03/28/2014   Procedure: LEFT KNEE ARTHROSCOPY WITH DEBRIDEMENT  ;  Surgeon: Johnn Hai, MD;  Location: [facility];  Service: Orthopedics;  Laterality: Left;   TONSILLECTOMY     TOTAL KNEE ARTHROPLASTY Left 04/30/2015   Procedure: LEFT TOTAL KNEE ARTHROPLASTY;  Surgeon: Susa Day, MD;  Location[facility];  Service: Orthopedics;  Laterality: Left;   US ECHOCARDIOGRAPHY  08/08/2006   ef 55-60%    Social History   Tobacco Use  Smoking Status Former   Types: Cigarettes   Quit date: 06/20/1991   Years since quitting: 30.4  Smokeless Tobacco Former    Social History   Substance and Sexual Activity  Alcohol Use Yes    Alcohol/week: 0.0 standard drinks   Comment: social    Family History  Problem Relation Age of Onset   Heart attack Father    Colon cancer Mother    Breast cancer Sister     Review of Systems: As per history of present illness.  All other systems were reviewed and are negative.  Physical Exam: There were no vitals taken for this visit.  GENERAL:  Well appearing, obese WM in NAD HEENT:  PERRL, EOMI, sclera are clear. Oropharynx is clear. NECK:  No jugular venous distention, carotid upstroke brisk and symmetric, no bruits, no thyromegaly or adenopathy LUNGS:  Clear to auscultation bilaterally CHEST:  Unremarkable HEART:  IRRR,  PMI not displaced or sustained,S1 and S2 within normal limits, no S3, no S4: no clicks, no rubs, no murmurs ABD:  Soft, nontender. BS +, no masses or bruits. No hepatomegaly, no splenomegaly EXT:  2 + pulses throughout, no edema, skin around ankles is scaly, no cyanosis no clubbing SKIN:  Warm and dry.  Hyperpigmentation LE. Excellent pulses. NEURO:  Alert and oriented x 3. Cranial nerves II through XII intact. PSYCH:  Cognitively intact    LABORATORY DATA: Lab Results  Component Value Date   WBC 8.0 12/02/2016   HGB 15.0 12/02/2016   HCT 45.5 12/02/2016   PLT 224.0 12/02/2016   GLUCOSE 187 (H) 12/13/2016   ALT 25 12/02/2016   AST 25 12/02/2016   NA 140 12/13/2016   K 4.2 12/13/2016   CL 99 12/13/2016   CREATININE 0.83 12/13/2016   BUN 15 12/13/2016   CO2 30 12/13/2016   INR 1.3 (H) 12/02/2016   Labs dated 06/10/16: cholesterol 112, triglycerides 122, HDL 37, LDL 51.  Dated 2.5.18: A1c 7.4%.  Dated 06/15/17: cholesterol 104, triglycerides 127, HDL 38, LDL 41, CBC, CMET, TSH normal Dated 03/15/18: A1c 5.9%.  Dated 07/27/18: cholesterol 99, triglycerides 68, HDL 46, LDL 39. CMET, CBC, TSH normal Dated 04/01/19 A1c 6.8%.  Dated 07/31/19: cholesterol 126, triglycerides 169, HDL 43, LDL 49. A1c 7.3%. CBC, CMET and TSH normal Dated 08/06/20: A1c 6.6%.  cholesterol 114, triglycerides 150, HDL 44, LDL 40. CMET and TSH normal Dated 08/25/21: cholesterol 108, triglycerides 109, HDL 44, LDL 42. A1c 7.1%. CMET normal  ECG today demonstrates atrial fibrillation with a controlled ventricular response of 103 beats per minute. It is otherwise normal. I have personally reviewed and interpreted this study.   Assessment / Plan:d 1. Permanent atrial fibrillation. Rate is well controlled and he is anticoagulated with Eliquis He is asymptomatic. We will continue with his current therapy.  2. Low risk Myoview in 2014 without ischemia.  3. Hypertension-previously  resistant. Now well controlled on multiple meds.   4. Diabetes mellitus type 2-A1c 6.6%. now on Ozempic. Focusing on weight loss.   5. Hyperlipidemia on Vytorin. Excellent control  6. Edema. Resolved.   7. Hepatic cirrhosis. Probably related to fatty liver.   Follow up in one year

## 2021-12-14 ENCOUNTER — Encounter: Payer: Self-pay | Admitting: Cardiology

## 2021-12-14 ENCOUNTER — Ambulatory Visit (INDEPENDENT_AMBULATORY_CARE_PROVIDER_SITE_OTHER): Payer: PPO | Admitting: Cardiology

## 2021-12-14 ENCOUNTER — Other Ambulatory Visit: Payer: Self-pay

## 2021-12-14 VITALS — BP 140/90 | HR 112 | Ht 73.0 in | Wt 282.8 lb

## 2021-12-14 DIAGNOSIS — I6529 Occlusion and stenosis of unspecified carotid artery: Secondary | ICD-10-CM

## 2021-12-14 DIAGNOSIS — I10 Essential (primary) hypertension: Secondary | ICD-10-CM

## 2021-12-14 DIAGNOSIS — E785 Hyperlipidemia, unspecified: Secondary | ICD-10-CM

## 2021-12-14 DIAGNOSIS — I482 Chronic atrial fibrillation, unspecified: Secondary | ICD-10-CM

## 2021-12-14 MED ORDER — LOSARTAN POTASSIUM 100 MG PO TABS: ORAL_TABLET | ORAL | 3 refills | Status: DC

## 2021-12-14 NOTE — Addendum Note (Signed)
Addended by: Kathyrn Lass on: 12/14/2021 09:07 AM ? ? Modules accepted: Orders ? ?

## 2021-12-14 NOTE — Patient Instructions (Signed)
Medication Instructions:  ?Continue same medications ?*If you need a refill on your cardiac medications before your next appointment, please call your pharmacy* ? ? ?Lab Work: ?None ordered ? ? ?Testing/Procedures: ?Carotid dopplers ? ? ?Follow-Up: ?At [facility], you and your health needs are our priority.  As part of our continuing mission to provide you with exceptional heart care, we have created designated Provider Care Teams.  These Care Teams include your primary Cardiologist (physician) and Advanced Practice Providers (APPs -  Physician Assistants and Nurse Practitioners) who all work together to provide you with the care you need, when you need it. ? ?We recommend signing up for the patient portal called "MyChart".  Sign up information is provided on this After Visit Summary.  MyChart is used to connect with patients for Virtual Visits (Telemedicine).  Patients are able to view lab/test results, encounter notes, upcoming appointments, etc.  Non-urgent messages can be sent to your provider as well.   ?To learn more about what you can do with MyChart, go to [email or web address].   ? ?Your next appointment:  1 year    Call in Dec to schedule March appointment ?  ? ?The format for your next appointment: Office  ? ? ?Provider:  Dr.Jordan ? ? ?

## 2021-12-16 ENCOUNTER — Ambulatory Visit: Payer: PPO

## 2021-12-16 ENCOUNTER — Other Ambulatory Visit: Payer: Self-pay

## 2021-12-16 DIAGNOSIS — G8929 Other chronic pain: Secondary | ICD-10-CM

## 2021-12-16 DIAGNOSIS — R29898 Other symptoms and signs involving the musculoskeletal system: Secondary | ICD-10-CM

## 2021-12-16 DIAGNOSIS — R2689 Other abnormalities of gait and mobility: Secondary | ICD-10-CM

## 2021-12-16 DIAGNOSIS — R262 Difficulty in walking, not elsewhere classified: Secondary | ICD-10-CM

## 2021-12-16 DIAGNOSIS — M25662 Stiffness of left knee, not elsewhere classified: Secondary | ICD-10-CM

## 2021-12-16 DIAGNOSIS — M6281 Muscle weakness (generalized): Secondary | ICD-10-CM | POA: Diagnosis not present

## 2021-12-16 NOTE — Therapy (Signed)
[place] ?[facility] ?[place] ?[place], [place], [address] ?Phone: [phone]   Fax:  [phone] ? ?Physical Therapy Treatment ? ?Patient Details  ?Name: Dan [name] ?MRN: [id number] ?Date of Birth: [birth year] ?Referring Provider (PT): Donnald Garre, MD ? ? ?Encounter Date: 12/16/2021 ? ? PT End of Session - 12/16/21 1602   ? ? Visit Number 9   ? Number of Visits 14   ? Date for PT Re-Evaluation 01/18/22   ? Authorization Type [employer]   ? Progress Note Due on Visit 14   ? PT Start Time 1532   ? PT Stop Time 1600   pt requested short session  ? PT Time Calculation (min) 28 min   ? Activity Tolerance Patient tolerated treatment well   ? Behavior During Therapy [facility] for tasks assessed/performed   ? ?  ?  ? ?  ? ? ?Past Medical History:  ?Diagnosis Date  ? Arthritis   ? osteoarthritis left knee  ? BPH (benign prostatic hypertrophy)   ? Cholelithiasis   ? Chronic atrial fibrillation (HCC)   ? Colon polyps   ? adenomatous  ? Diabetes mellitus   ? Diverticulosis   ? Dysrhythmia   ? tx. Pradaxa- Dr. Martinique follows  ? Elevated LFTs   ? GERD (gastroesophageal reflux disease)   ? Hemorrhoids   ? Hyperlipidemia   ? Hypertension   ? Morbid obesity ([place])   ? Sleep apnea   ? CPAP nightly  ? Tubular adenoma of colon   ? ? ?Past Surgical History:  ?Procedure Laterality Date  ? KNEE ARTHROSCOPY Left 03/28/2014  ? Procedure: LEFT KNEE ARTHROSCOPY WITH DEBRIDEMENT  ;  Surgeon: Johnn Hai, MD;  Location: [facility];  Service: Orthopedics;  Laterality: Left;  ? TONSILLECTOMY    ? TOTAL KNEE ARTHROPLASTY Left 04/30/2015  ? Procedure: LEFT TOTAL KNEE ARTHROPLASTY;  Surgeon: Susa Day, MD;  Location: [facility];  Service: Orthopedics;  Laterality: Left;  ? US ECHOCARDIOGRAPHY  08/08/2006  ? ef 55-60%  ? ? ?There were no vitals filed for this visit. ? ? Subjective Assessment - 12/16/21 1535   ? ? Subjective Pt doing well today.   ? Pertinent History L  TKA 04/30/15   ? Patient Stated Goals "be able to get my L leg more stabilized for better comfort/confidence on stairs"   ? Currently in Pain? No/denies   ? ?  ?  ? ?  ? ? ? ? ? ? ? ? ? ? ? ? ? ? ? ? ? ? ? ? [place] Adult PT Treatment/Exercise - 12/16/21 0001   ? ?  ? Knee/Hip Exercises: Aerobic  ? Recumbent Bike L3 x 6 min   ?  ? Knee/Hip Exercises: Machines for Strengthening  ? Cybex Knee Extension 15lb B con/R ecc 6 reps   ?  ? Knee/Hip Exercises: Standing  ? Hip Flexion Right;Left;10 reps;Stengthening;Knee straight   ? Hip Flexion Limitations green TB at ankle; UE support on back of chair for balance   ? Hip ADduction Right;Left;10 reps;Strengthening   ? Hip ADduction Limitations green TB at ankle; UE support on back of chair for balance   ? Hip Abduction Right;Left;10 reps;Stengthening;Knee straight   ? Abduction Limitations green TB at ankle; UE support on back of chair for balance   ? Hip Extension Right;Left;10 reps;Stengthening;Knee straight   ? Extension Limitations green TB at ankle; UE support on back of chair for  balance   ? Lateral Step Up Both;10 reps;Hand Hold: 1;Step Height: 6"   ? Step Down Right;Left;10 reps;Hand Hold: 1;Step Height: 8"   ? Step Down Limitations lateral eccentric lowering to tolerance w/o dropping hip   ? Functional Squat 2 sets;10 reps   ? Functional Squat Limitations squat touch to chair   ? Other Standing Knee Exercises resisted 4 way hip   ? ?  ?  ? ?  ? ? ? ? ? ? ? ? ? ? ? ? PT Short Term Goals - 11/16/21 1650   ? ?  ? PT SHORT TERM GOAL #1  ? Title Patient will be independent with initial HEP   ? Status Achieved   11/16/21  ? Target Date 11/15/21   ? ?  ?  ? ?  ? ? ? ? PT Long Term Goals - 12/07/21 1541   ? ?  ? PT LONG TERM GOAL #1  ? Title Patient will be independent with ongoing/advanced HEP for self-management at home   ? Status Partially Met   12/07/21 - met for current HEP; updated today  ? Target Date 01/18/22   ?  ? PT LONG TERM GOAL #2  ? Title Patient to demonstrate  improved tissue quality and pliability as noted by reduced tissue tightness and tenderness to palpation   ? Status Achieved   11/22/21  ?  ? PT LONG TERM GOAL #3  ? Title Patient will demonstrate improved L LE strength to >/= 4+/5 for improved stability and ease of mobility   ? Status Partially Met   ? Target Date 01/18/22   ?  ? PT LONG TERM GOAL #4  ? Title Patient will complete sit to stand transition with even weight bearing (no favoring of L LE) and no increased L knee pain   ? Status Achieved   11/22/21  ?  ? PT LONG TERM GOAL #5  ? Title Patient will negotiate stairs reciprocally with normal step pattern and minimal to no use of railing w/o limitation due to L knee pain or weakness   ? Status Partially Met   ? Target Date 01/18/22   ? ?  ?  ? ?  ? ? ? ? ? ? ? ? Plan - 12/16/21 1603   ? ? Clinical Impression Statement Pt requested the session to be shortened. Reviewed new exercises added to HEP. Postural cues needed with 4 way hip and STS on stance. Pt did have tendency to show less quad control with ecc step down and hip drop. With 4 way hip he showed more difficulty with hip extension. Pt had a good response to the treatment today, would continue to benefit from PT to improve WB eccentric quad strength.   ? Personal Factors and Comorbidities Age;Comorbidity 3+;Fitness;Past/Current Experience;Time since onset of injury/illness/exacerbation;Profession   ? Comorbidities L TKA 04/30/15, chronic a-fib, HTN, borderline DM, OSA, lumbar DDD, HLD, B tinnitus   ? PT Frequency 2x / week   ? PT Duration 6 weeks   4-6 weeks  ? PT Treatment/Interventions ADLs/Self Care Home Management;Cryotherapy;Electrical Stimulation;Iontophoresis 4m/ml Dexamethasone;Moist Heat;Ultrasound;Gait training;Stair training;Functional mobility training;Therapeutic activities;Therapeutic exercise;Balance training;Neuromuscular re-education;Patient/family education;Manual techniques;Scar mobilization;Passive range of motion;Dry  needling;Taping;Vasopneumatic Device   ? PT Next Visit Plan hip/glute strengthening and eccentric quad strengthening, tib anterior strengthening; modalities PRN   ? PT Home Exercise Plan Access Code: [email or web address](1/30, updated 2/7, 2/14 & 3/7)   ? Consulted and Agree with Plan of Care Patient   ? ?  ?  ? ?  ? ? ?  Patient will benefit from skilled therapeutic intervention in order to improve the following deficits and impairments:  Abnormal gait, Decreased activity tolerance, Decreased balance, Decreased mobility, Decreased range of motion, Decreased strength, Increased fascial restricitons, Increased muscle spasms, Impaired perceived functional ability, Impaired flexibility, Improper body mechanics, Postural dysfunction, Pain ? ?Visit Diagnosis: ?Muscle weakness (generalized) ? ?Other abnormalities of gait and mobility ? ?Difficulty in walking, not elsewhere classified ? ?Chronic pain of left knee ? ?Stiffness of left knee, not elsewhere classified ? ?Other symptoms and signs involving the musculoskeletal system ? ? ? ? ?Problem List ?Patient Active Problem List  ? Diagnosis Date Noted  ? Tinnitus, bilateral 08/08/2018  ? Lumbar pain 02/23/2018  ? Degeneration of lumbar intervertebral disc 02/23/2018  ? Increased body mass index 02/23/2018  ? History of total knee arthroplasty 10/24/2017  ? Osteoarthritis 10/24/2017  ? Acute recurrent pansinusitis 09/13/2016  ? Chronic rhinitis 09/13/2016  ? Obstructive sleep apnea 09/13/2016  ? Primary osteoarthritis of left knee 04/30/2015  ? Left knee DJD 04/30/2015  ? Pneumonia 11/02/2014  ? Hypertension   ? Diabetes mellitus ([place])   ? Hyperlipidemia   ? OTHER AND UNSPECIFIED COAGULATION DEFECTS 09/28/2010  ? Chronic a-fib ([place]) 09/28/2010  ? PERSONAL HX COLONIC POLYPS 09/28/2010  ? ? ?Artist Pais, PTA ?12/16/2021, 4:16 PM ? ?[place] ?[facility] ?[place] ?[place], [place], [address] ?Phone: [phone]   Fax:   [phone] ? ?Name: Dan [name] ?MRN: [id number] ?Date of Birth: May 25, 1951 ? ? ? ?

## 2021-12-18 DIAGNOSIS — G8929 Other chronic pain: Secondary | ICD-10-CM | POA: Diagnosis not present

## 2021-12-18 DIAGNOSIS — I4891 Unspecified atrial fibrillation: Secondary | ICD-10-CM | POA: Diagnosis not present

## 2021-12-18 DIAGNOSIS — E1121 Type 2 diabetes mellitus with diabetic nephropathy: Secondary | ICD-10-CM | POA: Diagnosis not present

## 2021-12-18 DIAGNOSIS — G4733 Obstructive sleep apnea (adult) (pediatric): Secondary | ICD-10-CM | POA: Diagnosis not present

## 2021-12-18 DIAGNOSIS — E785 Hyperlipidemia, unspecified: Secondary | ICD-10-CM | POA: Diagnosis not present

## 2021-12-18 DIAGNOSIS — I10 Essential (primary) hypertension: Secondary | ICD-10-CM | POA: Diagnosis not present

## 2021-12-18 DIAGNOSIS — K219 Gastro-esophageal reflux disease without esophagitis: Secondary | ICD-10-CM | POA: Diagnosis not present

## 2021-12-18 DIAGNOSIS — E1165 Type 2 diabetes mellitus with hyperglycemia: Secondary | ICD-10-CM | POA: Diagnosis not present

## 2021-12-18 DIAGNOSIS — E669 Obesity, unspecified: Secondary | ICD-10-CM | POA: Diagnosis not present

## 2021-12-18 DIAGNOSIS — I480 Paroxysmal atrial fibrillation: Secondary | ICD-10-CM | POA: Diagnosis not present

## 2021-12-18 DIAGNOSIS — D6869 Other thrombophilia: Secondary | ICD-10-CM | POA: Diagnosis not present

## 2021-12-18 DIAGNOSIS — E1169 Type 2 diabetes mellitus with other specified complication: Secondary | ICD-10-CM | POA: Diagnosis not present

## 2021-12-22 DIAGNOSIS — G4733 Obstructive sleep apnea (adult) (pediatric): Secondary | ICD-10-CM | POA: Diagnosis not present

## 2021-12-23 ENCOUNTER — Other Ambulatory Visit: Payer: Self-pay

## 2021-12-23 ENCOUNTER — Ambulatory Visit: Payer: PPO

## 2021-12-23 DIAGNOSIS — R29898 Other symptoms and signs involving the musculoskeletal system: Secondary | ICD-10-CM

## 2021-12-23 DIAGNOSIS — G8929 Other chronic pain: Secondary | ICD-10-CM

## 2021-12-23 DIAGNOSIS — R262 Difficulty in walking, not elsewhere classified: Secondary | ICD-10-CM

## 2021-12-23 DIAGNOSIS — M6281 Muscle weakness (generalized): Secondary | ICD-10-CM | POA: Diagnosis not present

## 2021-12-23 DIAGNOSIS — R2689 Other abnormalities of gait and mobility: Secondary | ICD-10-CM

## 2021-12-23 DIAGNOSIS — M25662 Stiffness of left knee, not elsewhere classified: Secondary | ICD-10-CM

## 2021-12-23 NOTE — Therapy (Signed)
[place] ?[facility] ?[place] ?[place], [place], [address] ?Phone: [phone]   Fax:  [phone] ? ?Physical Therapy Treatment ? ?Patient Details  ?Name: Dan [name] ?MRN: [id number] ?Date of Birth: 11/01/1950 ?Referring Provider (PT): Donnald Garre, MD ? ? ?Encounter Date: 12/23/2021 ? ? PT End of Session - 12/23/21 1651   ? ? Visit Number 10   ? Number of Visits 14   ? Date for PT Re-Evaluation 01/18/22   ? Authorization Type [employer]   ? Progress Note Due on Visit 14   ? PT Start Time 1616   ? PT Stop Time 3295   pt requested session to be shortened  ? PT Time Calculation (min) 29 min   ? Activity Tolerance Patient tolerated treatment well   ? Behavior During Therapy [facility] for tasks assessed/performed   ? ?  ?  ? ?  ? ? ?Past Medical History:  ?Diagnosis Date  ? Arthritis   ? osteoarthritis left knee  ? BPH (benign prostatic hypertrophy)   ? Cholelithiasis   ? Chronic atrial fibrillation (HCC)   ? Colon polyps   ? adenomatous  ? Diabetes mellitus   ? Diverticulosis   ? Dysrhythmia   ? tx. Pradaxa- Dr. Martinique follows  ? Elevated LFTs   ? GERD (gastroesophageal reflux disease)   ? Hemorrhoids   ? Hyperlipidemia   ? Hypertension   ? Morbid obesity ([place])   ? Sleep apnea   ? CPAP nightly  ? Tubular adenoma of colon   ? ? ?Past Surgical History:  ?Procedure Laterality Date  ? KNEE ARTHROSCOPY Left 03/28/2014  ? Procedure: LEFT KNEE ARTHROSCOPY WITH DEBRIDEMENT  ;  Surgeon: Johnn Hai, MD;  Location: [facility];  Service: Orthopedics;  Laterality: Left;  ? TONSILLECTOMY    ? TOTAL KNEE ARTHROPLASTY Left 04/30/2015  ? Procedure: LEFT TOTAL KNEE ARTHROPLASTY;  Surgeon: Susa Day, MD;  Location: [facility];  Service: Orthopedics;  Laterality: Left;  ? US ECHOCARDIOGRAPHY  08/08/2006  ? ef 55-60%  ? ? ?There were no vitals filed for this visit. ? ? Subjective Assessment - 12/23/21 1622   ? ? Subjective No complaints, haven't been able to  do HEP as much because of house related things.   ? Pertinent History L TKA 04/30/15   ? Patient Stated Goals "be able to get my L leg more stabilized for better comfort/confidence on stairs"   ? Currently in Pain? No/denies   ? ?  ?  ? ?  ? ? ? ? ? ? ? ? ? ? ? ? ? ? ? ? ? ? ? ? [place] Adult PT Treatment/Exercise - 12/23/21 0001   ? ?  ? Ambulation/Gait  ? Stairs Assistance 6: Modified independent (Device/Increase time)   ? Stair Management Technique One rail Right   ? Number of Stairs 52   ? Height of Stairs 8   ? Gait Comments pt defaults to step to pattern going up and down stairs; with reciprocal pattern shows less control when leading with L LE going down and still rotates hips when fatigued to compensate   ?  ? Knee/Hip Exercises: Aerobic  ? Recumbent Bike L3 x 6 min   ?  ? Knee/Hip Exercises: Standing  ? Forward Step Up Both;10 reps;Hand Hold: 1;Step Height: 6"   ? Forward Step Up Limitations stepping up and over   ? Step Down Right;Left;10 reps;Hand Hold: 1;Step Height:  8";2 sets   ? Step Down Limitations lateral eccentric lowering to tolerance w/o dropping hip; fwd step downs x6 each leg, too much trunk rotation noted after 6 reps   ?  ? Knee/Hip Exercises: Seated  ? Sit to Sand 2 sets;10 reps;without UE support   bottom only to touch chair when lowering  ? ?  ?  ? ?  ? ? ? ? ? ? ? ? ? ? ? ? PT Short Term Goals - 11/16/21 1650   ? ?  ? PT SHORT TERM GOAL #1  ? Title Patient will be independent with initial HEP   ? Status Achieved   11/16/21  ? Target Date 11/15/21   ? ?  ?  ? ?  ? ? ? ? PT Long Term Goals - 12/07/21 1541   ? ?  ? PT LONG TERM GOAL #1  ? Title Patient will be independent with ongoing/advanced HEP for self-management at home   ? Status Partially Met   12/07/21 - met for current HEP; updated today  ? Target Date 01/18/22   ?  ? PT LONG TERM GOAL #2  ? Title Patient to demonstrate improved tissue quality and pliability as noted by reduced tissue tightness and tenderness to palpation   ? Status  Achieved   11/22/21  ?  ? PT LONG TERM GOAL #3  ? Title Patient will demonstrate improved L LE strength to >/= 4+/5 for improved stability and ease of mobility   ? Status Partially Met   ? Target Date 01/18/22   ?  ? PT LONG TERM GOAL #4  ? Title Patient will complete sit to stand transition with even weight bearing (no favoring of L LE) and no increased L knee pain   ? Status Achieved   11/22/21  ?  ? PT LONG TERM GOAL #5  ? Title Patient will negotiate stairs reciprocally with normal step pattern and minimal to no use of railing w/o limitation due to L knee pain or weakness   ? Status Partially Met   ? Target Date 01/18/22   ? ?  ?  ? ?  ? ? ? ? ? ? ? ? Plan - 12/23/21 1653   ? ? Clinical Impression Statement Dan [name] continues to have good responses to PT treatments. He reports that lately due to increased house responsibilities he has decreased with HEP compliance. Now his biggest challenge remains eccentric lowering descending stairs more noteably leading with the L LE. After becoming fatigued he tends to rotate hips with descending leading with the L LE. Cues needed for full hip extension with step up. Focused treatment on eccentric quad strengthening to improve funtional abilities with stairs. Pt requested a short session today.   ? Personal Factors and Comorbidities Age;Comorbidity 3+;Fitness;Past/Current Experience;Time since onset of injury/illness/exacerbation;Profession   ? Comorbidities L TKA 04/30/15, chronic a-fib, HTN, borderline DM, OSA, lumbar DDD, HLD, B tinnitus   ? PT Frequency 2x / week   ? PT Duration 6 weeks   4-6 weeks  ? PT Treatment/Interventions ADLs/Self Care Home Management;Cryotherapy;Electrical Stimulation;Iontophoresis 27m/ml Dexamethasone;Moist Heat;Ultrasound;Gait training;Stair training;Functional mobility training;Therapeutic activities;Therapeutic exercise;Balance training;Neuromuscular re-education;Patient/family education;Manual techniques;Scar mobilization;Passive range of  motion;Dry needling;Taping;Vasopneumatic Device   ? PT Next Visit Plan hip/glute strengthening and eccentric quad strengthening, tib anterior strengthening; modalities PRN   ? PT Home Exercise Plan Access Code: [email or web address](1/30, updated 2/7, 2/14 & 3/7)   ? Consulted and Agree with Plan of Care Patient   ? ?  ?  ? ?  ? ? ?  Patient will benefit from skilled therapeutic intervention in order to improve the following deficits and impairments:  Abnormal gait, Decreased activity tolerance, Decreased balance, Decreased mobility, Decreased range of motion, Decreased strength, Increased fascial restricitons, Increased muscle spasms, Impaired perceived functional ability, Impaired flexibility, Improper body mechanics, Postural dysfunction, Pain ? ?Visit Diagnosis: ?Muscle weakness (generalized) ? ?Other abnormalities of gait and mobility ? ?Difficulty in walking, not elsewhere classified ? ?Chronic pain of left knee ? ?Stiffness of left knee, not elsewhere classified ? ?Other symptoms and signs involving the musculoskeletal system ? ? ? ? ?Problem List ?Patient Active Problem List  ? Diagnosis Date Noted  ? Tinnitus, bilateral 08/08/2018  ? Lumbar pain 02/23/2018  ? Degeneration of lumbar intervertebral disc 02/23/2018  ? Increased body mass index 02/23/2018  ? History of total knee arthroplasty 10/24/2017  ? Osteoarthritis 10/24/2017  ? Acute recurrent pansinusitis 09/13/2016  ? Chronic rhinitis 09/13/2016  ? Obstructive sleep apnea 09/13/2016  ? Primary osteoarthritis of left knee 04/30/2015  ? Left knee DJD 04/30/2015  ? Pneumonia 11/02/2014  ? Hypertension   ? Diabetes mellitus ([place])   ? Hyperlipidemia   ? OTHER AND UNSPECIFIED COAGULATION DEFECTS 09/28/2010  ? Chronic a-fib ([place]) 09/28/2010  ? PERSONAL HX COLONIC POLYPS 09/28/2010  ? ? ?Artist Pais, PTA ?12/23/2021, 5:11 PM ? ?[place] ?[facility] ?[place] ?[place], [place], [address] ?Phone: [phone]    Fax:  [phone] ? ?Name: Dan [name] ?MRN: [id number] ?Date of Birth: [birth year] ? ? ? ?

## 2021-12-28 ENCOUNTER — Ambulatory Visit: Payer: PPO | Admitting: Physical Therapy

## 2021-12-28 DIAGNOSIS — L821 Other seborrheic keratosis: Secondary | ICD-10-CM | POA: Diagnosis not present

## 2021-12-28 DIAGNOSIS — D225 Melanocytic nevi of trunk: Secondary | ICD-10-CM | POA: Diagnosis not present

## 2021-12-28 DIAGNOSIS — D1801 Hemangioma of skin and subcutaneous tissue: Secondary | ICD-10-CM | POA: Diagnosis not present

## 2021-12-28 DIAGNOSIS — L570 Actinic keratosis: Secondary | ICD-10-CM | POA: Diagnosis not present

## 2022-01-03 ENCOUNTER — Ambulatory Visit: Payer: PPO | Attending: Specialist

## 2022-01-03 DIAGNOSIS — R262 Difficulty in walking, not elsewhere classified: Secondary | ICD-10-CM | POA: Diagnosis not present

## 2022-01-03 DIAGNOSIS — R2689 Other abnormalities of gait and mobility: Secondary | ICD-10-CM | POA: Diagnosis not present

## 2022-01-03 DIAGNOSIS — M25662 Stiffness of left knee, not elsewhere classified: Secondary | ICD-10-CM

## 2022-01-03 DIAGNOSIS — R29898 Other symptoms and signs involving the musculoskeletal system: Secondary | ICD-10-CM | POA: Diagnosis not present

## 2022-01-03 DIAGNOSIS — G8929 Other chronic pain: Secondary | ICD-10-CM

## 2022-01-03 DIAGNOSIS — M6281 Muscle weakness (generalized): Secondary | ICD-10-CM

## 2022-01-03 DIAGNOSIS — M25562 Pain in left knee: Secondary | ICD-10-CM | POA: Diagnosis not present

## 2022-01-03 NOTE — Therapy (Addendum)
[facility] [address]  [place], [place], [address] Phone: [phone]   Fax:  [phone]  Physical Therapy Treatment / Discharge Summary  Patient Details  Name: Dan [name] MRN: [id number] Date of Birth: 09-03-1951 Referring Provider (PT): Donnald Garre, MD   Encounter Date: 01/03/2022   PT End of Session - 01/03/22 1702     Visit Number 11    Number of Visits 14    Date for PT Re-Evaluation 01/18/22    Authorization Type [employer]    Progress Note Due on Visit 14    PT Start Time 1617    PT Stop Time 1659    PT Time Calculation (min) 42 min    Activity Tolerance Patient tolerated treatment well    Behavior During Therapy [facility] for tasks assessed/performed             Past Medical History:  Diagnosis Date   Arthritis    osteoarthritis left knee   BPH (benign prostatic hypertrophy)    Cholelithiasis    Chronic atrial fibrillation ([place])    Colon polyps    adenomatous   Diabetes mellitus    Diverticulosis    Dysrhythmia    tx. Pradaxa- Dr. Martinique follows   Elevated LFTs    GERD (gastroesophageal reflux disease)    Hemorrhoids    Hyperlipidemia    Hypertension    Morbid obesity ([place])    Sleep apnea    CPAP nightly   Tubular adenoma of colon     Past Surgical History:  Procedure Laterality Date   KNEE ARTHROSCOPY Left 03/28/2014   Procedure: LEFT KNEE ARTHROSCOPY WITH DEBRIDEMENT  ;  Surgeon: Johnn Hai, MD;  Location: [place];  Service: Orthopedics;  Laterality: Left;   TONSILLECTOMY     TOTAL KNEE ARTHROPLASTY Left 04/30/2015   Procedure: LEFT TOTAL KNEE ARTHROPLASTY;  Surgeon: Susa Day, MD;  Location: [facility];  Service: Orthopedics;  Laterality: Left;   US ECHOCARDIOGRAPHY  08/08/2006   ef 55-60%    There were no vitals filed for this visit.   Subjective Assessment - 01/03/22 1625     Subjective Pt reports no knee pain for past 2 weeks but now has  some in calves when first waking up.    Pertinent History L TKA 04/30/15    Patient Stated Goals "be able to get my L leg more stabilized for better comfort/confidence on stairs"    Currently in Pain? No/denies                [facility] PT Assessment - 01/03/22 0001       Strength   Right Hip Flexion 4+/5    Right Hip Extension 4/5    Right Hip ABduction 5/5    Right Hip ADduction 4+/5    Left Hip Flexion 4+/5    Left Hip Extension 4+/5    Left Hip ABduction 4+/5    Left Hip ADduction 4/5    Right Knee Flexion 5/5    Right Knee Extension 5/5    Left Knee Flexion 5/5    Left Knee Extension 5/5                           [facility] Adult PT Treatment/Exercise - 01/03/22 0001       Knee/Hip Exercises: Stretches   Gastroc Stretch Both;1 rep;20 seconds    Gastroc Stretch Limitations runner stretch  Knee/Hip Exercises: Aerobic   Recumbent Bike L4x 6 min      Knee/Hip Exercises: Standing   Hip Flexion Right;Left;10 reps;Stengthening;Knee straight    Hip Flexion Limitations blue TB at ankle; UE support on back of chair for balance    Hip ADduction Right;Left;10 reps;Strengthening    Hip ADduction Limitations blue TB at ankle; UE support on back of chair for balance    Hip Abduction Right;Left;10 reps;Stengthening;Knee straight    Abduction Limitations blue TB at ankle; UE support on back of chair for balance    Hip Extension Right;Left;10 reps;Stengthening;Knee straight    Extension Limitations blue TB at ankle; UE support on back of chair for balance                       PT Short Term Goals - 11/16/21 1650       PT SHORT TERM GOAL #1   Title Patient will be independent with initial HEP    Status Achieved   11/16/21   Target Date 11/15/21               PT Long Term Goals - 12/07/21 1541       PT LONG TERM GOAL #1   Title Patient will be independent with ongoing/advanced HEP for self-management at home    Status Partially Met    12/07/21 - met for current HEP; updated today   Target Date 01/18/22      PT LONG TERM GOAL #2   Title Patient to demonstrate improved tissue quality and pliability as noted by reduced tissue tightness and tenderness to palpation    Status Achieved   11/22/21     PT LONG TERM GOAL #3   Title Patient will demonstrate improved L LE strength to >/= 4+/5 for improved stability and ease of mobility    Status Partially Met    Target Date 01/18/22      PT LONG TERM GOAL #4   Title Patient will complete sit to stand transition with even weight bearing (no favoring of L LE) and no increased L knee pain    Status Achieved   11/22/21     PT LONG TERM GOAL #5   Title Patient will negotiate stairs reciprocally with normal step pattern and minimal to no use of railing w/o limitation due to L knee pain or weakness    Status Partially Met    Target Date 01/18/22                   Plan - 01/03/22 1703     Clinical Impression Statement Pt shows some weakness still in the hip adductors and with eccentric quad lowering. Progressed HEP to blue TB, as pt reports the green is easy now. Tried to work on [employer] step downs however too much pelvic rotation to compensate for quads so deferred this exercise for today. Continue working on ecc quad exercises and medial hip strengthening.    Personal Factors and Comorbidities Age;Comorbidity 3+;Fitness;Past/Current Experience;Time since onset of injury/illness/exacerbation;Profession    Comorbidities L TKA 04/30/15, chronic a-fib, HTN, borderline DM, OSA, lumbar DDD, HLD, B tinnitus    PT Frequency 2x / week    PT Duration 6 weeks   4-6 weeks   PT Treatment/Interventions ADLs/Self Care Home Management;Cryotherapy;Electrical Stimulation;Iontophoresis 82m/ml Dexamethasone;Moist Heat;Ultrasound;Gait training;Stair training;Functional mobility training;Therapeutic activities;Therapeutic exercise;Balance training;Neuromuscular re-education;Patient/family education;Manual  techniques;Scar mobilization;Passive range of motion;Dry needling;Taping;Vasopneumatic Device    PT Next Visit Plan hip/glute strengthening and eccentric  quad strengthening, tib anterior strengthening; modalities PRN    PT Home Exercise Plan Access Code: [email or web address] (1/30, updated 2/7, 2/14 & 3/7)    Consulted and Agree with Plan of Care Patient             Patient will benefit from skilled therapeutic intervention in order to improve the following deficits and impairments:  Abnormal gait, Decreased activity tolerance, Decreased balance, Decreased mobility, Decreased range of motion, Decreased strength, Increased fascial restricitons, Increased muscle spasms, Impaired perceived functional ability, Impaired flexibility, Improper body mechanics, Postural dysfunction, Pain  Visit Diagnosis: Muscle weakness (generalized)  Other abnormalities of gait and mobility  Difficulty in walking, not elsewhere classified  Chronic pain of left knee  Stiffness of left knee, not elsewhere classified  Other symptoms and signs involving the musculoskeletal system     Problem List Patient Active Problem List   Diagnosis Date Noted   Tinnitus, bilateral 08/08/2018   Lumbar pain 02/23/2018   Degeneration of lumbar intervertebral disc 02/23/2018   Increased body mass index 02/23/2018   History of total knee arthroplasty 10/24/2017   Osteoarthritis 10/24/2017   Acute recurrent pansinusitis 09/13/2016   Chronic rhinitis 09/13/2016   Obstructive sleep apnea 09/13/2016   Primary osteoarthritis of left knee 04/30/2015   Left knee DJD 04/30/2015   Pneumonia 11/02/2014   Hypertension    Diabetes mellitus ([place])    Hyperlipidemia    OTHER AND UNSPECIFIED COAGULATION DEFECTS 09/28/2010   Chronic a-fib ([place]) 09/28/2010   PERSONAL HX COLONIC POLYPS 09/28/2010    Artist Pais, PTA 01/03/2022, 6:14 PM  [facility] [address]  [place], [place], [address] Phone: [phone]   Fax:  [phone]  Name: Dan [name] MRN: [id number] Date of Birth: 1951/03/02   PHYSICAL THERAPY DISCHARGE SUMMARY  Visits from Start of Care: 11  Current functional level related to goals / functional outcomes:   Refer to above clinical impression for status as of last visit on 01/03/2022. Patient cancelled x 3 and NS x 1 for remaining scheduled visits and has not returned to PT in > 30 day, therefore will proceed with discharge from PT for this episode.   Remaining deficits:   As above. Unable to formally assess status as of discharge due to failure to return to PT.   Education / Equipment:   HEP   Patient agrees to discharge. Patient goals were partially met. Patient is being discharged due to not returning since the last visit.  Percival Spanish, PT, MPT 03/10/22, 5:23 PM  [facility] [address]  [place], [place], [address] Phone: [phone]   Fax:  [phone]

## 2022-01-04 ENCOUNTER — Ambulatory Visit (HOSPITAL_COMMUNITY)
Admission: RE | Admit: 2022-01-04 | Discharge: 2022-01-04 | Disposition: A | Discharge status: Home / Self Care | Payer: PPO | Source: Ambulatory Visit | Attending: Cardiovascular Disease | Admitting: Cardiovascular Disease

## 2022-01-04 ENCOUNTER — Other Ambulatory Visit: Payer: Self-pay | Admitting: Cardiology

## 2022-01-04 ENCOUNTER — Ambulatory Visit: Payer: PPO

## 2022-01-04 DIAGNOSIS — I6521 Occlusion and stenosis of right carotid artery: Secondary | ICD-10-CM | POA: Diagnosis not present

## 2022-01-04 DIAGNOSIS — I6529 Occlusion and stenosis of unspecified carotid artery: Secondary | ICD-10-CM | POA: Diagnosis not present

## 2022-01-10 ENCOUNTER — Ambulatory Visit: Payer: PPO | Admitting: Physical Therapy

## 2022-01-13 ENCOUNTER — Encounter: Payer: PPO | Admitting: Physical Therapy

## 2022-01-17 ENCOUNTER — Encounter: Payer: PPO | Admitting: Physical Therapy

## 2022-01-22 DIAGNOSIS — G4733 Obstructive sleep apnea (adult) (pediatric): Secondary | ICD-10-CM | POA: Diagnosis not present

## 2022-02-03 DIAGNOSIS — G4733 Obstructive sleep apnea (adult) (pediatric): Secondary | ICD-10-CM | POA: Diagnosis not present

## 2022-02-14 DIAGNOSIS — M25562 Pain in left knee: Secondary | ICD-10-CM | POA: Diagnosis not present

## 2022-02-17 DIAGNOSIS — M25562 Pain in left knee: Secondary | ICD-10-CM | POA: Diagnosis not present

## 2022-02-21 DIAGNOSIS — E785 Hyperlipidemia, unspecified: Secondary | ICD-10-CM | POA: Diagnosis not present

## 2022-02-21 DIAGNOSIS — Z7901 Long term (current) use of anticoagulants: Secondary | ICD-10-CM | POA: Diagnosis not present

## 2022-02-21 DIAGNOSIS — I480 Paroxysmal atrial fibrillation: Secondary | ICD-10-CM | POA: Diagnosis not present

## 2022-02-21 DIAGNOSIS — E1169 Type 2 diabetes mellitus with other specified complication: Secondary | ICD-10-CM | POA: Diagnosis not present

## 2022-02-21 DIAGNOSIS — I519 Heart disease, unspecified: Secondary | ICD-10-CM | POA: Diagnosis not present

## 2022-02-21 DIAGNOSIS — G4733 Obstructive sleep apnea (adult) (pediatric): Secondary | ICD-10-CM | POA: Diagnosis not present

## 2022-02-21 DIAGNOSIS — E669 Obesity, unspecified: Secondary | ICD-10-CM | POA: Diagnosis not present

## 2022-02-21 DIAGNOSIS — I10 Essential (primary) hypertension: Secondary | ICD-10-CM | POA: Diagnosis not present

## 2022-03-24 DIAGNOSIS — G4733 Obstructive sleep apnea (adult) (pediatric): Secondary | ICD-10-CM | POA: Diagnosis not present

## 2022-04-23 DIAGNOSIS — G4733 Obstructive sleep apnea (adult) (pediatric): Secondary | ICD-10-CM | POA: Diagnosis not present

## 2022-05-24 DIAGNOSIS — G4733 Obstructive sleep apnea (adult) (pediatric): Secondary | ICD-10-CM | POA: Diagnosis not present

## 2022-06-01 DIAGNOSIS — E1169 Type 2 diabetes mellitus with other specified complication: Secondary | ICD-10-CM | POA: Diagnosis not present

## 2022-06-01 DIAGNOSIS — R7989 Other specified abnormal findings of blood chemistry: Secondary | ICD-10-CM | POA: Diagnosis not present

## 2022-06-04 DIAGNOSIS — G4733 Obstructive sleep apnea (adult) (pediatric): Secondary | ICD-10-CM | POA: Diagnosis not present

## 2022-06-24 ENCOUNTER — Telehealth: Payer: Self-pay | Admitting: Cardiology

## 2022-06-24 DIAGNOSIS — G4733 Obstructive sleep apnea (adult) (pediatric): Secondary | ICD-10-CM | POA: Diagnosis not present

## 2022-06-24 NOTE — Telephone Encounter (Signed)
Pt c/o medication issue:  1. Name of Medication: metoprolol   2. How are you currently taking this medication (dosage and times per day)?    3. Are you having a reaction (difficulty breathing--STAT)? no  4. What is your medication issue? Called in to say that the patient been taking the medication,when it wasn't listed under his current medication list.  Calling to see if need to be seen by our office or will the patient be fine.

## 2022-06-24 NOTE — Telephone Encounter (Signed)
Called and spoke with the patient. He stated that he has been taking Metoprolol Succinate 50 mg once daily for the past few months. He realized after he saw his PCP recently that this was stopped but he is unaware of when. He stated that he gets confused about his medications.  We did go over all of his medications while on the phone. Called his pharmacy to verify the medications. His list has been updated. He has been made aware to stop the Metoprolol Succinate since he is on Bystolic but stated that he does not have any refills and that Dr. Jacquiline Doe office told him not to take it.   Bystolic 40 mg bid?? Hydralazine 50 mg bid Eliquis 5 mg bid Spironolactone 25 mg once daily Lisinopril 40 mg once daily Furosemide 20 mg once daily Losartan 100 mg once daily Metformin 1000 mg bid Ezetimibe-simvastatin (Vytorin) 10-20 mg once daily Amlodipine 10 mg once daily  He has been asked to check his blood pressure 1-2 hours after taking his medication and keep a log of his blood pressures and heart rates and to call us back next week with an update.

## 2022-06-24 NOTE — Addendum Note (Signed)
Addended by: Ricci Barker on: 06/24/2022 10:49 AM   Modules accepted: Orders

## 2022-06-30 DIAGNOSIS — K1321 Leukoplakia of oral mucosa, including tongue: Secondary | ICD-10-CM | POA: Diagnosis not present

## 2022-07-01 ENCOUNTER — Ambulatory Visit: Payer: PPO | Admitting: Podiatry

## 2022-07-01 ENCOUNTER — Other Ambulatory Visit: Payer: Self-pay | Admitting: Podiatry

## 2022-07-01 ENCOUNTER — Encounter: Payer: Self-pay | Admitting: Podiatry

## 2022-07-01 ENCOUNTER — Ambulatory Visit (INDEPENDENT_AMBULATORY_CARE_PROVIDER_SITE_OTHER): Payer: PPO

## 2022-07-01 DIAGNOSIS — M76821 Posterior tibial tendinitis, right leg: Secondary | ICD-10-CM

## 2022-07-01 DIAGNOSIS — M76822 Posterior tibial tendinitis, left leg: Secondary | ICD-10-CM | POA: Diagnosis not present

## 2022-07-01 DIAGNOSIS — R609 Edema, unspecified: Secondary | ICD-10-CM

## 2022-07-01 DIAGNOSIS — E1140 Type 2 diabetes mellitus with diabetic neuropathy, unspecified: Secondary | ICD-10-CM

## 2022-07-01 DIAGNOSIS — R600 Localized edema: Secondary | ICD-10-CM

## 2022-07-01 DIAGNOSIS — E1149 Type 2 diabetes mellitus with other diabetic neurological complication: Secondary | ICD-10-CM

## 2022-07-01 MED ORDER — TRIAMCINOLONE ACETONIDE 10 MG/ML IJ SUSP: 10.0000 mg | Freq: Once | INTRAMUSCULAR | Status: DC

## 2022-07-01 MED ORDER — TRIAMCINOLONE ACETONIDE 10 MG/ML IJ SUSP
20.0000 mg | Freq: Once | INTRAMUSCULAR | Status: AC
  Administered 2022-07-01: 20 mg

## 2022-07-01 NOTE — Progress Notes (Signed)
Subjective:   Patient ID: Dan [name], male   DOB: 71 y.o.   MRN: [id number]   HPI Patient presents chronic pain in his feet long-term diabetic moderate obesity and swelling with orthotics which have been effective but are starting to lose their ability to hold his arch up properly.   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  Patient will be seen back to recheck  X-rays indicate significant depression plantar arch bilateral indications of arthritis spur formation

## 2022-07-08 DIAGNOSIS — M5416 Radiculopathy, lumbar region: Secondary | ICD-10-CM | POA: Diagnosis not present

## 2022-07-08 DIAGNOSIS — Z96659 Presence of unspecified artificial knee joint: Secondary | ICD-10-CM | POA: Diagnosis not present

## 2022-07-26 ENCOUNTER — Other Ambulatory Visit (HOSPITAL_COMMUNITY): Payer: Self-pay

## 2022-07-26 MED ORDER — LISINOPRIL 40 MG PO TABS
40.0000 mg | ORAL_TABLET | Freq: Every day | ORAL | 4 refills | Status: DC
  Filled 2022-07-26 – 2022-07-27 (×2): qty 90, 90d supply, fill #0
  Filled 2022-07-27: qty 84, 84d supply, fill #0

## 2022-07-27 ENCOUNTER — Other Ambulatory Visit (HOSPITAL_COMMUNITY): Payer: Self-pay

## 2022-08-16 ENCOUNTER — Other Ambulatory Visit: Payer: Self-pay

## 2022-08-16 MED ORDER — SPIRONOLACTONE 25 MG PO TABS: 25.0000 mg | ORAL_TABLET | Freq: Every day | ORAL | 3 refills | Status: DC

## 2022-08-31 DIAGNOSIS — E785 Hyperlipidemia, unspecified: Secondary | ICD-10-CM | POA: Diagnosis not present

## 2022-08-31 DIAGNOSIS — Z1211 Encounter for screening for malignant neoplasm of colon: Secondary | ICD-10-CM | POA: Diagnosis not present

## 2022-08-31 DIAGNOSIS — Z125 Encounter for screening for malignant neoplasm of prostate: Secondary | ICD-10-CM | POA: Diagnosis not present

## 2022-08-31 DIAGNOSIS — I10 Essential (primary) hypertension: Secondary | ICD-10-CM | POA: Diagnosis not present

## 2022-08-31 DIAGNOSIS — E1169 Type 2 diabetes mellitus with other specified complication: Secondary | ICD-10-CM | POA: Diagnosis not present

## 2022-08-31 DIAGNOSIS — I519 Heart disease, unspecified: Secondary | ICD-10-CM | POA: Diagnosis not present

## 2022-09-07 DIAGNOSIS — I10 Essential (primary) hypertension: Secondary | ICD-10-CM | POA: Diagnosis not present

## 2022-09-07 DIAGNOSIS — Z1339 Encounter for screening examination for other mental health and behavioral disorders: Secondary | ICD-10-CM | POA: Diagnosis not present

## 2022-09-07 DIAGNOSIS — Z6837 Body mass index (BMI) 37.0-37.9, adult: Secondary | ICD-10-CM | POA: Diagnosis not present

## 2022-09-07 DIAGNOSIS — I480 Paroxysmal atrial fibrillation: Secondary | ICD-10-CM | POA: Diagnosis not present

## 2022-09-07 DIAGNOSIS — E1142 Type 2 diabetes mellitus with diabetic polyneuropathy: Secondary | ICD-10-CM | POA: Diagnosis not present

## 2022-09-07 DIAGNOSIS — Z1331 Encounter for screening for depression: Secondary | ICD-10-CM | POA: Diagnosis not present

## 2022-09-07 DIAGNOSIS — Z7901 Long term (current) use of anticoagulants: Secondary | ICD-10-CM | POA: Diagnosis not present

## 2022-09-07 DIAGNOSIS — R82998 Other abnormal findings in urine: Secondary | ICD-10-CM | POA: Diagnosis not present

## 2022-09-07 DIAGNOSIS — E669 Obesity, unspecified: Secondary | ICD-10-CM | POA: Diagnosis not present

## 2022-09-07 DIAGNOSIS — E1169 Type 2 diabetes mellitus with other specified complication: Secondary | ICD-10-CM | POA: Diagnosis not present

## 2022-09-07 DIAGNOSIS — E785 Hyperlipidemia, unspecified: Secondary | ICD-10-CM | POA: Diagnosis not present

## 2022-09-07 DIAGNOSIS — Z0000 Encounter for general adult medical examination without abnormal findings: Secondary | ICD-10-CM | POA: Diagnosis not present

## 2022-09-08 DIAGNOSIS — G4733 Obstructive sleep apnea (adult) (pediatric): Secondary | ICD-10-CM | POA: Diagnosis not present

## 2022-09-13 ENCOUNTER — Other Ambulatory Visit: Payer: Self-pay | Admitting: Cardiology

## 2022-09-13 DIAGNOSIS — D3132 Benign neoplasm of left choroid: Secondary | ICD-10-CM | POA: Diagnosis not present

## 2022-09-13 DIAGNOSIS — E119 Type 2 diabetes mellitus without complications: Secondary | ICD-10-CM | POA: Diagnosis not present

## 2022-09-13 DIAGNOSIS — H5203 Hypermetropia, bilateral: Secondary | ICD-10-CM | POA: Diagnosis not present

## 2022-09-14 NOTE — Telephone Encounter (Addendum)
Prescription refill request for Eliquis received. Indication:  AF Last office visit: 12/14/21  P Martinique MD Scr: 0.9 on 09/01/21 Age: 71 Weight: 128.3kg  Based on above findings Eliquis 26m twice daily is the appropriate dose.  Requested CBC/BMP be done at upcoming appt with Dr JMartinique  Refill approved.

## 2022-10-18 ENCOUNTER — Telehealth: Payer: Self-pay | Admitting: Podiatry

## 2022-10-18 NOTE — Telephone Encounter (Signed)
Lmom to call back to schedule appt to pick up orthotics.   Charges pending

## 2022-10-18 NOTE — Telephone Encounter (Signed)
Sent preauth for orthotics.

## 2022-10-19 ENCOUNTER — Encounter: Payer: Self-pay | Admitting: Internal Medicine

## 2022-10-26 ENCOUNTER — Ambulatory Visit (INDEPENDENT_AMBULATORY_CARE_PROVIDER_SITE_OTHER): Payer: PPO

## 2022-10-26 DIAGNOSIS — M76821 Posterior tibial tendinitis, right leg: Secondary | ICD-10-CM | POA: Diagnosis not present

## 2022-10-26 DIAGNOSIS — M76822 Posterior tibial tendinitis, left leg: Secondary | ICD-10-CM | POA: Diagnosis not present

## 2022-10-26 NOTE — Progress Notes (Signed)
Patient presents today to pick up custom molded foot orthotics recommended by Dr. Paulla Dolly.   Orthotics were dispensed and fit was satisfactory. Reviewed instructions for break-in and wear. Written instructions given to patient.  Patient will follow up as needed.   [facility] - order # [id number]

## 2022-11-17 ENCOUNTER — Telehealth: Payer: Self-pay | Admitting: Cardiology

## 2022-11-17 NOTE — Telephone Encounter (Signed)
*  STAT* If patient is at the pharmacy, call can be transferred to refill team.   1. Which medications need to be refilled? (please list name of each medication and dose if known) spironolactone (ALDACTONE) 25 MG tablet   2. Which pharmacy/location (including street and city if local pharmacy) is medication to be sent to? [id number] - [place], [place] - [address]   3. Do they need a 30 day or 90 day supply? [place]

## 2022-11-21 ENCOUNTER — Other Ambulatory Visit: Payer: Self-pay

## 2022-11-21 MED ORDER — SPIRONOLACTONE 25 MG PO TABS: 25.0000 mg | ORAL_TABLET | Freq: Every day | ORAL | 1 refills | Status: DC

## 2022-11-29 DIAGNOSIS — M5416 Radiculopathy, lumbar region: Secondary | ICD-10-CM | POA: Diagnosis not present

## 2022-11-29 DIAGNOSIS — M1712 Unilateral primary osteoarthritis, left knee: Secondary | ICD-10-CM | POA: Diagnosis not present

## 2022-11-30 DIAGNOSIS — K529 Noninfective gastroenteritis and colitis, unspecified: Secondary | ICD-10-CM | POA: Diagnosis not present

## 2022-11-30 DIAGNOSIS — J029 Acute pharyngitis, unspecified: Secondary | ICD-10-CM | POA: Diagnosis not present

## 2022-11-30 DIAGNOSIS — R197 Diarrhea, unspecified: Secondary | ICD-10-CM | POA: Diagnosis not present

## 2022-11-30 DIAGNOSIS — R112 Nausea with vomiting, unspecified: Secondary | ICD-10-CM | POA: Diagnosis not present

## 2022-12-07 ENCOUNTER — Other Ambulatory Visit: Payer: Self-pay | Admitting: Cardiology

## 2022-12-20 DIAGNOSIS — G4733 Obstructive sleep apnea (adult) (pediatric): Secondary | ICD-10-CM | POA: Diagnosis not present

## 2022-12-21 ENCOUNTER — Telehealth: Payer: Self-pay | Admitting: Internal Medicine

## 2022-12-21 NOTE — Telephone Encounter (Signed)
Patient called states he has been having a lot of diarrhea for a few days now, has taken Imodium with no relief. Seeking advise.

## 2022-12-22 NOTE — Telephone Encounter (Signed)
Dan [name] pt calling states he has been having diarrhea for the past couple of weeks. He has tried Imodium for several days but as soon as he stops the diarrhea comes back. He reports some abdominal cramping with the diarrhea. Pt would like to know what else he could try/take. Please advise as DOD.

## 2022-12-22 NOTE — Telephone Encounter (Signed)
Lets check -Stool studies for GI Pathogen (includes C. Diff), fecal elastase and Calprotectin. -Check CBC, CMP, CRP, TSH -He has appt with Dr. Henrene Pastor in coming days -Stop any OTC vitamins or any supplements.  Please make sure that he is not taking Mg supplements -For now can continue Imodium as needed RG

## 2022-12-23 ENCOUNTER — Other Ambulatory Visit: Payer: Self-pay

## 2022-12-23 DIAGNOSIS — R197 Diarrhea, unspecified: Secondary | ICD-10-CM

## 2022-12-23 NOTE — Telephone Encounter (Signed)
Spoke with pt and he is aware of recommendations and will come for labs next week.

## 2022-12-27 ENCOUNTER — Other Ambulatory Visit (INDEPENDENT_AMBULATORY_CARE_PROVIDER_SITE_OTHER): Payer: PPO

## 2022-12-27 DIAGNOSIS — R197 Diarrhea, unspecified: Secondary | ICD-10-CM | POA: Diagnosis not present

## 2022-12-27 LAB — CBC WITH DIFFERENTIAL/PLATELET
Basophils Absolute: 0.1 10*3/uL (ref 0.0–0.1)
Basophils Relative: 1 % (ref 0.0–3.0)
Eosinophils Absolute: 0.6 10*3/uL (ref 0.0–0.7)
Eosinophils Relative: 7.8 % — ABNORMAL HIGH (ref 0.0–5.0)
HCT: 44.9 % (ref 39.0–52.0)
Hemoglobin: 15.2 g/dL (ref 13.0–17.0)
Lymphocytes Relative: 23.5 % (ref 12.0–46.0)
Lymphs Abs: 1.7 10*3/uL (ref 0.7–4.0)
MCHC: 33.9 g/dL (ref 30.0–36.0)
MCV: 90.3 fl (ref 78.0–100.0)
Monocytes Absolute: 0.5 10*3/uL (ref 0.1–1.0)
Monocytes Relative: 7.6 % (ref 3.0–12.0)
Neutro Abs: 4.3 10*3/uL (ref 1.4–7.7)
Neutrophils Relative %: 60.1 % (ref 43.0–77.0)
Platelets: 195 10*3/uL (ref 150.0–400.0)
RBC: 4.97 Mil/uL (ref 4.22–5.81)
RDW: 13.5 % (ref 11.5–15.5)
WBC: 7.2 10*3/uL (ref 4.0–10.5)

## 2022-12-27 LAB — COMPREHENSIVE METABOLIC PANEL
ALT: 28 U/L (ref 0–53)
AST: 21 U/L (ref 0–37)
Albumin: 4.3 g/dL (ref 3.5–5.2)
Alkaline Phosphatase: 48 U/L (ref 39–117)
BUN: 17 mg/dL (ref 6–23)
CO2: 30 mEq/L (ref 19–32)
Calcium: 11.2 mg/dL — ABNORMAL HIGH (ref 8.4–10.5)
Chloride: 100 mEq/L (ref 96–112)
Creatinine, Ser: 0.91 mg/dL (ref 0.40–1.50)
GFR: 83.36 mL/min (ref >60.00)
Glucose, Bld: 217 mg/dL — ABNORMAL HIGH (ref 70–99)
Potassium: 5.3 mEq/L — ABNORMAL HIGH (ref 3.5–5.1)
Sodium: 137 mEq/L (ref 135–145)
Total Bilirubin: 1 mg/dL (ref 0.2–1.2)
Total Protein: 7.4 g/dL (ref 6.0–8.3)

## 2022-12-27 LAB — C-REACTIVE PROTEIN: CRP: <1 mg/dL (ref 0.5–20.0)

## 2022-12-27 LAB — TSH: TSH: 3.15 u[IU]/mL (ref 0.35–5.50)

## 2023-01-03 DIAGNOSIS — L918 Other hypertrophic disorders of the skin: Secondary | ICD-10-CM | POA: Diagnosis not present

## 2023-01-03 DIAGNOSIS — L570 Actinic keratosis: Secondary | ICD-10-CM | POA: Diagnosis not present

## 2023-01-03 DIAGNOSIS — I872 Venous insufficiency (chronic) (peripheral): Secondary | ICD-10-CM | POA: Diagnosis not present

## 2023-01-03 DIAGNOSIS — L821 Other seborrheic keratosis: Secondary | ICD-10-CM | POA: Diagnosis not present

## 2023-01-03 DIAGNOSIS — I8312 Varicose veins of left lower extremity with inflammation: Secondary | ICD-10-CM | POA: Diagnosis not present

## 2023-01-03 DIAGNOSIS — I8311 Varicose veins of right lower extremity with inflammation: Secondary | ICD-10-CM | POA: Diagnosis not present

## 2023-01-03 DIAGNOSIS — D1801 Hemangioma of skin and subcutaneous tissue: Secondary | ICD-10-CM | POA: Diagnosis not present

## 2023-01-05 ENCOUNTER — Other Ambulatory Visit: Payer: PPO

## 2023-01-05 DIAGNOSIS — R197 Diarrhea, unspecified: Secondary | ICD-10-CM

## 2023-01-08 ENCOUNTER — Other Ambulatory Visit: Payer: Self-pay | Admitting: Gastroenterology

## 2023-01-08 DIAGNOSIS — A0472 Enterocolitis due to Clostridium difficile, not specified as recurrent: Secondary | ICD-10-CM

## 2023-01-08 LAB — GI PROFILE, STOOL, PCR

## 2023-01-08 MED ORDER — VANCOMYCIN HCL 125 MG PO CAPS: 125.0000 mg | ORAL_CAPSULE | Freq: Four times a day (QID) | ORAL | 0 refills | Status: AC

## 2023-01-08 NOTE — Progress Notes (Signed)
+   C diff Pt of Dr Marina Goodell.

## 2023-01-10 ENCOUNTER — Encounter: Payer: Self-pay | Admitting: Internal Medicine

## 2023-01-10 ENCOUNTER — Ambulatory Visit (INDEPENDENT_AMBULATORY_CARE_PROVIDER_SITE_OTHER): Payer: PPO | Admitting: Internal Medicine

## 2023-01-10 VITALS — BP 132/80 | HR 93 | Ht 73.0 in | Wt 282.0 lb

## 2023-01-10 DIAGNOSIS — Z7901 Long term (current) use of anticoagulants: Secondary | ICD-10-CM

## 2023-01-10 DIAGNOSIS — K7460 Unspecified cirrhosis of liver: Secondary | ICD-10-CM | POA: Diagnosis not present

## 2023-01-10 DIAGNOSIS — Z86010 Personal history of colonic polyps: Secondary | ICD-10-CM | POA: Diagnosis not present

## 2023-01-10 DIAGNOSIS — A0472 Enterocolitis due to Clostridium difficile, not specified as recurrent: Secondary | ICD-10-CM

## 2023-01-10 NOTE — Patient Instructions (Signed)
_______________________________________________________  If your blood pressure at your visit was 140/90 or greater, please contact your primary care physician to follow up on this.  _______________________________________________________  If you are age 74 or older, your body mass index should be between 23-30. Your Body mass index is 37.21 kg/m. If this is out of the aforementioned range listed, please consider follow up with your Primary Care Provider.  If you are age 27 or younger, your body mass index should be between 19-25. Your Body mass index is 37.21 kg/m. If this is out of the aformentioned range listed, please consider follow up with your Primary Care Provider.   ________________________________________________________  The [facility] providers would like to encourage you to use [facility] to communicate with providers for non-urgent requests or questions.  Due to long hold times on the telephone, sending your provider a message by [facility] may be a faster and more efficient way to get a response.  Please allow 48 business hours for a response.  Please remember that this is for non-urgent requests.  _______________________________________________________ Thank you for coming in today.  Yancey Flemings, MD

## 2023-01-10 NOTE — Progress Notes (Signed)
HISTORY OF PRESENT ILLNESS:  Dan [name] is a 72 y.o. male with multiple medical problems as listed below.  He also has a history of atrial fibrillation for which she is on chronic anticoagulation therapy.  Have seen the patient in the past for history of multiple adenomatous colon polyps.  Last colonoscopy 5 years ago.  Multiple small adenomas at that time.  Follow-up in 5 years recommended.  He also underwent screening upper endoscopy at that time for history of cirrhosis.  No varices.   He presents today for follow-up after he contacted the office with complaints of acute and persistent diarrhea.  On my colleagues, Dan [name], recommended blood work which was unremarkable.  However, stool studies revealed C. difficile.  He was treated with vancomycin 125 mg p.o. 4 times daily which she started yesterday.  He has been using Imodium.  Diarrhea has slowed.  No abdominal pain or fevers.  His risk factor for this was several doses of Keflex that he took around dental cleaning.  He was recommended to do this due to prior knee replacement history.  He inquires about surveillance colonoscopy  REVIEW OF SYSTEMS:  All non-GI ROS negative unless otherwise stated in HPI.  Past Medical History:  Diagnosis Date   Arthritis    osteoarthritis left knee   Atrial fibrillation    BPH (benign prostatic hypertrophy)    Cardiac arrhythmia due to congenital heart disease    Cholelithiasis    Chronic atrial fibrillation    Colon polyps    adenomatous   Diabetes mellitus    Diverticulosis    Dysrhythmia    tx. Pradaxa- Dan [name] follows   Elevated LFTs    GERD (gastroesophageal reflux disease)    Hemorrhoids    Hyperlipidemia    Hypertension    Morbid obesity    Sleep apnea    CPAP nightly   Tubular adenoma of colon     Past Surgical History:  Procedure Laterality Date   KNEE ARTHROSCOPY Left 03/28/2014   Procedure: LEFT KNEE ARTHROSCOPY WITH DEBRIDEMENT  ;  Surgeon: Dan [name];   Location: [facility];  Service: Orthopedics;  Laterality: Left;   TONSILLECTOMY     TOTAL KNEE ARTHROPLASTY Left 04/30/2015   Procedure: LEFT TOTAL KNEE ARTHROPLASTY;  Surgeon: Dan [name];  Location[facility];  Service: Orthopedics;  Laterality: Left;   US ECHOCARDIOGRAPHY  08/08/2006   ef 55-60%    Social History Dan [name]  reports that he quit smoking about 31 years ago. His smoking use included cigarettes. He has quit using smokeless tobacco. He reports current alcohol use. He reports that he does not use drugs.  family history includes Breast cancer in his sister; Colon cancer in his mother; Heart attack in his father; Heart disease in his father.  Allergies  Allergen Reactions   Macadamia Nut Oil Shortness Of Breath   Amoxicillin-Pot Clavulanate Other (See Comments)       PHYSICAL EXAMINATION: Vital signs: BP 132/80   Pulse 93   Ht 6\' 1"  (1.854 m)   Wt 282 lb (127.9 kg)   BMI 37.21 kg/m   Constitutional: generally well-appearing, no acute distress Psychiatric: alert and oriented x3, cooperative Eyes: extraocular movements intact, anicteric, conjunctiva pink Mouth: oral pharynx moist, no lesions Neck: supple no lymphadenopathy Cardiovascular: heart regular rate and rhythm, no murmur Lungs: clear to auscultation bilaterally Abdomen: soft, nontender, nondistended, no obvious ascites, no peritoneal signs, normal bowel sounds, no organomegaly Rectal: Omitted  Extremities: no clubbing, cyanosis, or lower extremity edema bilaterally Skin: no lesions on visible extremities Neuro: No focal deficits. No asterixis.    ASSESSMENT:  1.  C. difficile related diarrhea.  Risk factors recent Keflex use 2.  History of multiple adenomatous colon polyps.  Due for surveillance 3.  History of cirrhosis.  Previous endoscopy negative for varices 4.  Multiple medical problems including atrial fibrillation on chronic anticoagulation   PLAN:  1.  Continue  vancomycin 125 mg p.o. 4 times daily x 10 days total 2.  I advised him that there is a 25% risk of recurrence or relapse.  He is to contact the office should he develop recurrent diarrhea. 3.  Terms of surveillance colonoscopy, he is going to make an appointment a few months down the road to discuss and arrange. 4.  Consider repeat upper endoscopy at that time for varices screening 5.  Ongoing general medical care with Dr. Jacky [name] A total time of 45 minutes was spent preparing to see the patient, obtaining interval history, performing medically appropriate physical exam, counseling and educating the patient regarding the above listed issues, reviewed ordered medication and blood work as well as stool study, and documenting clinical information in the health record.

## 2023-01-11 LAB — PANCREATIC ELASTASE, FECAL: Pancreatic Elastase-1, Stool: 302 mcg/g

## 2023-01-11 LAB — CALPROTECTIN, FECAL: Calprotectin, Fecal: <5 ug/g (ref 0–120)

## 2023-01-26 DIAGNOSIS — E668 Other obesity: Secondary | ICD-10-CM | POA: Diagnosis not present

## 2023-01-26 DIAGNOSIS — G4733 Obstructive sleep apnea (adult) (pediatric): Secondary | ICD-10-CM | POA: Diagnosis not present

## 2023-01-26 DIAGNOSIS — I10 Essential (primary) hypertension: Secondary | ICD-10-CM | POA: Diagnosis not present

## 2023-02-22 DIAGNOSIS — R3912 Poor urinary stream: Secondary | ICD-10-CM | POA: Diagnosis not present

## 2023-02-22 DIAGNOSIS — N5201 Erectile dysfunction due to arterial insufficiency: Secondary | ICD-10-CM | POA: Diagnosis not present

## 2023-02-22 DIAGNOSIS — R3914 Feeling of incomplete bladder emptying: Secondary | ICD-10-CM | POA: Diagnosis not present

## 2023-02-22 DIAGNOSIS — N401 Enlarged prostate with lower urinary tract symptoms: Secondary | ICD-10-CM | POA: Diagnosis not present

## 2023-03-03 NOTE — Progress Notes (Unsigned)
Dan [name] Date of Birth: 1951/09/25   History of Present Illness Dan [name] is seen today for follow up of atrial fibrillation. He has a history of chronic atrial fibrillation that has been managed with rate control and anticoagulation. Stress Myoview in August 2014 which was low risk with a small fixed apical defect. No ischemia. He also has a history of HTN, DM, obesity, and HLD. 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 There was incidental findings of gallstones. The liver was nodular in appearance c/w cirrhosis. Subsequent CT showed evidence of cirrhosis. He did have normal EGD in February 2019 without varices and colonoscopy showed some polyps and tics.   On follow up today he is doing well. Activity has been limited by knee pain.  Is still unaware of his Afib. No chest pain, dyspnea, palpitations.He notes BP at home is around 135 systolic. He is still working but not traveling as much.   Current Outpatient Medications on File Prior to Visit  Medication Sig Dispense Refill   amLODipine (NORVASC) 10 MG tablet Take 10 mg by mouth every morning.     apixaban (ELIQUIS) 5 MG TABS tablet TAKE 1 TABLET BY MOUTH TWICE DAILY 180 tablet 1   ezetimibe (ZETIA) 10 MG tablet Take 10 mg by mouth daily.     fluticasone (FLONASE) 50 MCG/ACT nasal spray Place into the nose.     furosemide (LASIX) 20 MG tablet Take 20 mg by mouth daily.     hydrALAZINE (APRESOLINE) 50 MG tablet TAKE 1 TABLET(50 MG) BY MOUTH TWICE DAILY 180 tablet 2   lisinopril (ZESTRIL) 40 MG tablet Take 1 tablet (40 mg total) by mouth daily. 90 tablet 4   losartan (COZAAR) 100 MG tablet TAKE 1 TABLET(100 MG) BY MOUTH DAILY 90 tablet 3   metFORMIN (GLUCOPHAGE) 1000 MG tablet Take 1 tablet by mouth 2 (two) times daily with a meal.      metoprolol succinate (TOPROL-XL) 50 MG 24 hr tablet Take 1 tablet by mouth daily.     Nebivolol HCl (BYSTOLIC)  20 MG TABS Take 20 mg by mouth in the morning and at bedtime.     omeprazole (PRILOSEC) 40 MG capsule TAKE 1 CAPSULE(40 MG) BY MOUTH DAILY 30 capsule 3   OZEMPIC, 1 MG/DOSE, 4 MG/3ML SOPN Taking for 90 days only to see if works     sildenafil (VIAGRA) 100 MG tablet      simvastatin (ZOCOR) 20 MG tablet Take 20 mg by mouth daily.     spironolactone (ALDACTONE) 25 MG tablet Take 1 tablet (25 mg total) by mouth daily. 90 tablet 1   No current facility-administered medications on file prior to visit.    Allergies  Allergen Reactions   Macadamia Nut Oil Shortness Of Breath   Amoxicillin-Pot Clavulanate Other (See Comments)    Past Medical History:  Diagnosis Date   Arthritis    osteoarthritis left knee   Atrial fibrillation (HCC)    BPH (benign prostatic hypertrophy)    Cardiac arrhythmia due to congenital heart disease    Cholelithiasis    Chronic atrial fibrillation (HCC)    Colon polyps    adenomatous   Diabetes mellitus    Diverticulosis    Dysrhythmia    tx. Pradaxa- Dr. Swaziland follows   Elevated LFTs    GERD (gastroesophageal reflux disease)    Hemorrhoids    Hyperlipidemia  Hypertension    Morbid obesity (HCC)    Sleep apnea    CPAP nightly   Tubular adenoma of colon     Past Surgical History:  Procedure Laterality Date   KNEE ARTHROSCOPY Left 03/28/2014   Procedure: LEFT KNEE ARTHROSCOPY WITH DEBRIDEMENT  ;  Surgeon: Javier Docker, MD;  Location: [facility];  Service: Orthopedics;  Laterality: Left;   TONSILLECTOMY     TOTAL KNEE ARTHROPLASTY Left 04/30/2015   Procedure: LEFT TOTAL KNEE ARTHROPLASTY;  Surgeon: Jene Every, MD;  Location: [facility];  Service: Orthopedics;  Laterality: Left;   US ECHOCARDIOGRAPHY  08/08/2006   ef 55-60%    Social History   Tobacco Use  Smoking Status Former   Types: Cigarettes   Quit date: 06/20/1991   Years since quitting: 31.7  Smokeless Tobacco Former    Social History   Substance and Sexual Activity   Alcohol Use Yes   Alcohol/week: 0.0 standard drinks of alcohol   Comment: social    Family History  Problem Relation Age of Onset   Colon cancer Mother    Heart attack Father    Heart disease Father    Breast cancer Sister    Stomach cancer Neg Hx    Esophageal cancer Neg Hx     Review of Systems: As per history of present illness.  All other systems were reviewed and are negative.  Physical Exam: BP 134/78 (BP Location: Left Arm, Patient Position: Sitting)   Pulse 88   Ht 6\' 1"  (1.854 m)   Wt 285 lb 6.4 oz (129.5 kg)   SpO2 97%   BMI 37.65 kg/m   GENERAL:  Well appearing, obese WM in NAD HEENT:  PERRL, EOMI, sclera are clear. Oropharynx is clear. NECK:  No jugular venous distention, carotid upstroke brisk and symmetric, no bruits, no thyromegaly or adenopathy LUNGS:  Clear to auscultation bilaterally CHEST:  Unremarkable HEART:  IRRR,  PMI not displaced or sustained,S1 and S2 within normal limits, no S3, no S4: no clicks, no rubs, no murmurs ABD:  Soft, nontender. BS +, no masses or bruits. No hepatomegaly, no splenomegaly EXT:  2 + pulses throughout, no edema, skin around ankles is scaly, no cyanosis no clubbing SKIN:  Warm and dry.  Hyperpigmentation LE. Excellent pulses. NEURO:  Alert and oriented x 3. Cranial nerves II through XII intact. PSYCH:  Cognitively intact    LABORATORY DATA: Lab Results  Component Value Date   WBC 7.2 12/27/2022   HGB 15.2 12/27/2022   HCT 44.9 12/27/2022   PLT 195.0 12/27/2022   GLUCOSE 217 (H) 12/27/2022   ALT 28 12/27/2022   AST 21 12/27/2022   NA 137 12/27/2022   K 5.3 (H) 12/27/2022   CL 100 12/27/2022   CREATININE 0.91 12/27/2022   BUN 17 12/27/2022   CO2 30 12/27/2022   TSH 3.15 12/27/2022   INR 1.3 (H) 12/02/2016   Labs dated 06/10/16: cholesterol 112, triglycerides 122, HDL 37, LDL 51.  Dated 2.5.18: A1c 7.4%.  Dated 06/15/17: cholesterol 104, triglycerides 127, HDL 38, LDL 41, CBC, CMET, TSH normal Dated 03/15/18:  A1c 5.9%.  Dated 07/27/18: cholesterol 99, triglycerides 68, HDL 46, LDL 39. CMET, CBC, TSH normal Dated 04/01/19 A1c 6.8%.  Dated 07/31/19: cholesterol 126, triglycerides 169, HDL 43, LDL 49. A1c 7.3%. CBC, CMET and TSH normal Dated 08/06/20: A1c 6.6%. cholesterol 114, triglycerides 150, HDL 44, LDL 40. CMET and TSH normal Dated 08/25/21: cholesterol 108, triglycerides 109, HDL 44, LDL  42. A1c 7.1%. CMET normal Dated 08/31/22: cholesterol 110, triglycerides 149, HDL 40, LDL 40. A1c 7.9%. CMET normal.   ECG today demonstrates atrial fibrillation with a controlled ventricular response of 88 beats per minute. It is otherwise normal. I have personally reviewed and interpreted this study.  Carotid dopplers 01/04/22: Summary:  Right Carotid: Velocities in the right ICA are consistent with a 1-39%  stenosis.                Non-hemodynamically significant plaque <50% noted in the  CCA.   Left Carotid: Velocities in the left ICA are consistent with a 1-39%  stenosis.   Vertebrals: Bilateral vertebral arteries demonstrate antegrade flow.  Subclavians: Normal flow hemodynamics were seen in bilateral subclavian               arteries.    Assessment / Plan:d 1. Permanent atrial fibrillation. Rate is a little elevated today but has been well controlled.  He is anticoagulated with Eliquis.  He is asymptomatic. We will continue with his current therapy.   2. Low risk Myoview in 2014 without ischemia.  3. Hypertension- resistant. Now well controlled on multiple meds.   4. Diabetes mellitus type 2-A1c 7.9%. per PCP. Focusing on weight loss.   5. Hyperlipidemia on Vytorin. Excellent control  6. Hepatic cirrhosis. Probably related to fatty liver.     Follow up in one year

## 2023-03-06 ENCOUNTER — Encounter: Payer: Self-pay | Admitting: Cardiology

## 2023-03-06 ENCOUNTER — Ambulatory Visit: Payer: PPO | Attending: Cardiology | Admitting: Cardiology

## 2023-03-06 VITALS — BP 134/78 | HR 88 | Ht 73.0 in | Wt 285.4 lb

## 2023-03-06 DIAGNOSIS — I482 Chronic atrial fibrillation, unspecified: Secondary | ICD-10-CM | POA: Diagnosis not present

## 2023-03-06 DIAGNOSIS — E785 Hyperlipidemia, unspecified: Secondary | ICD-10-CM

## 2023-03-06 DIAGNOSIS — I10 Essential (primary) hypertension: Secondary | ICD-10-CM | POA: Diagnosis not present

## 2023-03-06 NOTE — Patient Instructions (Signed)
Medication Instructions:  Continue same medications *If you need a refill on your cardiac medications before your next appointment, please call your pharmacy*   Lab Work: None  ordered  Testing/Procedures: None ordered   Follow-Up: At [facility], you and your health needs are our priority.  As part of our continuing mission to provide you with exceptional heart care, we have created designated Provider Care Teams.  These Care Teams include your primary Cardiologist (physician) and Advanced Practice Providers (APPs -  Physician Assistants and Nurse Practitioners) who all work together to provide you with the care you need, when you need it.  We recommend signing up for the patient portal called "MyChart".  Sign up information is provided on this After Visit Summary.  MyChart is used to connect with patients for Virtual Visits (Telemedicine).  Patients are able to view lab/test results, encounter notes, upcoming appointments, etc.  Non-urgent messages can be sent to your provider as well.   To learn more about what you can do with MyChart, go to [email or web address].    Your next appointment:  1 year    Call in March to schedule June appointment     Provider:  Dr.Jordan   

## 2023-03-07 ENCOUNTER — Other Ambulatory Visit: Payer: Self-pay | Admitting: Cardiology

## 2023-03-07 DIAGNOSIS — I482 Chronic atrial fibrillation, unspecified: Secondary | ICD-10-CM

## 2023-03-07 NOTE — Telephone Encounter (Signed)
Eliquis 5mg  refill request received. Patient is 72 years old, weight-129.5kg, Crea-0.91 on 12/27/22, Diagnosis-Afib, and last seen by Dr. Swaziland on 03/06/23. Dose is appropriate based on dosing criteria. Will send in refill to requested pharmacy.

## 2023-03-08 DIAGNOSIS — E1169 Type 2 diabetes mellitus with other specified complication: Secondary | ICD-10-CM | POA: Diagnosis not present

## 2023-03-08 DIAGNOSIS — M1990 Unspecified osteoarthritis, unspecified site: Secondary | ICD-10-CM | POA: Diagnosis not present

## 2023-03-08 DIAGNOSIS — I10 Essential (primary) hypertension: Secondary | ICD-10-CM | POA: Diagnosis not present

## 2023-03-08 DIAGNOSIS — Z6838 Body mass index (BMI) 38.0-38.9, adult: Secondary | ICD-10-CM | POA: Diagnosis not present

## 2023-04-04 DIAGNOSIS — G4733 Obstructive sleep apnea (adult) (pediatric): Secondary | ICD-10-CM | POA: Diagnosis not present

## 2023-05-04 DIAGNOSIS — M79604 Pain in right leg: Secondary | ICD-10-CM | POA: Diagnosis not present

## 2023-05-04 DIAGNOSIS — M48062 Spinal stenosis, lumbar region with neurogenic claudication: Secondary | ICD-10-CM | POA: Diagnosis not present

## 2023-05-04 DIAGNOSIS — M79605 Pain in left leg: Secondary | ICD-10-CM | POA: Diagnosis not present

## 2023-05-18 DIAGNOSIS — M545 Low back pain, unspecified: Secondary | ICD-10-CM | POA: Diagnosis not present

## 2023-05-22 DIAGNOSIS — M545 Low back pain, unspecified: Secondary | ICD-10-CM | POA: Diagnosis not present

## 2023-05-22 DIAGNOSIS — M48061 Spinal stenosis, lumbar region without neurogenic claudication: Secondary | ICD-10-CM | POA: Diagnosis not present

## 2023-05-22 DIAGNOSIS — I4891 Unspecified atrial fibrillation: Secondary | ICD-10-CM | POA: Diagnosis not present

## 2023-05-22 DIAGNOSIS — M5136 Other intervertebral disc degeneration, lumbar region: Secondary | ICD-10-CM | POA: Diagnosis not present

## 2023-05-25 DIAGNOSIS — M5416 Radiculopathy, lumbar region: Secondary | ICD-10-CM | POA: Diagnosis not present

## 2023-06-19 DIAGNOSIS — M48061 Spinal stenosis, lumbar region without neurogenic claudication: Secondary | ICD-10-CM | POA: Diagnosis not present

## 2023-06-19 DIAGNOSIS — I4891 Unspecified atrial fibrillation: Secondary | ICD-10-CM | POA: Diagnosis not present

## 2023-06-19 DIAGNOSIS — M545 Low back pain, unspecified: Secondary | ICD-10-CM | POA: Diagnosis not present

## 2023-06-19 DIAGNOSIS — M5136 Other intervertebral disc degeneration, lumbar region: Secondary | ICD-10-CM | POA: Diagnosis not present

## 2023-07-06 DIAGNOSIS — I10 Essential (primary) hypertension: Secondary | ICD-10-CM | POA: Diagnosis not present

## 2023-07-06 DIAGNOSIS — G4733 Obstructive sleep apnea (adult) (pediatric): Secondary | ICD-10-CM | POA: Diagnosis not present

## 2023-07-10 DIAGNOSIS — L309 Dermatitis, unspecified: Secondary | ICD-10-CM | POA: Diagnosis not present

## 2023-07-10 DIAGNOSIS — E13621 Other specified diabetes mellitus with foot ulcer: Secondary | ICD-10-CM | POA: Diagnosis not present

## 2023-07-13 ENCOUNTER — Ambulatory Visit: Payer: PPO | Admitting: Podiatry

## 2023-07-13 ENCOUNTER — Encounter: Payer: Self-pay | Admitting: Podiatry

## 2023-07-13 DIAGNOSIS — E1140 Type 2 diabetes mellitus with diabetic neuropathy, unspecified: Secondary | ICD-10-CM | POA: Diagnosis not present

## 2023-07-13 DIAGNOSIS — L97421 Non-pressure chronic ulcer of left heel and midfoot limited to breakdown of skin: Secondary | ICD-10-CM

## 2023-07-13 DIAGNOSIS — M21612 Bunion of left foot: Secondary | ICD-10-CM

## 2023-07-14 NOTE — Progress Notes (Signed)
Subjective:   Patient ID: Dan [name], male   DOB: 72 y.o.   MRN: [id number]   HPI Patient presents stating he has developed a foot ulcer on the bottom of his left big toe and he does have significant neuropathic condition and does not walk normally with moderate obesity   ROS      Objective:  Physical Exam  Vascular status was intact neurologically diminishment of sharp dull vibratory.  Patient is found to have significant structural deformity does have bunion deformity bilateral with deviation of the hallux and has keratotic lesion subfirst metatarsal head left with superficial breakdown of approximate 7 x 7 mm no bone tendon or subcutaneous exposure     Assessment:  Combination structural deformity with bunion deformity and also chronic lesion formation with superficial ulceration     Plan:  H&P reviewed both conditions.  For bunion it may need to be corrected and may require met head resection and I explained all this to him but at this point organ to focus on the superficial area and using sharp sterile instrumentation I debrided it I did not note any active drainage currently I flushed it applied Iodosorb sterile dressing and padding and instructed him on wet-to-dry dressings and padding.  Patient will be seen back as symptoms indicate

## 2023-07-20 DIAGNOSIS — M5416 Radiculopathy, lumbar region: Secondary | ICD-10-CM | POA: Diagnosis not present

## 2023-07-21 ENCOUNTER — Telehealth: Payer: Self-pay

## 2023-07-21 NOTE — Telephone Encounter (Signed)
Patient called and would like to know what he should be putting on his callous.

## 2023-07-24 ENCOUNTER — Ambulatory Visit: Payer: PPO | Admitting: Podiatry

## 2023-07-24 ENCOUNTER — Encounter: Payer: Self-pay | Admitting: Podiatry

## 2023-07-24 DIAGNOSIS — L97421 Non-pressure chronic ulcer of left heel and midfoot limited to breakdown of skin: Secondary | ICD-10-CM | POA: Diagnosis not present

## 2023-07-24 DIAGNOSIS — E1140 Type 2 diabetes mellitus with diabetic neuropathy, unspecified: Secondary | ICD-10-CM | POA: Diagnosis not present

## 2023-07-24 DIAGNOSIS — M21612 Bunion of left foot: Secondary | ICD-10-CM

## 2023-07-24 DIAGNOSIS — E1149 Type 2 diabetes mellitus with other diabetic neurological complication: Secondary | ICD-10-CM | POA: Diagnosis not present

## 2023-07-24 MED ORDER — DOXYCYCLINE HYCLATE 100 MG PO TABS
100.0000 mg | ORAL_TABLET | Freq: Two times a day (BID) | ORAL | 1 refills | Status: DC
Start: 2023-07-24 — End: 2023-09-05

## 2023-07-24 NOTE — Telephone Encounter (Signed)
A pad around it to reduce pressure and if still draining come in

## 2023-07-25 ENCOUNTER — Telehealth: Payer: Self-pay

## 2023-07-25 LAB — CBC WITH DIFFERENTIAL/PLATELET
Absolute Lymphocytes: 1966 {cells}/uL (ref 850–3900)
Absolute Monocytes: 1091 {cells}/uL — ABNORMAL HIGH (ref 200–950)
Basophils Absolute: 43 {cells}/uL (ref 0–200)
Basophils Relative: 0.4 %
Eosinophils Absolute: 184 {cells}/uL (ref 15–500)
Eosinophils Relative: 1.7 %
HCT: 52.6 % — ABNORMAL HIGH (ref 38.5–50.0)
Hemoglobin: 17.2 g/dL — ABNORMAL HIGH (ref 13.2–17.1)
MCH: 29.6 pg (ref 27.0–33.0)
MCHC: 32.7 g/dL (ref 32.0–36.0)
MCV: 90.5 fL (ref 80.0–100.0)
MPV: 11.6 fL (ref 7.5–12.5)
Monocytes Relative: 10.1 %
Neutro Abs: 7517 {cells}/uL (ref 1500–7800)
Neutrophils Relative %: 69.6 %
Platelets: 223 10*3/uL (ref 140–400)
RBC: 5.81 10*6/uL — ABNORMAL HIGH (ref 4.20–5.80)
RDW: 12.2 % (ref 11.0–15.0)
Total Lymphocyte: 18.2 %
WBC: 10.8 10*3/uL (ref 3.8–10.8)

## 2023-07-25 LAB — COMPLETE METABOLIC PANEL WITH GFR
AG Ratio: 1.3 (calc) (ref 1.0–2.5)
ALT: 16 U/L (ref 9–46)
AST: 10 U/L (ref 10–35)
Albumin: 4.5 g/dL (ref 3.6–5.1)
Alkaline phosphatase (APISO): 68 U/L (ref 35–144)
BUN/Creatinine Ratio: 32 (calc) — ABNORMAL HIGH (ref 6–22)
BUN: 38 mg/dL — ABNORMAL HIGH (ref 7–25)
CO2: 28 mmol/L (ref 20–32)
Calcium: 12.2 mg/dL — ABNORMAL HIGH (ref 8.6–10.3)
Chloride: 95 mmol/L — ABNORMAL LOW (ref 98–110)
Creat: 1.19 mg/dL (ref 0.70–1.28)
Globulin: 3.6 g/dL (ref 1.9–3.7)
Glucose, Bld: 293 mg/dL — ABNORMAL HIGH (ref 65–99)
Potassium: 5 mmol/L (ref 3.5–5.3)
Sodium: 136 mmol/L (ref 135–146)
Total Bilirubin: 1.6 mg/dL — ABNORMAL HIGH (ref 0.2–1.2)
Total Protein: 8.1 g/dL (ref 6.1–8.1)
eGFR: 64 mL/min/{1.73_m2} (ref >=60)

## 2023-07-25 NOTE — Telephone Encounter (Signed)
Patient called and asked if we could fax his lab results from yesterday over to Dr. Geoffry Paradise. Spoke to patient and advised that I have faxed the CBC and CMP results as requested. The arthritis panel has not resulted yet, but I assured him I would send it over as soon as I got it. Patient was very Adult nurse.  Dr. Geoffry Paradise  [facility] Phone [phone] Fax [phone]

## 2023-07-26 NOTE — Progress Notes (Signed)
Subjective:   Patient ID: Dan [name], male   DOB: 72 y.o.   MRN: [id number]   HPI Patient presents with wife stating that he still has some tenderness underneath his foot   ROS      Objective:  Physical Exam  Neurovascular status intact with patient's plantar left first metatarsal showing a very slight crusted like tissue formation no drainage no erythema edema surrounding the area measures about 5 x 5 mm no subcutaneous or any other kind of probe beyond superficial skin     Assessment:  Superficial ulceration left with caregiver stating he seems to have just been tired recently     Plan:  Reviewed condition with him and wife and just as precautionary measure and placement on antibiotic doxycycline I instructed on wound care and offloading this area as he will be on his foot a lot the next week.  Patient was given strict instructions of any changes were to occur to let us know immediately and I did send him for blood work to rule out any possible systemic pathology.

## 2023-07-26 NOTE — Telephone Encounter (Signed)
Thanks for sending, he really needs to see his primary doctor

## 2023-07-27 DIAGNOSIS — L97521 Non-pressure chronic ulcer of other part of left foot limited to breakdown of skin: Secondary | ICD-10-CM | POA: Diagnosis not present

## 2023-07-27 DIAGNOSIS — E11621 Type 2 diabetes mellitus with foot ulcer: Secondary | ICD-10-CM | POA: Diagnosis not present

## 2023-07-27 DIAGNOSIS — I10 Essential (primary) hypertension: Secondary | ICD-10-CM | POA: Diagnosis not present

## 2023-07-27 DIAGNOSIS — E1142 Type 2 diabetes mellitus with diabetic polyneuropathy: Secondary | ICD-10-CM | POA: Diagnosis not present

## 2023-07-31 ENCOUNTER — Other Ambulatory Visit (INDEPENDENT_AMBULATORY_CARE_PROVIDER_SITE_OTHER): Payer: PPO

## 2023-07-31 ENCOUNTER — Ambulatory Visit: Payer: PPO | Admitting: Orthopedic Surgery

## 2023-07-31 DIAGNOSIS — M79672 Pain in left foot: Secondary | ICD-10-CM | POA: Diagnosis not present

## 2023-07-31 DIAGNOSIS — I480 Paroxysmal atrial fibrillation: Secondary | ICD-10-CM | POA: Diagnosis not present

## 2023-07-31 DIAGNOSIS — E11621 Type 2 diabetes mellitus with foot ulcer: Secondary | ICD-10-CM | POA: Diagnosis not present

## 2023-07-31 DIAGNOSIS — I10 Essential (primary) hypertension: Secondary | ICD-10-CM | POA: Diagnosis not present

## 2023-07-31 DIAGNOSIS — E1142 Type 2 diabetes mellitus with diabetic polyneuropathy: Secondary | ICD-10-CM | POA: Diagnosis not present

## 2023-07-31 DIAGNOSIS — E785 Hyperlipidemia, unspecified: Secondary | ICD-10-CM | POA: Diagnosis not present

## 2023-07-31 DIAGNOSIS — L97521 Non-pressure chronic ulcer of other part of left foot limited to breakdown of skin: Secondary | ICD-10-CM | POA: Diagnosis not present

## 2023-07-31 DIAGNOSIS — Z7901 Long term (current) use of anticoagulants: Secondary | ICD-10-CM | POA: Diagnosis not present

## 2023-07-31 DIAGNOSIS — E669 Obesity, unspecified: Secondary | ICD-10-CM | POA: Diagnosis not present

## 2023-07-31 NOTE — Progress Notes (Unsigned)
Office Visit Note   Patient: Dan [name]           Date of Birth: 08/23/1951           MRN: [id number] Visit Date: 07/31/2023              Requested by: Geoffry Paradise, MD [address] [place],  [place] [address] PCP: Geoffry Paradise, MD  Chief Complaint  Patient presents with  . Left Foot - Open Wound      HPI: ***  Assessment & Plan: Visit Diagnoses:  1. Pain in left foot     Plan: ***  Follow-Up Instructions: No follow-ups on file.   Ortho Exam  Patient is alert, oriented, no adenopathy, well-dressed, normal affect, normal respiratory effort. ***  Imaging: No results found. No images are attached to the encounter.  Labs: Lab Results  Component Value Date   CRP <1.0 12/27/2022   REPTSTATUS 11/08/2014 FINAL 11/02/2014   CULT  11/02/2014    NO GROWTH 5 DAYS Performed at [facility]      Lab Results  Component Value Date   ALBUMIN 4.3 12/27/2022   ALBUMIN 4.5 12/02/2016   ALBUMIN 3.4 (L) 11/03/2014    No results found for: "MG" No results found for: "VD25OH"  No results found for: "PREALBUMIN"    Latest Ref Rng & Units 07/24/2023    4:36 PM 12/27/2022    7:29 AM 12/02/2016   10:21 AM  CBC EXTENDED  WBC 3.8 - 10.8 Thousand/uL 10.8  7.2  8.0   RBC 4.20 - 5.80 Million/uL 5.81  4.97  5.27   Hemoglobin 13.2 - 17.1 g/dL 19.1  47.8  29.5   HCT 38.5 - 50.0 % 52.6  44.9  45.5   Platelets 140 - 400 Thousand/uL 223  195.0  224.0   NEUT# 1,500 - 7,800 cells/uL 7,517  4.3  6.0   Lymph# 0.7 - 4.0 K/uL  1.7  1.0      There is no height or weight on file to calculate BMI.  Orders:  Orders Placed This Encounter  Procedures  . XR Foot Complete Left   No orders of the defined types were placed in this encounter.    Procedures: No procedures performed  Clinical Data: No additional findings.  ROS:  All other systems negative, except as noted in the HPI. Review of Systems  Objective: Vital Signs: There were no vitals taken for  this visit.  Specialty Comments:  No specialty comments available.  PMFS History: Patient Active Problem List   Diagnosis Date Noted  . Tinnitus, bilateral 08/08/2018  . Lumbar pain 02/23/2018  . Degeneration of lumbar intervertebral disc 02/23/2018  . Increased body mass index 02/23/2018  . History of total knee arthroplasty 10/24/2017  . Osteoarthritis 10/24/2017  . Acute recurrent pansinusitis 09/13/2016  . Chronic rhinitis 09/13/2016  . Obstructive sleep apnea 09/13/2016  . Primary osteoarthritis of left knee 04/30/2015  . Left knee DJD 04/30/2015  . Pneumonia 11/02/2014  . Hypertension   . Diabetes mellitus (HCC)   . Hyperlipidemia   . OTHER AND UNSPECIFIED COAGULATION DEFECTS 09/28/2010  . Chronic a-fib (HCC) 09/28/2010  . History of colonic polyps 09/28/2010   Past Medical History:  Diagnosis Date  . Arthritis    osteoarthritis left knee  . Atrial fibrillation (HCC)   . BPH (benign prostatic hypertrophy)   . Cardiac arrhythmia due to congenital heart disease   . Cholelithiasis   . Chronic atrial fibrillation (HCC)   .  Colon polyps    adenomatous  . Diabetes mellitus   . Diverticulosis   . Dysrhythmia    tx. Pradaxa- Dr. Swaziland follows  . Elevated LFTs   . GERD (gastroesophageal reflux disease)   . Hemorrhoids   . Hyperlipidemia   . Hypertension   . Morbid obesity (HCC)   . Sleep apnea    CPAP nightly  . Tubular adenoma of colon     Family History  Problem Relation Age of Onset  . Colon cancer Mother   . Heart attack Father   . Heart disease Father   . Breast cancer Sister   . Stomach cancer Neg Hx   . Esophageal cancer Neg Hx     Past Surgical History:  Procedure Laterality Date  . KNEE ARTHROSCOPY Left 03/28/2014   Procedure: LEFT KNEE ARTHROSCOPY WITH DEBRIDEMENT  ;  Surgeon: Javier Docker, MD;  Location: [facility];  Service: Orthopedics;  Laterality: Left;  . TONSILLECTOMY    . TOTAL KNEE ARTHROPLASTY Left 04/30/2015    Procedure: LEFT TOTAL KNEE ARTHROPLASTY;  Surgeon: Jene Every, MD;  Location: [facility];  Service: Orthopedics;  Laterality: Left;  . US ECHOCARDIOGRAPHY  08/08/2006   ef 55-60%   Social History   Occupational History  . Occupation: VP Insurance account manager: [employer]  . Occupation: Airline pilot  Tobacco Use  . Smoking status: Former    Current packs/day: 0.00    Types: Cigarettes    Quit date: 06/20/1991    Years since quitting: 32.1  . Smokeless tobacco: Former  Advertising account planner  . Vaping status: Never Used  Substance and Sexual Activity  . Alcohol use: Yes    Alcohol/week: 0.0 standard drinks of alcohol    Comment: social  . Drug use: No  . Sexual activity: Yes

## 2023-08-01 ENCOUNTER — Encounter: Payer: Self-pay | Admitting: Orthopedic Surgery

## 2023-08-01 ENCOUNTER — Telehealth: Payer: Self-pay | Admitting: Orthopedic Surgery

## 2023-08-01 LAB — URIC ACID: Uric Acid, Serum: 6.5 mg/dL (ref 4.0–8.0)

## 2023-08-01 MED ORDER — COLCHICINE 0.6 MG PO CAPS: 0.6000 mg | ORAL_CAPSULE | Freq: Every day | ORAL | 1 refills | Status: DC

## 2023-08-01 NOTE — Telephone Encounter (Signed)
Dan Mustard, MD  [name] Call patient.  His uric acid is slightly elevated at 6.5.  A prescription sent in for colchicine 0.6 mg daily.  Reevaluate in 1 week.

## 2023-08-01 NOTE — Telephone Encounter (Signed)
Pt informed. He will come in on 08/07/23 at 2pm.

## 2023-08-03 DIAGNOSIS — E785 Hyperlipidemia, unspecified: Secondary | ICD-10-CM | POA: Diagnosis not present

## 2023-08-03 DIAGNOSIS — E1169 Type 2 diabetes mellitus with other specified complication: Secondary | ICD-10-CM | POA: Diagnosis not present

## 2023-08-03 DIAGNOSIS — I10 Essential (primary) hypertension: Secondary | ICD-10-CM | POA: Diagnosis not present

## 2023-08-03 DIAGNOSIS — L97521 Non-pressure chronic ulcer of other part of left foot limited to breakdown of skin: Secondary | ICD-10-CM | POA: Diagnosis not present

## 2023-08-07 ENCOUNTER — Ambulatory Visit (INDEPENDENT_AMBULATORY_CARE_PROVIDER_SITE_OTHER): Payer: PPO | Admitting: Orthopedic Surgery

## 2023-08-07 DIAGNOSIS — M79672 Pain in left foot: Secondary | ICD-10-CM

## 2023-08-07 DIAGNOSIS — L97521 Non-pressure chronic ulcer of other part of left foot limited to breakdown of skin: Secondary | ICD-10-CM

## 2023-08-08 ENCOUNTER — Encounter: Payer: Self-pay | Admitting: Orthopedic Surgery

## 2023-08-08 NOTE — Progress Notes (Signed)
Office Visit Note   Patient: Dan [name]           Date of Birth: [birth year]           MRN: [id number] Visit Date: 08/07/2023              Requested by: Geoffry Paradise, MD [address] [place],  [place] [address] PCP: Geoffry Paradise, MD  Chief Complaint  Patient presents with   Left Foot - Follow-up      HPI: Patient is a 72 year old gentleman is seen in follow-up for [facility] grade 1 ulcer left great toe.  His uric acid was 6.5 and he was started on colchicine 0.6 mg daily.  Patient had difficulty getting the prescription but he states he is taken 5 days worth and does feel better.  Assessment & Plan: Visit Diagnoses:  1. Pain in left foot   2. Skin ulcer of left great toe, limited to breakdown of skin (HCC)     Plan: We will add a additional felt padding to his postoperative shoe.  Follow-Up Instructions: Return in about 2 weeks (around 08/21/2023).   Ortho Exam  Patient is alert, oriented, no adenopathy, well-dressed, normal affect, normal respiratory effort. Examination the redness and swelling of the left great toe has decreased.  Patient is not on antibiotics.  Patient has completed his doxycycline.  Examination the ulcer has flat healthy granulation tissue with decreased redness.  The ulcer is 1 cm in diameter without drainage without depth or undermining.  There is no pain to palpation around the great toe.  Imaging: No results found. No images are attached to the encounter.  Labs: Lab Results  Component Value Date   CRP <1.0 12/27/2022   LABURIC 6.5 07/31/2023   REPTSTATUS 11/08/2014 FINAL 11/02/2014   CULT  11/02/2014    NO GROWTH 5 DAYS Performed at [facility]      Lab Results  Component Value Date   ALBUMIN 4.3 12/27/2022   ALBUMIN 4.5 12/02/2016   ALBUMIN 3.4 (L) 11/03/2014    No results found for: "MG" No results found for: "VD25OH"  No results found for: "PREALBUMIN"    Latest Ref Rng & Units 07/24/2023    4:36 PM  12/27/2022    7:29 AM 12/02/2016   10:21 AM  CBC EXTENDED  WBC 3.8 - 10.8 Thousand/uL 10.8  7.2  8.0   RBC 4.20 - 5.80 Million/uL 5.81  4.97  5.27   Hemoglobin 13.2 - 17.1 g/dL 19.1  47.8  29.5   HCT 38.5 - 50.0 % 52.6  44.9  45.5   Platelets 140 - 400 Thousand/uL 223  195.0  224.0   NEUT# 1,500 - 7,800 cells/uL 7,517  4.3  6.0   Lymph# 0.7 - 4.0 K/uL  1.7  1.0      There is no height or weight on file to calculate BMI.  Orders:  No orders of the defined types were placed in this encounter.  No orders of the defined types were placed in this encounter.    Procedures: No procedures performed  Clinical Data: No additional findings.  ROS:  All other systems negative, except as noted in the HPI. Review of Systems  Objective: Vital Signs: There were no vitals taken for this visit.  Specialty Comments:  No specialty comments available.  PMFS History: Patient Active Problem List   Diagnosis Date Noted   Tinnitus, bilateral 08/08/2018   Lumbar pain 02/23/2018   Degeneration of lumbar intervertebral disc  02/23/2018   Increased body mass index 02/23/2018   History of total knee arthroplasty 10/24/2017   Osteoarthritis 10/24/2017   Acute recurrent pansinusitis 09/13/2016   Chronic rhinitis 09/13/2016   Obstructive sleep apnea 09/13/2016   Primary osteoarthritis of left knee 04/30/2015   Left knee DJD 04/30/2015   Pneumonia 11/02/2014   Hypertension    Diabetes mellitus (HCC)    Hyperlipidemia    OTHER AND UNSPECIFIED COAGULATION DEFECTS 09/28/2010   Chronic a-fib (HCC) 09/28/2010   History of colonic polyps 09/28/2010   Past Medical History:  Diagnosis Date   Arthritis    osteoarthritis left knee   Atrial fibrillation (HCC)    BPH (benign prostatic hypertrophy)    Cardiac arrhythmia due to congenital heart disease    Cholelithiasis    Chronic atrial fibrillation (HCC)    Colon polyps    adenomatous   Diabetes mellitus    Diverticulosis    Dysrhythmia     tx. Pradaxa- Dr. Swaziland follows   Elevated LFTs    GERD (gastroesophageal reflux disease)    Hemorrhoids    Hyperlipidemia    Hypertension    Morbid obesity (HCC)    Sleep apnea    CPAP nightly   Tubular adenoma of colon     Family History  Problem Relation Age of Onset   Colon cancer Mother    Heart attack Father    Heart disease Father    Breast cancer Sister    Stomach cancer Neg Hx    Esophageal cancer Neg Hx     Past Surgical History:  Procedure Laterality Date   KNEE ARTHROSCOPY Left 03/28/2014   Procedure: LEFT KNEE ARTHROSCOPY WITH DEBRIDEMENT  ;  Surgeon: Javier Docker, MD;  Location: [facility];  Service: Orthopedics;  Laterality: Left;   TONSILLECTOMY     TOTAL KNEE ARTHROPLASTY Left 04/30/2015   Procedure: LEFT TOTAL KNEE ARTHROPLASTY;  Surgeon: Jene Every, MD;  Location: [facility];  Service: Orthopedics;  Laterality: Left;   US ECHOCARDIOGRAPHY  08/08/2006   ef 55-60%   Social History   Occupational History   Occupation: VP Insurance account manager: [employer]   Occupation: Airline pilot  Tobacco Use   Smoking status: Former    Current packs/day: 0.00    Types: Cigarettes    Quit date: 06/20/1991    Years since quitting: 32.1   Smokeless tobacco: Former  Building services engineer status: Never Used  Substance and Sexual Activity   Alcohol use: Yes    Alcohol/week: 0.0 standard drinks of alcohol    Comment: social   Drug use: No   Sexual activity: Yes

## 2023-08-21 ENCOUNTER — Ambulatory Visit: Payer: PPO | Admitting: Orthopedic Surgery

## 2023-08-21 ENCOUNTER — Encounter: Payer: Self-pay | Admitting: Orthopedic Surgery

## 2023-08-21 DIAGNOSIS — L97521 Non-pressure chronic ulcer of other part of left foot limited to breakdown of skin: Secondary | ICD-10-CM

## 2023-08-21 MED ORDER — COLCHICINE 0.6 MG PO TABS: 0.6000 mg | ORAL_TABLET | Freq: Every day | ORAL | 2 refills | Status: DC

## 2023-08-21 NOTE — Progress Notes (Signed)
Office Visit Note   Patient: Dan [name]           Date of Birth: [birth year]           MRN: [id number] Visit Date: 08/21/2023              Requested by: Geoffry Paradise, MD [address] [place],  [place] [address] PCP: Geoffry Paradise, MD  Chief Complaint  Patient presents with   Left Foot - Follow-up      HPI: Patient is a 72 year old gentleman who presents in follow-up for Wagner grade 1 ulcer first metatarsal head left foot.  Patient feels like he is improving well.  Assessment & Plan: Visit Diagnoses:  1. Skin ulcer of left great toe, limited to breakdown of skin (HCC)     Plan: Refill prescription sent in for colchicine.  A new felt donut was applied in the postoperative shoe.  Follow-Up Instructions: Return in about 2 weeks (around 09/04/2023).   Ortho Exam  Patient is alert, oriented, no adenopathy, well-dressed, normal affect, normal respiratory effort. Examination the ulcer is smaller there is healthy granulation tissue no cellulitis.  Patient has a palpable pulse.  After informed consent a 10 blade knife was used to debride the skin and soft tissue back to healthy viable bleeding granulation tissue.  Ulcer is 1 x 2 cm after debridement.  Imaging: No results found. No images are attached to the encounter.  Labs: Lab Results  Component Value Date   CRP <1.0 12/27/2022   LABURIC 6.5 07/31/2023   REPTSTATUS 11/08/2014 FINAL 11/02/2014   CULT  11/02/2014    NO GROWTH 5 DAYS Performed at [facility]      Lab Results  Component Value Date   ALBUMIN 4.3 12/27/2022   ALBUMIN 4.5 12/02/2016   ALBUMIN 3.4 (L) 11/03/2014    No results found for: "MG" No results found for: "VD25OH"  No results found for: "PREALBUMIN"    Latest Ref Rng & Units 07/24/2023    4:36 PM 12/27/2022    7:29 AM 12/02/2016   10:21 AM  CBC EXTENDED  WBC 3.8 - 10.8 Thousand/uL 10.8  7.2  8.0   RBC 4.20 - 5.80 Million/uL 5.81  4.97  5.27   Hemoglobin 13.2 - 17.1 g/dL  29.5  18.8  41.6   HCT 38.5 - 50.0 % 52.6  44.9  45.5   Platelets 140 - 400 Thousand/uL 223  195.0  224.0   NEUT# 1,500 - 7,800 cells/uL 7,517  4.3  6.0   Lymph# 0.7 - 4.0 K/uL  1.7  1.0      There is no height or weight on file to calculate BMI.  Orders:  No orders of the defined types were placed in this encounter.  No orders of the defined types were placed in this encounter.    Procedures: No procedures performed  Clinical Data: No additional findings.  ROS:  All other systems negative, except as noted in the HPI. Review of Systems  Objective: Vital Signs: There were no vitals taken for this visit.  Specialty Comments:  No specialty comments available.  PMFS History: Patient Active Problem List   Diagnosis Date Noted   Tinnitus, bilateral 08/08/2018   Lumbar pain 02/23/2018   Degeneration of lumbar intervertebral disc 02/23/2018   Increased body mass index 02/23/2018   History of total knee arthroplasty 10/24/2017   Osteoarthritis 10/24/2017   Acute recurrent pansinusitis 09/13/2016   Chronic rhinitis 09/13/2016   Obstructive sleep apnea  09/13/2016   Primary osteoarthritis of left knee 04/30/2015   Left knee DJD 04/30/2015   Pneumonia 11/02/2014   Hypertension    Diabetes mellitus (HCC)    Hyperlipidemia    OTHER AND UNSPECIFIED COAGULATION DEFECTS 09/28/2010   Chronic a-fib (HCC) 09/28/2010   History of colonic polyps 09/28/2010   Past Medical History:  Diagnosis Date   Arthritis    osteoarthritis left knee   Atrial fibrillation (HCC)    BPH (benign prostatic hypertrophy)    Cardiac arrhythmia due to congenital heart disease    Cholelithiasis    Chronic atrial fibrillation (HCC)    Colon polyps    adenomatous   Diabetes mellitus    Diverticulosis    Dysrhythmia    tx. Pradaxa- Dr. Swaziland follows   Elevated LFTs    GERD (gastroesophageal reflux disease)    Hemorrhoids    Hyperlipidemia    Hypertension    Morbid obesity (HCC)    Sleep  apnea    CPAP nightly   Tubular adenoma of colon     Family History  Problem Relation Age of Onset   Colon cancer Mother    Heart attack Father    Heart disease Father    Breast cancer Sister    Stomach cancer Neg Hx    Esophageal cancer Neg Hx     Past Surgical History:  Procedure Laterality Date   KNEE ARTHROSCOPY Left 03/28/2014   Procedure: LEFT KNEE ARTHROSCOPY WITH DEBRIDEMENT  ;  Surgeon: Javier Docker, MD;  Location: [facility] [place];  Service: Orthopedics;  Laterality: Left;   TONSILLECTOMY     TOTAL KNEE ARTHROPLASTY Left 04/30/2015   Procedure: LEFT TOTAL KNEE ARTHROPLASTY;  Surgeon: Jene Every, MD;  Location: [facility];  Service: Orthopedics;  Laterality: Left;   US ECHOCARDIOGRAPHY  08/08/2006   ef 55-60%   Social History   Occupational History   Occupation: VP Insurance account manager: [employer]   Occupation: Airline pilot  Tobacco Use   Smoking status: Former    Current packs/day: 0.00    Types: Cigarettes    Quit date: 06/20/1991    Years since quitting: 32.1   Smokeless tobacco: Former  Building services engineer status: Never Used  Substance and Sexual Activity   Alcohol use: Yes    Alcohol/week: 0.0 standard drinks of alcohol    Comment: social   Drug use: No   Sexual activity: Yes

## 2023-08-27 DIAGNOSIS — I110 Hypertensive heart disease with heart failure: Secondary | ICD-10-CM | POA: Diagnosis not present

## 2023-08-27 DIAGNOSIS — E261 Secondary hyperaldosteronism: Secondary | ICD-10-CM | POA: Diagnosis not present

## 2023-08-27 DIAGNOSIS — I4891 Unspecified atrial fibrillation: Secondary | ICD-10-CM | POA: Diagnosis not present

## 2023-08-27 DIAGNOSIS — E669 Obesity, unspecified: Secondary | ICD-10-CM | POA: Diagnosis not present

## 2023-08-27 DIAGNOSIS — I509 Heart failure, unspecified: Secondary | ICD-10-CM | POA: Diagnosis not present

## 2023-08-27 DIAGNOSIS — E1169 Type 2 diabetes mellitus with other specified complication: Secondary | ICD-10-CM | POA: Diagnosis not present

## 2023-08-27 DIAGNOSIS — E1142 Type 2 diabetes mellitus with diabetic polyneuropathy: Secondary | ICD-10-CM | POA: Diagnosis not present

## 2023-08-27 DIAGNOSIS — D6869 Other thrombophilia: Secondary | ICD-10-CM | POA: Diagnosis not present

## 2023-08-27 DIAGNOSIS — E1121 Type 2 diabetes mellitus with diabetic nephropathy: Secondary | ICD-10-CM | POA: Diagnosis not present

## 2023-08-27 DIAGNOSIS — K7460 Unspecified cirrhosis of liver: Secondary | ICD-10-CM | POA: Diagnosis not present

## 2023-08-27 DIAGNOSIS — E785 Hyperlipidemia, unspecified: Secondary | ICD-10-CM | POA: Diagnosis not present

## 2023-08-27 DIAGNOSIS — I480 Paroxysmal atrial fibrillation: Secondary | ICD-10-CM | POA: Diagnosis not present

## 2023-09-04 ENCOUNTER — Ambulatory Visit: Payer: PPO | Admitting: Orthopedic Surgery

## 2023-09-04 DIAGNOSIS — L97521 Non-pressure chronic ulcer of other part of left foot limited to breakdown of skin: Secondary | ICD-10-CM | POA: Diagnosis not present

## 2023-09-05 ENCOUNTER — Encounter: Payer: Self-pay | Admitting: Orthopedic Surgery

## 2023-09-05 ENCOUNTER — Other Ambulatory Visit: Payer: Self-pay | Admitting: Cardiology

## 2023-09-05 ENCOUNTER — Ambulatory Visit: Payer: PPO | Admitting: Internal Medicine

## 2023-09-05 ENCOUNTER — Encounter: Payer: Self-pay | Admitting: Internal Medicine

## 2023-09-05 VITALS — BP 118/66 | HR 81 | Ht 73.0 in | Wt 277.4 lb

## 2023-09-05 DIAGNOSIS — Z7901 Long term (current) use of anticoagulants: Secondary | ICD-10-CM | POA: Diagnosis not present

## 2023-09-05 DIAGNOSIS — I4819 Other persistent atrial fibrillation: Secondary | ICD-10-CM

## 2023-09-05 DIAGNOSIS — Z794 Long term (current) use of insulin: Secondary | ICD-10-CM | POA: Diagnosis not present

## 2023-09-05 DIAGNOSIS — E119 Type 2 diabetes mellitus without complications: Secondary | ICD-10-CM | POA: Diagnosis not present

## 2023-09-05 DIAGNOSIS — K219 Gastro-esophageal reflux disease without esophagitis: Secondary | ICD-10-CM

## 2023-09-05 DIAGNOSIS — I482 Chronic atrial fibrillation, unspecified: Secondary | ICD-10-CM

## 2023-09-05 DIAGNOSIS — Z860100 Personal history of colon polyps, unspecified: Secondary | ICD-10-CM | POA: Diagnosis not present

## 2023-09-05 DIAGNOSIS — K7460 Unspecified cirrhosis of liver: Secondary | ICD-10-CM | POA: Diagnosis not present

## 2023-09-05 MED ORDER — NA SULFATE-K SULFATE-MG SULF 17.5-3.13-1.6 GM/177ML PO SOLN: 1.0000 | Freq: Once | ORAL | 0 refills | Status: AC

## 2023-09-05 MED ORDER — OMEPRAZOLE 40 MG PO CPDR: 40.0000 mg | DELAYED_RELEASE_CAPSULE | Freq: Every day | ORAL | 3 refills | Status: DC

## 2023-09-05 NOTE — Telephone Encounter (Signed)
Eliquis 5mg  refill request received. Patient is 72 years old, weight-125.8kg, Crea-1.19 on 07/24/23, Diagnosis-Afib, and last seen by Dr. Swaziland on 03/06/23. Dose is appropriate based on dosing criteria. Will send in refill to requested pharmacy.

## 2023-09-05 NOTE — Progress Notes (Signed)
Office Visit Note   Patient: Dan [name]           Date of Birth: 02/25/1951           MRN: [id number] Visit Date: 09/04/2023              Requested by: Geoffry Paradise, MD [address] [place],  [place] [address] PCP: Geoffry Paradise, MD  Chief Complaint  Patient presents with   Left Foot - Follow-up      HPI: Patient is a 72 year old gentleman who presents in follow-up for [facility] grade 1 ulcer left great toe.  Patient is currently on colchicine and using a postoperative shoe with a felt relieving donut.  Assessment & Plan: Visit Diagnoses:  1. Skin ulcer of left great toe, limited to breakdown of skin (HCC)     Plan: A new felt relieving donut was applied.  Continue current wound care.  Follow-Up Instructions: Return in about 4 weeks (around 10/02/2023).   Ortho Exam  Patient is alert, oriented, no adenopathy, well-dressed, normal affect, normal respiratory effort. Examination patient has shown excellent improvement in the ulcer.  It is currently flat with healthy granulation tissue there is no cellulitis no tunneling.  Ulcer is 5 mm in diameter.  Imaging: No results found. No images are attached to the encounter.  Labs: Lab Results  Component Value Date   CRP <1.0 12/27/2022   LABURIC 6.5 07/31/2023   REPTSTATUS 11/08/2014 FINAL 11/02/2014   CULT  11/02/2014    NO GROWTH 5 DAYS Performed at [facility]      Lab Results  Component Value Date   ALBUMIN 4.3 12/27/2022   ALBUMIN 4.5 12/02/2016   ALBUMIN 3.4 (L) 11/03/2014    No results found for: "MG" No results found for: "VD25OH"  No results found for: "PREALBUMIN"    Latest Ref Rng & Units 07/24/2023    4:36 PM 12/27/2022    7:29 AM 12/02/2016   10:21 AM  CBC EXTENDED  WBC 3.8 - 10.8 Thousand/uL 10.8  7.2  8.0   RBC 4.20 - 5.80 Million/uL 5.81  4.97  5.27   Hemoglobin 13.2 - 17.1 g/dL 95.1  88.4  16.6   HCT 38.5 - 50.0 % 52.6  44.9  45.5   Platelets 140 - 400 Thousand/uL 223   195.0  224.0   NEUT# 1,500 - 7,800 cells/uL 7,517  4.3  6.0   Lymph# 0.7 - 4.0 K/uL  1.7  1.0      There is no height or weight on file to calculate BMI.  Orders:  No orders of the defined types were placed in this encounter.  No orders of the defined types were placed in this encounter.    Procedures: No procedures performed  Clinical Data: No additional findings.  ROS:  All other systems negative, except as noted in the HPI. Review of Systems  Objective: Vital Signs: There were no vitals taken for this visit.  Specialty Comments:  No specialty comments available.  PMFS History: Patient Active Problem List   Diagnosis Date Noted   Tinnitus, bilateral 08/08/2018   Lumbar pain 02/23/2018   Degeneration of lumbar intervertebral disc 02/23/2018   Increased body mass index 02/23/2018   History of total knee arthroplasty 10/24/2017   Osteoarthritis 10/24/2017   Acute recurrent pansinusitis 09/13/2016   Chronic rhinitis 09/13/2016   Obstructive sleep apnea 09/13/2016   Primary osteoarthritis of left knee 04/30/2015   Left knee DJD 04/30/2015   Pneumonia 11/02/2014  Hypertension    Diabetes mellitus (HCC)    Hyperlipidemia    OTHER AND UNSPECIFIED COAGULATION DEFECTS 09/28/2010   Chronic a-fib (HCC) 09/28/2010   History of colonic polyps 09/28/2010   Past Medical History:  Diagnosis Date   Arthritis    osteoarthritis left knee   Atrial fibrillation (HCC)    BPH (benign prostatic hypertrophy)    Cardiac arrhythmia due to congenital heart disease    Cholelithiasis    Chronic atrial fibrillation (HCC)    Colon polyps    adenomatous   Diabetes mellitus    Diverticulosis    Dysrhythmia    tx. Pradaxa- Dr. Swaziland follows   Elevated LFTs    GERD (gastroesophageal reflux disease)    Hemorrhoids    Hyperlipidemia    Hypertension    Morbid obesity (HCC)    Sleep apnea    CPAP nightly   Tubular adenoma of colon     Family History  Problem Relation Age of  Onset   Colon cancer Mother    Heart attack Father    Heart disease Father    Breast cancer Sister    Stomach cancer Neg Hx    Esophageal cancer Neg Hx     Past Surgical History:  Procedure Laterality Date   KNEE ARTHROSCOPY Left 03/28/2014   Procedure: LEFT KNEE ARTHROSCOPY WITH DEBRIDEMENT  ;  Surgeon: Javier Docker, MD;  Location: [facility] ;  Service: Orthopedics;  Laterality: Left;   TONSILLECTOMY     TOTAL KNEE ARTHROPLASTY Left 04/30/2015   Procedure: LEFT TOTAL KNEE ARTHROPLASTY;  Surgeon: Jene Every, MD;  Location: [facility];  Service: Orthopedics;  Laterality: Left;   US ECHOCARDIOGRAPHY  08/08/2006   ef 55-60%   Social History   Occupational History   Occupation: VP Insurance account manager: [employer]   Occupation: Airline pilot  Tobacco Use   Smoking status: Former    Current packs/day: 0.00    Types: Cigarettes    Quit date: 06/20/1991    Years since quitting: 32.2   Smokeless tobacco: Former  Building services engineer status: Never Used  Substance and Sexual Activity   Alcohol use: Yes    Alcohol/week: 0.0 standard drinks of alcohol    Comment: social   Drug use: No   Sexual activity: Yes

## 2023-09-05 NOTE — Progress Notes (Signed)
HISTORY OF PRESENT ILLNESS:  Dan [name] is a 72 y.o. male with multiple significant medical problems as listed below including permanent atrial fibrillation for which she is on chronic Eliquis therapy.  Patient has been seen by myself for GERD, history of compensated cirrhosis, and multiple adenomatous colon polyps.  He was last seen in this office April 2024 regarding C. difficile colitis which was treated with vancomycin, successfully.  He presents today regarding surveillance colonoscopy and surveillance upper endoscopy.  He has been well since our last visit.  Last cardiology office visit March 06, 2023.  Reviewed.  Favorable.  He does have diabetes mellitus and is on Ozempic.  His only active complaint is reflux disease for which she requests a prescription of omeprazole, which we had prescribed previously, and he found effective.  No dysphagia.  It should be noted that his last colonoscopy and upper endoscopy were performed April 2019.  Colonoscopy revealed diminutive polyps, left-sided diverticulosis and hemorrhoids.  Upper endoscopy was normal.  No varices.  REVIEW OF SYSTEMS:  All non-GI ROS negative unless otherwise stated in the HPI except for left toe ulcer  Past Medical History:  Diagnosis Date   Arthritis    osteoarthritis left knee   Atrial fibrillation (HCC)    BPH (benign prostatic hypertrophy)    Cardiac arrhythmia due to congenital heart disease    Cholelithiasis    Chronic atrial fibrillation (HCC)    Colon polyps    adenomatous   Diabetes mellitus    Diverticulosis    Dysrhythmia    tx. Pradaxa- Dr. Swaziland follows   Elevated LFTs    GERD (gastroesophageal reflux disease)    Hemorrhoids    Hyperlipidemia    Hypertension    Morbid obesity (HCC)    Sleep apnea    CPAP nightly   Tubular adenoma of colon     Past Surgical History:  Procedure Laterality Date   KNEE ARTHROSCOPY Left 03/28/2014   Procedure: LEFT KNEE ARTHROSCOPY WITH DEBRIDEMENT  ;  Surgeon:  Javier Docker, MD;  Location: [facility] [place];  Service: Orthopedics;  Laterality: Left;   TONSILLECTOMY     TOTAL KNEE ARTHROPLASTY Left 04/30/2015   Procedure: LEFT TOTAL KNEE ARTHROPLASTY;  Surgeon: Jene Every, MD;  Location: [facility];  Service: Orthopedics;  Laterality: Left;   US ECHOCARDIOGRAPHY  08/08/2006   ef 55-60%    Social History LUX CULLINAN  reports that he quit smoking about 32 years ago. His smoking use included cigarettes. He has quit using smokeless tobacco. He reports current alcohol use. He reports that he does not use drugs.  family history includes Breast cancer in his sister; Colon cancer in his mother; Heart attack in his father; Heart disease in his father.  Allergies  Allergen Reactions   Macadamia Nut Oil Shortness Of Breath   Amoxicillin-Pot Clavulanate Other (See Comments)    Pt said he isnt allergic 09/05/2023       PHYSICAL EXAMINATION: Vital signs: BP 118/66   Pulse 81   Ht 6\' 1"  (1.854 m)   Wt 277 lb 6 oz (125.8 kg) Comment: with boot. pt verablized 264 this morning  BMI 36.60 kg/m   Constitutional: Pleasant, obese, generally well-appearing, no acute distress Psychiatric: alert and oriented x3, cooperative Eyes: extraocular movements intact, anicteric, conjunctiva pink Mouth: oral pharynx moist, no lesions Neck: supple no lymphadenopathy Cardiovascular: heart regular rate and rhythm, no murmur Lungs: clear to auscultation bilaterally Abdomen: soft, obese, nontender, nondistended, no obvious ascites,  no peritoneal signs, normal bowel sounds, no organomegaly Rectal: Deferred until colonoscopy Extremities: no clubbing or cyanosis.  Trace lower extremity edema bilaterally Skin: no lesions on visible extremities Neuro: No focal deficits. No asterixis.    ASSESSMENT:  1.  History of multiple adenomatous colon polyps.  Due for surveillance 2.  History of compensated cirrhosis.  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 3.   Permanent atrial fibrillation on Eliquis 5.  Diabetes mellitus 6.  Obesity   PLAN:  1.  Schedule surveillance colonoscopy.  The patient is high risk given his comorbidities and the need to address his anticoagulation and diabetic medications.The nature of the procedure, as well as the risks, benefits, and alternatives were carefully and thoroughly reviewed with the patient. Ample time for discussion and questions allowed. The patient understood, was satisfied, and agreed to proceed. 2.  Schedule upper endoscopy for variceal screening.  Patient is high risk as above.The nature of the procedure, as well as the risks, benefits, and alternatives were carefully and thoroughly reviewed with the patient. Ample time for discussion and questions allowed. The patient understood, was satisfied, and agreed to proceed. 3.  Adjust diabetic medications preprocedure 4.  Hold Ozempic for at least 1 week prior to the procedures 5.  Hold Eliquis 3 days prior to procedure.  We reviewed the pros and cons of this strategy.  We did this same adjustment at the time of his previous procedure.  He is aware. 6.  Ongoing general medical care with Dr. Jacky Kindle

## 2023-09-05 NOTE — Patient Instructions (Signed)
We have sent the following medications to your pharmacy for you to pick up at your convenience:  Omeprazole.  You have been scheduled for an endoscopy and colonoscopy. Please follow the written instructions given to you at your visit today.  Please pick up your prep supplies at the pharmacy within the next 1-3 days.  If you use inhalers (even only as needed), please bring them with you on the day of your procedure.  DO NOT TAKE 7 DAYS PRIOR TO TEST- Trulicity (dulaglutide) Ozempic, Wegovy (semaglutide) Mounjaro (tirzepatide) Bydureon Bcise (exanatide extended release)  DO NOT TAKE 1 DAY PRIOR TO YOUR TEST Rybelsus (semaglutide) Adlyxin (lixisenatide) Victoza (liraglutide) Byetta (exanatide) ___________________________________________________________________________

## 2023-09-06 DIAGNOSIS — I10 Essential (primary) hypertension: Secondary | ICD-10-CM | POA: Diagnosis not present

## 2023-09-06 DIAGNOSIS — E1142 Type 2 diabetes mellitus with diabetic polyneuropathy: Secondary | ICD-10-CM | POA: Diagnosis not present

## 2023-09-06 DIAGNOSIS — E785 Hyperlipidemia, unspecified: Secondary | ICD-10-CM | POA: Diagnosis not present

## 2023-09-06 DIAGNOSIS — Z125 Encounter for screening for malignant neoplasm of prostate: Secondary | ICD-10-CM | POA: Diagnosis not present

## 2023-09-10 DIAGNOSIS — G4733 Obstructive sleep apnea (adult) (pediatric): Secondary | ICD-10-CM | POA: Diagnosis not present

## 2023-09-13 DIAGNOSIS — E1142 Type 2 diabetes mellitus with diabetic polyneuropathy: Secondary | ICD-10-CM | POA: Diagnosis not present

## 2023-09-13 DIAGNOSIS — Z1331 Encounter for screening for depression: Secondary | ICD-10-CM | POA: Diagnosis not present

## 2023-09-13 DIAGNOSIS — Z7901 Long term (current) use of anticoagulants: Secondary | ICD-10-CM | POA: Diagnosis not present

## 2023-09-13 DIAGNOSIS — I119 Hypertensive heart disease without heart failure: Secondary | ICD-10-CM | POA: Diagnosis not present

## 2023-09-13 DIAGNOSIS — E785 Hyperlipidemia, unspecified: Secondary | ICD-10-CM | POA: Diagnosis not present

## 2023-09-13 DIAGNOSIS — Z1339 Encounter for screening examination for other mental health and behavioral disorders: Secondary | ICD-10-CM | POA: Diagnosis not present

## 2023-09-13 DIAGNOSIS — D6869 Other thrombophilia: Secondary | ICD-10-CM | POA: Diagnosis not present

## 2023-09-13 DIAGNOSIS — E669 Obesity, unspecified: Secondary | ICD-10-CM | POA: Diagnosis not present

## 2023-09-13 DIAGNOSIS — I480 Paroxysmal atrial fibrillation: Secondary | ICD-10-CM | POA: Diagnosis not present

## 2023-09-13 DIAGNOSIS — L97521 Non-pressure chronic ulcer of other part of left foot limited to breakdown of skin: Secondary | ICD-10-CM | POA: Diagnosis not present

## 2023-09-13 DIAGNOSIS — E11621 Type 2 diabetes mellitus with foot ulcer: Secondary | ICD-10-CM | POA: Diagnosis not present

## 2023-09-13 DIAGNOSIS — Z0000 Encounter for general adult medical examination without abnormal findings: Secondary | ICD-10-CM | POA: Diagnosis not present

## 2023-09-13 DIAGNOSIS — R82998 Other abnormal findings in urine: Secondary | ICD-10-CM | POA: Diagnosis not present

## 2023-09-20 DIAGNOSIS — E1169 Type 2 diabetes mellitus with other specified complication: Secondary | ICD-10-CM | POA: Diagnosis not present

## 2023-09-20 DIAGNOSIS — I10 Essential (primary) hypertension: Secondary | ICD-10-CM | POA: Diagnosis not present

## 2023-09-28 DIAGNOSIS — E119 Type 2 diabetes mellitus without complications: Secondary | ICD-10-CM | POA: Diagnosis not present

## 2023-09-28 DIAGNOSIS — H02834 Dermatochalasis of left upper eyelid: Secondary | ICD-10-CM | POA: Diagnosis not present

## 2023-09-28 DIAGNOSIS — H5203 Hypermetropia, bilateral: Secondary | ICD-10-CM | POA: Diagnosis not present

## 2023-09-28 DIAGNOSIS — H02831 Dermatochalasis of right upper eyelid: Secondary | ICD-10-CM | POA: Diagnosis not present

## 2023-09-28 DIAGNOSIS — H52203 Unspecified astigmatism, bilateral: Secondary | ICD-10-CM | POA: Diagnosis not present

## 2023-09-28 DIAGNOSIS — D3132 Benign neoplasm of left choroid: Secondary | ICD-10-CM | POA: Diagnosis not present

## 2023-10-03 ENCOUNTER — Ambulatory Visit: Payer: PPO | Admitting: Orthopedic Surgery

## 2023-10-03 DIAGNOSIS — L97521 Non-pressure chronic ulcer of other part of left foot limited to breakdown of skin: Secondary | ICD-10-CM | POA: Diagnosis not present

## 2023-10-09 ENCOUNTER — Encounter: Payer: Self-pay | Admitting: Orthopedic Surgery

## 2023-10-09 NOTE — Progress Notes (Signed)
 Office Visit Note   Patient: Dan [name]           Date of Birth: [birth year]           MRN: [id number] Visit Date: 10/03/2023              Requested by: Shepard Ade, MD [address] [place],  [place] [address] PCP: Shepard Ade, MD  Chief Complaint  Patient presents with   Left Foot - Follow-up      HPI: Patient is a 73 year old gentleman who presents in follow-up for [facility] grade 1 ulcer plantar aspect MTP joint left great toe.  Patient is a postoperative shoe with a felt relieving donut.  Assessment & Plan: Visit Diagnoses:  1. Skin ulcer of left great toe, limited to breakdown of skin (HCC)     Plan: Patient shows slow interval healing.  We will provide a new felt relieving donut continue with the postoperative shoe and dry dressing changes daily.  Follow-Up Instructions: Return in about 4 weeks (around 10/31/2023).   Ortho Exam  Patient is alert, oriented, no adenopathy, well-dressed, normal affect, normal respiratory effort. Examination patient has a palpable pulse he has a Wagner grade 1 ulcer beneath the first metatarsal head.  After informed consent a 10 blade knife was used to debride the skin and soft tissue back to healthy viable granulation tissue.  Silver nitrate was used for hemostasis.  The ulcer measures 2 cm in diameter and 1 mm deep after debridement.  There is no tunneling no exposed bone or tendon.  Imaging: No results found. No images are attached to the encounter.  Labs: Lab Results  Component Value Date   CRP <1.0 12/27/2022   LABURIC 6.5 07/31/2023   REPTSTATUS 11/08/2014 FINAL 11/02/2014   CULT  11/02/2014    NO GROWTH 5 DAYS Performed at [facility]      Lab Results  Component Value Date   ALBUMIN 4.3 12/27/2022   ALBUMIN 4.5 12/02/2016   ALBUMIN 3.4 (L) 11/03/2014    No results found for: MG No results found for: VD25OH  No results found for: PREALBUMIN    Latest Ref Rng & Units 07/24/2023    4:36  PM 12/27/2022    7:29 AM 12/02/2016   10:21 AM  CBC EXTENDED  WBC 3.8 - 10.8 Thousand/uL 10.8  7.2  8.0   RBC 4.20 - 5.80 Million/uL 5.81  4.97  5.27   Hemoglobin 13.2 - 17.1 g/dL 82.7  84.7  84.9   HCT 38.5 - 50.0 % 52.6  44.9  45.5   Platelets 140 - 400 Thousand/uL 223  195.0  224.0   NEUT# 1,500 - 7,800 cells/uL 7,517  4.3  6.0   Lymph# 0.7 - 4.0 K/uL  1.7  1.0      There is no height or weight on file to calculate BMI.  Orders:  No orders of the defined types were placed in this encounter.  No orders of the defined types were placed in this encounter.    Procedures: No procedures performed  Clinical Data: No additional findings.  ROS:  All other systems negative, except as noted in the HPI. Review of Systems  Objective: Vital Signs: There were no vitals taken for this visit.  Specialty Comments:  No specialty comments available.  PMFS History: Patient Active Problem List   Diagnosis Date Noted   Tinnitus, bilateral 08/08/2018   Lumbar pain 02/23/2018   Degeneration of lumbar intervertebral disc 02/23/2018  Increased body mass index 02/23/2018   History of total knee arthroplasty 10/24/2017   Osteoarthritis 10/24/2017   Acute recurrent pansinusitis 09/13/2016   Chronic rhinitis 09/13/2016   Obstructive sleep apnea 09/13/2016   Primary osteoarthritis of left knee 04/30/2015   Left knee DJD 04/30/2015   Pneumonia 11/02/2014   Hypertension    Diabetes mellitus (HCC)    Hyperlipidemia    OTHER AND UNSPECIFIED COAGULATION DEFECTS 09/28/2010   Chronic a-fib (HCC) 09/28/2010   History of colonic polyps 09/28/2010   Past Medical History:  Diagnosis Date   Arthritis    osteoarthritis left knee   Atrial fibrillation (HCC)    BPH (benign prostatic hypertrophy)    Cardiac arrhythmia due to congenital heart disease    Cholelithiasis    Chronic atrial fibrillation (HCC)    Colon polyps    adenomatous   Diabetes mellitus    Diverticulosis    Dysrhythmia     tx. Pradaxa - Dr. Jordan follows   Elevated LFTs    GERD (gastroesophageal reflux disease)    Hemorrhoids    Hyperlipidemia    Hypertension    Morbid obesity (HCC)    Sleep apnea    CPAP nightly   Tubular adenoma of colon     Family History  Problem Relation Age of Onset   Colon cancer Mother    Heart attack Father    Heart disease Father    Breast cancer Sister    Stomach cancer Neg Hx    Esophageal cancer Neg Hx     Past Surgical History:  Procedure Laterality Date   KNEE ARTHROSCOPY Left 03/28/2014   Procedure: LEFT KNEE ARTHROSCOPY WITH DEBRIDEMENT  ;  Surgeon: Reyes JAYSON Billing, MD;  Location: [facility];  Service: Orthopedics;  Laterality: Left;   TONSILLECTOMY     TOTAL KNEE ARTHROPLASTY Left 04/30/2015   Procedure: LEFT TOTAL KNEE ARTHROPLASTY;  Surgeon: Reyes Billing, MD;  Location: [facility];  Service: Orthopedics;  Laterality: Left;   US  ECHOCARDIOGRAPHY  08/08/2006   ef 55-60%   Social History   Occupational History   Occupation: VP Insurance Account Manager: [employer]   Occupation: airline pilot  Tobacco Use   Smoking status: Former    Current packs/day: 0.00    Types: Cigarettes    Quit date: 06/20/1991    Years since quitting: 32.3   Smokeless tobacco: Former  Building Services Engineer status: Never Used  Substance and Sexual Activity   Alcohol  use: Yes    Alcohol /week: 0.0 standard drinks of alcohol     Comment: social   Drug use: No   Sexual activity: Yes

## 2023-10-12 DIAGNOSIS — G4733 Obstructive sleep apnea (adult) (pediatric): Secondary | ICD-10-CM | POA: Diagnosis not present

## 2023-10-24 ENCOUNTER — Telehealth: Payer: Self-pay | Admitting: Internal Medicine

## 2023-10-24 NOTE — Telephone Encounter (Signed)
Spoke with pt and let him know that he only needed to hold the ozempic for 7days so he should be fine to proceed with the colonoscopy as scheduled. He verbalized understanding.

## 2023-10-24 NOTE — Telephone Encounter (Signed)
Patient called and stated that he did not see on the instruction that it stated not to take Ozempic 14 prior to procedure. Patient Stated that he took 1 injection 11 days ago and is wondering if he needs to possible reschedule. Patient is requesting a call back. Please advise.

## 2023-10-25 ENCOUNTER — Telehealth: Payer: Self-pay | Admitting: Internal Medicine

## 2023-10-25 ENCOUNTER — Encounter: Payer: Self-pay | Admitting: Internal Medicine

## 2023-10-25 NOTE — Telephone Encounter (Signed)
Inbound call from patient requesting a call to discuss coverage for 1/29 procedure. Please advise, thank you.

## 2023-10-26 DIAGNOSIS — Z23 Encounter for immunization: Secondary | ICD-10-CM | POA: Diagnosis not present

## 2023-10-26 DIAGNOSIS — E1169 Type 2 diabetes mellitus with other specified complication: Secondary | ICD-10-CM | POA: Diagnosis not present

## 2023-10-26 DIAGNOSIS — E785 Hyperlipidemia, unspecified: Secondary | ICD-10-CM | POA: Diagnosis not present

## 2023-10-26 DIAGNOSIS — I10 Essential (primary) hypertension: Secondary | ICD-10-CM | POA: Diagnosis not present

## 2023-10-26 DIAGNOSIS — R945 Abnormal results of liver function studies: Secondary | ICD-10-CM | POA: Diagnosis not present

## 2023-10-26 DIAGNOSIS — E669 Obesity, unspecified: Secondary | ICD-10-CM | POA: Diagnosis not present

## 2023-11-01 ENCOUNTER — Ambulatory Visit: Payer: PPO | Admitting: Internal Medicine

## 2023-11-01 ENCOUNTER — Encounter: Payer: Self-pay | Admitting: Internal Medicine

## 2023-11-01 VITALS — BP 124/90 | HR 83 | Temp 96.8°F | Resp 18 | Ht 73.0 in | Wt 277.0 lb

## 2023-11-01 DIAGNOSIS — K5520 Angiodysplasia of colon without hemorrhage: Secondary | ICD-10-CM

## 2023-11-01 DIAGNOSIS — K7460 Unspecified cirrhosis of liver: Secondary | ICD-10-CM | POA: Diagnosis not present

## 2023-11-01 DIAGNOSIS — Z860100 Personal history of colon polyps, unspecified: Secondary | ICD-10-CM

## 2023-11-01 DIAGNOSIS — K219 Gastro-esophageal reflux disease without esophagitis: Secondary | ICD-10-CM | POA: Diagnosis not present

## 2023-11-01 DIAGNOSIS — D175 Benign lipomatous neoplasm of intra-abdominal organs: Secondary | ICD-10-CM | POA: Diagnosis not present

## 2023-11-01 DIAGNOSIS — G4733 Obstructive sleep apnea (adult) (pediatric): Secondary | ICD-10-CM | POA: Diagnosis not present

## 2023-11-01 DIAGNOSIS — Z1211 Encounter for screening for malignant neoplasm of colon: Secondary | ICD-10-CM | POA: Diagnosis not present

## 2023-11-01 DIAGNOSIS — K766 Portal hypertension: Secondary | ICD-10-CM | POA: Diagnosis not present

## 2023-11-01 DIAGNOSIS — K648 Other hemorrhoids: Secondary | ICD-10-CM

## 2023-11-01 DIAGNOSIS — D122 Benign neoplasm of ascending colon: Secondary | ICD-10-CM | POA: Diagnosis not present

## 2023-11-01 DIAGNOSIS — K3189 Other diseases of stomach and duodenum: Secondary | ICD-10-CM | POA: Diagnosis not present

## 2023-11-01 DIAGNOSIS — I10 Essential (primary) hypertension: Secondary | ICD-10-CM | POA: Diagnosis not present

## 2023-11-01 DIAGNOSIS — K5730 Diverticulosis of large intestine without perforation or abscess without bleeding: Secondary | ICD-10-CM

## 2023-11-01 DIAGNOSIS — I4891 Unspecified atrial fibrillation: Secondary | ICD-10-CM | POA: Diagnosis not present

## 2023-11-01 DIAGNOSIS — Z860101 Personal history of adenomatous and serrated colon polyps: Secondary | ICD-10-CM

## 2023-11-01 DIAGNOSIS — D125 Benign neoplasm of sigmoid colon: Secondary | ICD-10-CM

## 2023-11-01 MED ORDER — SODIUM CHLORIDE 0.9 % IV SOLN: 500.0000 mL | Freq: Once | INTRAVENOUS | Status: DC

## 2023-11-01 NOTE — Progress Notes (Signed)
Called to room to assist during endoscopic procedure.  Patient ID and intended procedure confirmed with present staff. Received instructions for my participation in the procedure from the performing physician.

## 2023-11-01 NOTE — Op Note (Signed)
[facility] Patient Name: Dan [name] Procedure Date: 11/01/2023 8:10 AM MRN: [id number] Endoscopist: Wilhemina Bonito. Marina Goodell , MD, [id number] Age: 73 Referring MD:  Date of Birth: 03-Jul-1951 Gender: Male Account #: [id number] Procedure:                Colonoscopy with cold snare polypectomy x 1; biopsy                            x 1 Indications:              High risk colon cancer surveillance: Personal                            history of multiple (3 or more) adenomas. Previous                            examinations 2012, 2016, 2019 Medicines:                Monitored Anesthesia Care Procedure:                Pre-Anesthesia Assessment:                           - Prior to the procedure, a History and Physical                            was performed, and patient medications and                            allergies were reviewed. The patient's tolerance of                            previous anesthesia was also reviewed. The risks                            and benefits of the procedure and the sedation                            options and risks were discussed with the patient.                            All questions were answered, and informed consent                            was obtained. Prior Anticoagulants: The patient has                            taken Eliquis (apixaban), last dose was 4 days                            prior to procedure. ASA Grade Assessment: III - A                            patient with severe systemic disease. After  reviewing the risks and benefits, the patient was                            deemed in satisfactory condition to undergo the                            procedure.                           After obtaining informed consent, the colonoscope                            was passed under direct vision. Throughout the                            procedure, the patient's blood pressure, pulse, and                             oxygen saturations were monitored continuously. The                            CF [id number] #[id number] was introduced through the anus                            and advanced to the the cecum, identified by                            appendiceal orifice and ileocecal valve. The                            ileocecal valve, appendiceal orifice, and rectum                            were photographed. The quality of the bowel                            preparation was excellent. The colonoscopy was                            performed without difficulty. The patient tolerated                            the procedure well. The bowel preparation used was                            SUPREP via split dose instruction. Scope In: 8:52:21 AM Scope Out: 9:06:19 AM Scope Withdrawal Time: 0 hours 12 minutes 24 seconds  Total Procedure Duration: 0 hours 13 minutes 58 seconds  Findings:                 A 5 mm polyp was found in the ascending colon. The                            polyp was removed with a cold snare. Resection and  retrieval were complete.                           A 1 mm polyp was found in the sigmoid colon. The                            polyp was removed with a jumbo cold forceps.                            Resection and retrieval were complete.                           Many diverticula were found in the colon. Small                            nonbleeding AVM in the cecum. Small cecal lipoma.                           Internal hemorrhoids were found during retroflexion.                           The exam was otherwise without abnormality on                            direct and retroflexion views. Complications:            No immediate complications. Estimated blood loss:                            None. Estimated Blood Loss:     Estimated blood loss: none. Impression:               - One 5 mm polyp in the ascending colon, removed                             with a cold snare. Resected and retrieved.                           - One 1 mm polyp in the sigmoid colon, removed with                            a jumbo cold forceps. Resected and retrieved.                           - Diverticulosis. Cecal AVM. Cecal lipoma                           - Internal hemorrhoids.                           - The examination was otherwise normal on direct                            and retroflexion views. Recommendation:           - Repeat colonoscopy is  not recommended for                            surveillance.                           - Resume Eliquis (apixaban) today at prior dose.                           - Patient has a contact number available for                            emergencies. The signs and symptoms of potential                            delayed complications were discussed with the                            patient. Return to normal activities tomorrow.                            Written discharge instructions were provided to the                            patient.                           - Resume previous diet.                           - Continue present medications.                           - Await pathology results. Wilhemina Bonito. Marina Goodell, MD 11/01/2023 9:36:29 AM This report has been signed electronically.

## 2023-11-01 NOTE — Progress Notes (Signed)
Expand All Collapse All HISTORY OF PRESENT ILLNESS:   Dan [name] is a 73 y.o. male with multiple significant medical problems as listed below including permanent atrial fibrillation for which she is on chronic Eliquis therapy.  Patient has been seen by myself for GERD, history of compensated cirrhosis, and multiple adenomatous colon polyps.   He was last seen in this office April 2024 regarding C. difficile colitis which was treated with vancomycin, successfully.  He presents today regarding surveillance colonoscopy and surveillance upper endoscopy.  He has been well since our last visit.  Last cardiology office visit March 06, 2023.  Reviewed.  Favorable.  He does have diabetes mellitus and is on Ozempic.  His only active complaint is reflux disease for which she requests a prescription of omeprazole, which we had prescribed previously, and he found effective.  No dysphagia.  It should be noted that his last colonoscopy and upper endoscopy were performed April 2019.  Colonoscopy revealed diminutive polyps, left-sided diverticulosis and hemorrhoids.  Upper endoscopy was normal.  No varices.   REVIEW OF SYSTEMS:   All non-GI ROS negative unless otherwise stated in the HPI except for left toe ulcer       Past Medical History:  Diagnosis Date   Arthritis      osteoarthritis left knee   Atrial fibrillation (HCC)     BPH (benign prostatic hypertrophy)     Cardiac arrhythmia due to congenital heart disease     Cholelithiasis     Chronic atrial fibrillation (HCC)     Colon polyps      adenomatous   Diabetes mellitus     Diverticulosis     Dysrhythmia      tx. Pradaxa- Dr. [place] follows   Elevated LFTs     GERD (gastroesophageal reflux disease)     Hemorrhoids     Hyperlipidemia     Hypertension     Morbid obesity (HCC)     Sleep apnea      CPAP nightly   Tubular adenoma of colon                 Past Surgical History:  Procedure Laterality Date   KNEE ARTHROSCOPY Left  03/28/2014    Procedure: LEFT KNEE ARTHROSCOPY WITH DEBRIDEMENT  ;  Surgeon: Javier Docker, MD;  Location: [facility];  Service: Orthopedics;  Laterality: Left;   TONSILLECTOMY       TOTAL KNEE ARTHROPLASTY Left 04/30/2015    Procedure: LEFT TOTAL KNEE ARTHROPLASTY;  Surgeon: Jene Every, MD;  Location: [facility];  Service: Orthopedics;  Laterality: Left;   US ECHOCARDIOGRAPHY   08/08/2006    ef 55-60%          Social History [name]  reports that he quit smoking about 32 years ago. His smoking use included cigarettes. He has quit using smokeless tobacco. He reports current alcohol use. He reports that he does not use drugs.   family history includes Breast cancer in his sister; Colon cancer in his mother; Heart attack in his father; Heart disease in his father.   Allergies       Allergies  Allergen Reactions   Macadamia Nut Oil Shortness Of Breath   Amoxicillin-Pot Clavulanate Other (See Comments)      Pt said he isnt allergic 09/05/2023            PHYSICAL EXAMINATION: Vital signs: BP 118/66   Pulse 81   Ht 6\' 1"  (1.854 m)  Wt 277 lb 6 oz (125.8 kg) Comment: with boot. pt verablized 264 this morning  BMI 36.60 kg/m   Constitutional: Pleasant, obese, generally well-appearing, no acute distress Psychiatric: alert and oriented x3, cooperative Eyes: extraocular movements intact, anicteric, conjunctiva pink Mouth: oral pharynx moist, no lesions Neck: supple no lymphadenopathy Cardiovascular: heart regular rate and rhythm, no murmur Lungs: clear to auscultation bilaterally Abdomen: soft, obese, nontender, nondistended, no obvious ascites, no peritoneal signs, normal bowel sounds, no organomegaly Rectal: Deferred until colonoscopy Extremities: no clubbing or cyanosis.  Trace lower extremity edema bilaterally Skin: no lesions on visible extremities Neuro: No focal deficits. No asterixis.       ASSESSMENT:   1.  History of multiple adenomatous  colon polyps.  Due for surveillance 2.  History of compensated cirrhosis.  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 3.  Permanent atrial fibrillation on Eliquis 5.  Diabetes mellitus 6.  Obesity     PLAN:   1.  Schedule surveillance colonoscopy.  The patient is high risk given his comorbidities and the need to address his anticoagulation and diabetic medications.The nature of the procedure, as well as the risks, benefits, and alternatives were carefully and thoroughly reviewed with the patient. Ample time for discussion and questions allowed. The patient understood, was satisfied, and agreed to proceed. 2.  Schedule upper endoscopy for variceal screening.  Patient is high risk as above.The nature of the procedure, as well as the risks, benefits, and alternatives were carefully and thoroughly reviewed with the patient. Ample time for discussion and questions allowed. The patient understood, was satisfied, and agreed to proceed. 3.  Adjust diabetic medications preprocedure 4.  Hold Ozempic for at least 1 week prior to the procedures 5.  Hold Eliquis 3 days prior to procedure.  We reviewed the pros and cons of this strategy.  We did this same adjustment at the time of his previous procedure.  He is aware. 6.  Ongoing general medical care with Dr. Jacky Kindle     Recent H&P as above.  No interval change.  He has held Eliquis for 3 days and Ozempic for greater than 1 week

## 2023-11-01 NOTE — Progress Notes (Signed)
Sedate, gd SR, tolerated procedure well, VSS, report to RN

## 2023-11-01 NOTE — Op Note (Signed)
[facility] Patient Name: Jandel Patriarca Procedure Date: 11/01/2023 8:10 AM MRN: [id number] Endoscopist: Wilhemina Bonito. Marina Goodell , MD, [id number] Age: 73 Referring MD:  Date of Birth: 21-Oct-1950 Gender: Male Account #: [id number] Procedure:                Upper GI endoscopy Indications:              Cirrhosis rule out esophageal varices, GERD.                            Previous EGD 2019 was negative for varices Medicines:                Monitored Anesthesia Care Procedure:                Pre-Anesthesia Assessment:                           - Prior to the procedure, a History and Physical                            was performed, and patient medications and                            allergies were reviewed. The patient's tolerance of                            previous anesthesia was also reviewed. The risks                            and benefits of the procedure and the sedation                            options and risks were discussed with the patient.                            All questions were answered, and informed consent                            was obtained. Prior Anticoagulants: The patient has                            taken Eliquis (apixaban), last dose was 4 days                            prior to procedure. ASA Grade Assessment: III - A                            patient with severe systemic disease. After                            reviewing the risks and benefits, the patient was                            deemed in satisfactory condition to undergo the  procedure.                           After obtaining informed consent, the endoscope was                            passed under direct vision. Throughout the                            procedure, the patient's blood pressure, pulse, and                            oxygen saturations were monitored continuously. The                            GIF HQ190 #[id number] was introduced through  the                            mouth, and advanced to the second part of duodenum.                            The upper GI endoscopy was accomplished without                            difficulty. The patient tolerated the procedure                            well. Scope In: Scope Out: Findings:                 The esophagus was normal. NO varices                           The stomach revealed mild changes of portal                            hypertensive gastropathy. Small hiatal hernia..                           The examined duodenum was normal.                           The cardia and gastric fundus were normal on                            retroflexion. Complications:            No immediate complications. Estimated Blood Loss:     Estimated blood loss: none. Impression:               1. No esophageal varices                           2. Mild portal hypertensive gastropathy                           3. Otherwise unremarkable EGD. Recommendation:           -  Patient has a contact number available for                            emergencies. The signs and symptoms of potential                            delayed complications were discussed with the                            patient. Return to normal activities tomorrow.                            Written discharge instructions were provided to the                            patient.                           - Resume previous diet.                           - Continue present medications.                           ?" Resume Eliquis today                           ?" Return to the care of your primary provider Wilhemina Bonito. Marina Goodell, MD 11/01/2023 9:39:49 AM This report has been signed electronically.

## 2023-11-01 NOTE — Patient Instructions (Signed)
Please read handouts provided. Continue present medications. Await pathology results. Resume Eliquis today. Return to care of primary provider. Resume previous diet.   YOU HAD AN ENDOSCOPIC PROCEDURE TODAY AT THE [facility]:   Refer to the procedure report that was given to you for any specific questions about what was found during the examination.  If the procedure report does not answer your questions, please call your gastroenterologist to clarify.  If you requested that your care partner not be given the details of your procedure findings, then the procedure report has been included in a sealed envelope for you to review at your convenience later.  YOU SHOULD EXPECT: Some feelings of bloating in the abdomen. Passage of more gas than usual.  Walking can help get rid of the air that was put into your GI tract during the procedure and reduce the bloating. If you had a lower endoscopy (such as a colonoscopy or flexible sigmoidoscopy) you may notice spotting of blood in your stool or on the toilet paper. If you underwent a bowel prep for your procedure, you may not have a normal bowel movement for a few days.  Please Note:  You might notice some irritation and congestion in your nose or some drainage.  This is from the oxygen used during your procedure.  There is no need for concern and it should clear up in a day or so.  SYMPTOMS TO REPORT IMMEDIATELY:  Following lower endoscopy (colonoscopy or flexible sigmoidoscopy):  Excessive amounts of blood in the stool  Significant tenderness or worsening of abdominal pains  Swelling of the abdomen that is new, acute  Fever of 100F or higher  Following upper endoscopy (EGD)  Vomiting of blood or coffee ground material  New chest pain or pain under the shoulder blades  Painful or persistently difficult swallowing  New shortness of breath  Fever of 100F or higher  Black, tarry-looking stools  For urgent or emergent issues, a  gastroenterologist can be reached at any hour by calling [phone]. Do not use MyChart messaging for urgent concerns.    DIET:  We do recommend a small meal at first, but then you may proceed to your regular diet.  Drink plenty of fluids but you should avoid alcoholic beverages for 24 hours.  ACTIVITY:  You should plan to take it easy for the rest of today and you should NOT DRIVE or use heavy machinery until tomorrow (because of the sedation medicines used during the test).    FOLLOW UP: Our staff will call the number listed on your records the next business day following your procedure.  We will call around 7:15- 8:00 am to check on you and address any questions or concerns that you may have regarding the information given to you following your procedure. If we do not reach you, we will leave a message.     If any biopsies were taken you will be contacted by phone or by letter within the next 1-3 weeks.  Please call us at [phone] if you have not heard about the biopsies in 3 weeks.    SIGNATURES/CONFIDENTIALITY: You and/or your care partner have signed paperwork which will be entered into your electronic medical record.  These signatures attest to the fact that that the information above on your After Visit Summary has been reviewed and is understood.  Full responsibility of the confidentiality of this discharge information lies with you and/or your care-partner.

## 2023-11-01 NOTE — Progress Notes (Signed)
Pt's states no medical or surgical changes since previsit or office visit.

## 2023-11-02 NOTE — Telephone Encounter (Signed)
Post procedure follow up call placed, no answer and left VM.

## 2023-11-03 ENCOUNTER — Encounter: Payer: Self-pay | Admitting: Internal Medicine

## 2023-11-03 LAB — SURGICAL PATHOLOGY

## 2023-11-03 NOTE — Telephone Encounter (Signed)
PT returned call. Advised that he is feeling great after having procedure.

## 2023-11-07 ENCOUNTER — Ambulatory Visit: Payer: PPO | Admitting: Orthopedic Surgery

## 2023-11-07 DIAGNOSIS — L97521 Non-pressure chronic ulcer of other part of left foot limited to breakdown of skin: Secondary | ICD-10-CM | POA: Diagnosis not present

## 2023-11-08 ENCOUNTER — Encounter: Payer: Self-pay | Admitting: Orthopedic Surgery

## 2023-11-08 NOTE — Progress Notes (Signed)
 Office Visit Note   Patient: Dan [name]           Date of Birth: [birth year]           MRN: [id number] Visit Date: 11/07/2023              Requested by: Shepard Ade, MD [address] [place],  [place] [address] PCP: Shepard Ade, MD  Chief Complaint  Patient presents with   Left Foot - Follow-up      HPI: Patient is a 73 year old gentleman who presents in follow-up for Wagner grade 1 ulcer first metatarsal head left foot.  Patient is currently in a postoperative shoe with a new felt relieving donut.  Assessment & Plan: Visit Diagnoses:  1. Skin ulcer of left great toe, limited to breakdown of skin (HCC)     Plan: The ulcer continues to heal he was provided more felt relieving donuts to unload pressure.  Follow-Up Instructions: Return in about 4 weeks (around 12/05/2023).   Ortho Exam  Patient is alert, oriented, no adenopathy, well-dressed, normal affect, normal respiratory effort. Examination of the left foot there is no cellulitis odor or drainage.  After informed consent a 10 blade knife was used to debride the skin and soft tissue back to healthy viable flat tissue.  There is no undermining no tunneling.  Ulcer is 3 cm in diameter 1 mm deep after debridement.  Silver nitrate was used hemostasis dry dressing was applied.  Imaging: No results found. No images are attached to the encounter.  Labs: Lab Results  Component Value Date   CRP <1.0 12/27/2022   LABURIC 6.5 07/31/2023   REPTSTATUS 11/08/2014 FINAL 11/02/2014   CULT  11/02/2014    NO GROWTH 5 DAYS Performed at [facility]      Lab Results  Component Value Date   ALBUMIN 4.3 12/27/2022   ALBUMIN 4.5 12/02/2016   ALBUMIN 3.4 (L) 11/03/2014    No results found for: MG No results found for: VD25OH  No results found for: PREALBUMIN    Latest Ref Rng & Units 07/24/2023    4:36 PM 12/27/2022    7:29 AM 12/02/2016   10:21 AM  CBC EXTENDED  WBC 3.8 - 10.8 Thousand/uL  10.8  7.2  8.0   RBC 4.20 - 5.80 Million/uL 5.81  4.97  5.27   Hemoglobin 13.2 - 17.1 g/dL 82.7  84.7  84.9   HCT 38.5 - 50.0 % 52.6  44.9  45.5   Platelets 140 - 400 Thousand/uL 223  195.0  224.0   NEUT# 1,500 - 7,800 cells/uL 7,517  4.3  6.0   Lymph# 0.7 - 4.0 K/uL  1.7  1.0      There is no height or weight on file to calculate BMI.  Orders:  No orders of the defined types were placed in this encounter.  No orders of the defined types were placed in this encounter.    Procedures: No procedures performed  Clinical Data: No additional findings.  ROS:  All other systems negative, except as noted in the HPI. Review of Systems  Objective: Vital Signs: There were no vitals taken for this visit.  Specialty Comments:  No specialty comments available.  PMFS History: Patient Active Problem List   Diagnosis Date Noted   Tinnitus, bilateral 08/08/2018   Lumbar pain 02/23/2018   Degeneration of lumbar intervertebral disc 02/23/2018   Increased body mass index 02/23/2018   History of total knee arthroplasty 10/24/2017  Osteoarthritis 10/24/2017   Acute recurrent pansinusitis 09/13/2016   Chronic rhinitis 09/13/2016   Obstructive sleep apnea 09/13/2016   Primary osteoarthritis of left knee 04/30/2015   Left knee DJD 04/30/2015   Pneumonia 11/02/2014   Hypertension    Diabetes mellitus (HCC)    Hyperlipidemia    OTHER AND UNSPECIFIED COAGULATION DEFECTS 09/28/2010   Chronic a-fib (HCC) 09/28/2010   History of colonic polyps 09/28/2010   Past Medical History:  Diagnosis Date   Arthritis    osteoarthritis left knee   Atrial fibrillation (HCC)    BPH (benign prostatic hypertrophy)    Cardiac arrhythmia due to congenital heart disease    Cholelithiasis    Chronic atrial fibrillation (HCC)    Colon polyps    adenomatous   Diabetes mellitus    Diverticulosis    Dysrhythmia    tx. Pradaxa - Dr. Jordan follows   Elevated LFTs    GERD (gastroesophageal reflux  disease)    Hemorrhoids    Hyperlipidemia    Hypertension    Morbid obesity (HCC)    Sleep apnea    CPAP nightly   Tubular adenoma of colon     Family History  Problem Relation Age of Onset   Colon cancer Mother    Heart attack Father    Heart disease Father    Breast cancer Sister    Stomach cancer Neg Hx    Esophageal cancer Neg Hx     Past Surgical History:  Procedure Laterality Date   KNEE ARTHROSCOPY Left 03/28/2014   Procedure: LEFT KNEE ARTHROSCOPY WITH DEBRIDEMENT  ;  Surgeon: Reyes JAYSON Billing, MD;  Location: [facility];  Service: Orthopedics;  Laterality: Left;   TONSILLECTOMY     TOTAL KNEE ARTHROPLASTY Left 04/30/2015   Procedure: LEFT TOTAL KNEE ARTHROPLASTY;  Surgeon: Reyes Billing, MD;  Location: [facility];  Service: Orthopedics;  Laterality: Left;   US  ECHOCARDIOGRAPHY  08/08/2006   ef 55-60%   Social History   Occupational History   Occupation: VP Insurance Account Manager: [employer]   Occupation: airline pilot  Tobacco Use   Smoking status: Former    Current packs/day: 0.00    Types: Cigarettes    Quit date: 06/20/1991    Years since quitting: 32.4   Smokeless tobacco: Former  Building Services Engineer status: Never Used  Substance and Sexual Activity   Alcohol  use: Yes    Alcohol /week: 0.0 standard drinks of alcohol     Comment: social   Drug use: No   Sexual activity: Yes

## 2023-12-05 ENCOUNTER — Ambulatory Visit: Payer: PPO | Admitting: Orthopedic Surgery

## 2023-12-05 DIAGNOSIS — L97521 Non-pressure chronic ulcer of other part of left foot limited to breakdown of skin: Secondary | ICD-10-CM | POA: Diagnosis not present

## 2023-12-09 ENCOUNTER — Encounter: Payer: Self-pay | Admitting: Orthopedic Surgery

## 2023-12-09 NOTE — Progress Notes (Signed)
 Office Visit Note   Patient: Dan [name]           Date of Birth: 1951-02-06           MRN: [id number] Visit Date: 12/05/2023              Requested by: Geoffry Paradise, MD [address] [place],  [place] [address] PCP: Geoffry Paradise, MD  Chief Complaint  Patient presents with   Left Foot - Follow-up      HPI: Patient is a 73 year old gentleman who presents in follow-up for [facility] grade 1 ulcer left foot first metatarsal head.  Patient has been using Silvadene dressing changes and a felt relieving donut with a postoperative shoe.  Assessment & Plan: Visit Diagnoses:  1. Skin ulcer of left great toe, limited to breakdown of skin (HCC)     Plan: Will place a new felt relieving donut.  Ulcer debrided.   Follow-Up Instructions: Return in about 4 weeks (around 01/02/2024).   Ortho Exam  Patient is alert, oriented, no adenopathy, well-dressed, normal affect, normal respiratory effort. Examination of the left foot patient has a palpable pulse there is no ascending cellulitis.  Patient has a Wagner grade 1 ulcer beneath the left foot first metatarsal head.  After informed consent a 10 blade knife was used to debride the skin and soft tissue back to healthy viable granulation tissue.  The ulcer is 1 cm diameter and 1 mm deep after debridement.  No exposed bone or tendon no tunneling.  Imaging: No results found. No images are attached to the encounter.  Labs: Lab Results  Component Value Date   CRP <1.0 12/27/2022   LABURIC 6.5 07/31/2023   REPTSTATUS 11/08/2014 FINAL 11/02/2014   CULT  11/02/2014    NO GROWTH 5 DAYS Performed at [facility]      Lab Results  Component Value Date   ALBUMIN 4.3 12/27/2022   ALBUMIN 4.5 12/02/2016   ALBUMIN 3.4 (L) 11/03/2014    No results found for: "MG" No results found for: "VD25OH"  No results found for: "PREALBUMIN"    Latest Ref Rng & Units 07/24/2023    4:36 PM 12/27/2022    7:29 AM 12/02/2016   10:21  AM  CBC EXTENDED  WBC 3.8 - 10.8 Thousand/uL 10.8  7.2  8.0   RBC 4.20 - 5.80 Million/uL 5.81  4.97  5.27   Hemoglobin 13.2 - 17.1 g/dL 62.1  30.8  65.7   HCT 38.5 - 50.0 % 52.6  44.9  45.5   Platelets 140 - 400 Thousand/uL 223  195.0  224.0   NEUT# 1,500 - 7,800 cells/uL 7,517  4.3  6.0   Lymph# 0.7 - 4.0 K/uL  1.7  1.0      There is no height or weight on file to calculate BMI.  Orders:  No orders of the defined types were placed in this encounter.  No orders of the defined types were placed in this encounter.    Procedures: No procedures performed  Clinical Data: No additional findings.  ROS:  All other systems negative, except as noted in the HPI. Review of Systems  Objective: Vital Signs: There were no vitals taken for this visit.  Specialty Comments:  No specialty comments available.  PMFS History: Patient Active Problem List   Diagnosis Date Noted   Tinnitus, bilateral 08/08/2018   Lumbar pain 02/23/2018   Degeneration of lumbar intervertebral disc 02/23/2018   Increased body mass index 02/23/2018  History of total knee arthroplasty 10/24/2017   Osteoarthritis 10/24/2017   Acute recurrent pansinusitis 09/13/2016   Chronic rhinitis 09/13/2016   Obstructive sleep apnea 09/13/2016   Primary osteoarthritis of left knee 04/30/2015   Left knee DJD 04/30/2015   Pneumonia 11/02/2014   Hypertension    Diabetes mellitus (HCC)    Hyperlipidemia    OTHER AND UNSPECIFIED COAGULATION DEFECTS 09/28/2010   Chronic a-fib (HCC) 09/28/2010   History of colonic polyps 09/28/2010   Past Medical History:  Diagnosis Date   Arthritis    osteoarthritis left knee   Atrial fibrillation (HCC)    BPH (benign prostatic hypertrophy)    Cardiac arrhythmia due to congenital heart disease    Cholelithiasis    Chronic atrial fibrillation (HCC)    Colon polyps    adenomatous   Diabetes mellitus    Diverticulosis    Dysrhythmia    tx. Pradaxa- Dr. [place] follows    Elevated LFTs    GERD (gastroesophageal reflux disease)    Hemorrhoids    Hyperlipidemia    Hypertension    Morbid obesity (HCC)    Sleep apnea    CPAP nightly   Tubular adenoma of colon     Family History  Problem Relation Age of Onset   Colon cancer Mother    Heart attack Father    Heart disease Father    Breast cancer Sister    Stomach cancer Neg Hx    Esophageal cancer Neg Hx     Past Surgical History:  Procedure Laterality Date   KNEE ARTHROSCOPY Left 03/28/2014   Procedure: LEFT KNEE ARTHROSCOPY WITH DEBRIDEMENT  ;  Surgeon: Javier Docker, MD;  Location: [facility];  Service: Orthopedics;  Laterality: Left;   TONSILLECTOMY     TOTAL KNEE ARTHROPLASTY Left 04/30/2015   Procedure: LEFT TOTAL KNEE ARTHROPLASTY;  Surgeon: Jene Every, MD;  Location: [facility];  Service: Orthopedics;  Laterality: Left;   US ECHOCARDIOGRAPHY  08/08/2006   ef 55-60%   Social History   Occupational History   Occupation: VP Insurance account manager: [employer]   Occupation: Airline pilot  Tobacco Use   Smoking status: Former    Current packs/day: 0.00    Types: Cigarettes    Quit date: 06/20/1991    Years since quitting: 32.4   Smokeless tobacco: Former  Building services engineer status: Never Used  Substance and Sexual Activity   Alcohol use: Yes    Alcohol/week: 0.0 standard drinks of alcohol    Comment: social   Drug use: No   Sexual activity: Yes

## 2023-12-13 DIAGNOSIS — Z7901 Long term (current) use of anticoagulants: Secondary | ICD-10-CM | POA: Diagnosis not present

## 2023-12-13 DIAGNOSIS — E669 Obesity, unspecified: Secondary | ICD-10-CM | POA: Diagnosis not present

## 2023-12-13 DIAGNOSIS — E1169 Type 2 diabetes mellitus with other specified complication: Secondary | ICD-10-CM | POA: Diagnosis not present

## 2023-12-13 DIAGNOSIS — D6869 Other thrombophilia: Secondary | ICD-10-CM | POA: Diagnosis not present

## 2023-12-13 DIAGNOSIS — N1831 Chronic kidney disease, stage 3a: Secondary | ICD-10-CM | POA: Diagnosis not present

## 2023-12-13 DIAGNOSIS — G4733 Obstructive sleep apnea (adult) (pediatric): Secondary | ICD-10-CM | POA: Diagnosis not present

## 2023-12-13 DIAGNOSIS — I480 Paroxysmal atrial fibrillation: Secondary | ICD-10-CM | POA: Diagnosis not present

## 2023-12-13 DIAGNOSIS — I10 Essential (primary) hypertension: Secondary | ICD-10-CM | POA: Diagnosis not present

## 2023-12-26 DIAGNOSIS — G4733 Obstructive sleep apnea (adult) (pediatric): Secondary | ICD-10-CM | POA: Diagnosis not present

## 2024-01-04 ENCOUNTER — Ambulatory Visit: Admitting: Orthopedic Surgery

## 2024-01-04 DIAGNOSIS — L97521 Non-pressure chronic ulcer of other part of left foot limited to breakdown of skin: Secondary | ICD-10-CM | POA: Diagnosis not present

## 2024-01-08 ENCOUNTER — Encounter: Payer: Self-pay | Admitting: Orthopedic Surgery

## 2024-01-08 NOTE — Progress Notes (Signed)
 Office Visit Note   Patient: Dan [name]           Date of Birth: 03-08-1951           MRN: [id number] Visit Date: 01/04/2024              Requested by: Geoffry Paradise, MD [address] [place],  [place] [address] PCP: Geoffry Paradise, MD  Chief Complaint  Patient presents with   Left Foot - Wound Check      HPI: Patient is a 73 year old gentleman presents in follow-up for [facility] grade 1 ulcer left foot first metatarsal head.  Patient is currently using a postoperative shoe and felt relieving donuts.  Assessment & Plan: Visit Diagnoses:  1. Skin ulcer of left great toe, limited to breakdown of skin (HCC)     Plan: Patient is provided with new felt relieving donuts.  Ulcer was debrided.  Follow-Up Instructions: Return in about 4 weeks (around 02/01/2024).   Ortho Exam  Patient is alert, oriented, no adenopathy, well-dressed, normal affect, normal respiratory effort. Examination patient has no redness or cellulitis in the left foot he has a palpable pulse.  He has a Wagner grade 1 ulcer beneath the left first metatarsal head.  There is no sausage digit swelling.  After informed consent a 10 blade knife was used to debride the skin and soft tissue back to healthy viable granulation tissue.  The ulcer measures 2 x 3 cm after debridement  Imaging: No results found. No images are attached to the encounter.  Labs: Lab Results  Component Value Date   CRP <1.0 12/27/2022   LABURIC 6.5 07/31/2023   REPTSTATUS 11/08/2014 FINAL 11/02/2014   CULT  11/02/2014    NO GROWTH 5 DAYS Performed at [facility]      Lab Results  Component Value Date   ALBUMIN 4.3 12/27/2022   ALBUMIN 4.5 12/02/2016   ALBUMIN 3.4 (L) 11/03/2014    No results found for: "MG" No results found for: "VD25OH"  No results found for: "PREALBUMIN"    Latest Ref Rng & Units 07/24/2023    4:36 PM 12/27/2022    7:29 AM 12/02/2016   10:21 AM  CBC EXTENDED  WBC 3.8 - 10.8  Thousand/uL 10.8  7.2  8.0   RBC 4.20 - 5.80 Million/uL 5.81  4.97  5.27   Hemoglobin 13.2 - 17.1 g/dL 21.3  08.6  57.8   HCT 38.5 - 50.0 % 52.6  44.9  45.5   Platelets 140 - 400 Thousand/uL 223  195.0  224.0   NEUT# 1,500 - 7,800 cells/uL 7,517  4.3  6.0   Lymph# 0.7 - 4.0 K/uL  1.7  1.0      There is no height or weight on file to calculate BMI.  Orders:  No orders of the defined types were placed in this encounter.  No orders of the defined types were placed in this encounter.    Procedures: No procedures performed  Clinical Data: No additional findings.  ROS:  All other systems negative, except as noted in the HPI. Review of Systems  Objective: Vital Signs: There were no vitals taken for this visit.  Specialty Comments:  No specialty comments available.  PMFS History: Patient Active Problem List   Diagnosis Date Noted   Tinnitus, bilateral 08/08/2018   Lumbar pain 02/23/2018   Degeneration of lumbar intervertebral disc 02/23/2018   Increased body mass index 02/23/2018   History of total knee arthroplasty 10/24/2017  Osteoarthritis 10/24/2017   Acute recurrent pansinusitis 09/13/2016   Chronic rhinitis 09/13/2016   Obstructive sleep apnea 09/13/2016   Primary osteoarthritis of left knee 04/30/2015   Left knee DJD 04/30/2015   Pneumonia 11/02/2014   Hypertension    Diabetes mellitus (HCC)    Hyperlipidemia    OTHER AND UNSPECIFIED COAGULATION DEFECTS 09/28/2010   Chronic a-fib (HCC) 09/28/2010   History of colonic polyps 09/28/2010   Past Medical History:  Diagnosis Date   Arthritis    osteoarthritis left knee   Atrial fibrillation (HCC)    BPH (benign prostatic hypertrophy)    Cardiac arrhythmia due to congenital heart disease    Cholelithiasis    Chronic atrial fibrillation (HCC)    Colon polyps    adenomatous   Diabetes mellitus    Diverticulosis    Dysrhythmia    tx. Pradaxa- Dr. [place] follows   Elevated LFTs    GERD (gastroesophageal  reflux disease)    Hemorrhoids    Hyperlipidemia    Hypertension    Morbid obesity (HCC)    Sleep apnea    CPAP nightly   Tubular adenoma of colon     Family History  Problem Relation Age of Onset   Colon cancer Mother    Heart attack Father    Heart disease Father    Breast cancer Sister    Stomach cancer Neg Hx    Esophageal cancer Neg Hx     Past Surgical History:  Procedure Laterality Date   KNEE ARTHROSCOPY Left 03/28/2014   Procedure: LEFT KNEE ARTHROSCOPY WITH DEBRIDEMENT  ;  Surgeon: Javier Docker, MD;  Location: [facility];  Service: Orthopedics;  Laterality: Left;   TONSILLECTOMY     TOTAL KNEE ARTHROPLASTY Left 04/30/2015   Procedure: LEFT TOTAL KNEE ARTHROPLASTY;  Surgeon: Jene Every, MD;  Location: [facility];  Service: Orthopedics;  Laterality: Left;   US ECHOCARDIOGRAPHY  08/08/2006   ef 55-60%   Social History   Occupational History   Occupation: VP Insurance account manager: [employer]   Occupation: Airline pilot  Tobacco Use   Smoking status: Former    Current packs/day: 0.00    Types: Cigarettes    Quit date: 06/20/1991    Years since quitting: 32.5   Smokeless tobacco: Former  Building services engineer status: Never Used  Substance and Sexual Activity   Alcohol use: Yes    Alcohol/week: 0.0 standard drinks of alcohol    Comment: social   Drug use: No   Sexual activity: Yes

## 2024-02-01 ENCOUNTER — Ambulatory Visit: Admitting: Orthopedic Surgery

## 2024-02-01 DIAGNOSIS — L97521 Non-pressure chronic ulcer of other part of left foot limited to breakdown of skin: Secondary | ICD-10-CM

## 2024-02-02 ENCOUNTER — Encounter: Payer: Self-pay | Admitting: Orthopedic Surgery

## 2024-02-02 ENCOUNTER — Other Ambulatory Visit: Payer: Self-pay | Admitting: Cardiology

## 2024-02-02 NOTE — Progress Notes (Signed)
 Office Visit Note   Patient: Dan [name]           Date of Birth: 03/04/1951           MRN: [id number] Visit Date: 02/01/2024              Requested by: Suan Elm, MD [address] [place],  [place] [address] PCP: Suan Elm, MD  Chief Complaint  Patient presents with   Left Foot - Follow-up      HPI: Patient is a 73 year old gentleman who presents in follow-up for [facility] grade 1 ulcer beneath the great toe left foot.  Assessment & Plan: Visit Diagnoses:  1. Skin ulcer of left great toe, limited to breakdown of skin (HCC)     Plan: Ulcer was debrided a pressure relieving donut was placed under his orthotic.  Follow-Up Instructions: No follow-ups on file.   Ortho Exam  Patient is alert, oriented, no adenopathy, well-dressed, normal affect, normal respiratory effort. Examination patient has no redness or cellulitis.  The plantar ulcer beneath the left great toe MTP joint is improving.  After informed consent a 10 Dan knife was used to debride the skin and soft tissue back to healthy viable granulation tissue.  There is no tunneling no exposed bone or tendon.  After debridement the ulcer is 2 x 2 cm.  Imaging: No results found. No images are attached to the encounter.  Labs: Lab Results  Component Value Date   CRP <1.0 12/27/2022   LABURIC 6.5 07/31/2023   REPTSTATUS 11/08/2014 FINAL 11/02/2014   CULT  11/02/2014    NO GROWTH 5 DAYS Performed at [facility]      Lab Results  Component Value Date   ALBUMIN 4.3 12/27/2022   ALBUMIN 4.5 12/02/2016   ALBUMIN 3.4 (L) 11/03/2014    No results found for: "MG" No results found for: "VD25OH"  No results found for: "PREALBUMIN"    Latest Ref Rng & Units 07/24/2023    4:36 PM 12/27/2022    7:29 AM 12/02/2016   10:21 AM  CBC EXTENDED  WBC 3.8 - 10.8 Thousand/uL 10.8  7.2  8.0   RBC 4.20 - 5.80 Million/uL 5.81  4.97  5.27   Hemoglobin 13.2 - 17.1 g/dL 78.4  69.6  29.5   HCT  38.5 - 50.0 % 52.6  44.9  45.5   Platelets 140 - 400 Thousand/uL 223  195.0  224.0   NEUT# 1,500 - 7,800 cells/uL 7,517  4.3  6.0   Lymph# 0.7 - 4.0 K/uL  1.7  1.0      There is no height or weight on file to calculate BMI.  Orders:  No orders of the defined types were placed in this encounter.  No orders of the defined types were placed in this encounter.    Procedures: No procedures performed  Clinical Data: No additional findings.  ROS:  All other systems negative, except as noted in the HPI. Review of Systems  Objective: Vital Signs: There were no vitals taken for this visit.  Specialty Comments:  No specialty comments available.  PMFS History: Patient Active Problem List   Diagnosis Date Noted   Tinnitus, bilateral 08/08/2018   Lumbar pain 02/23/2018   Degeneration of lumbar intervertebral disc 02/23/2018   Increased body mass index 02/23/2018   History of total knee arthroplasty 10/24/2017   Osteoarthritis 10/24/2017   Acute recurrent pansinusitis 09/13/2016   Chronic rhinitis 09/13/2016   Obstructive sleep apnea 09/13/2016  Primary osteoarthritis of left knee 04/30/2015   Left knee DJD 04/30/2015   Pneumonia 11/02/2014   Hypertension    Diabetes mellitus (HCC)    Hyperlipidemia    OTHER AND UNSPECIFIED COAGULATION DEFECTS 09/28/2010   Chronic a-fib (HCC) 09/28/2010   History of colonic polyps 09/28/2010   Past Medical History:  Diagnosis Date   Arthritis    osteoarthritis left knee   Atrial fibrillation (HCC)    BPH (benign prostatic hypertrophy)    Cardiac arrhythmia due to congenital heart disease    Cholelithiasis    Chronic atrial fibrillation (HCC)    Colon polyps    adenomatous   Diabetes mellitus    Diverticulosis    Dysrhythmia    tx. Pradaxa - Dr. [place] follows   Elevated LFTs    GERD (gastroesophageal reflux disease)    Hemorrhoids    Hyperlipidemia    Hypertension    Morbid obesity (HCC)    Sleep apnea    CPAP nightly    Tubular adenoma of colon     Family History  Problem Relation Age of Onset   Colon cancer Mother    Heart attack Father    Heart disease Father    Breast cancer Sister    Stomach cancer Neg Hx    Esophageal cancer Neg Hx     Past Surgical History:  Procedure Laterality Date   KNEE ARTHROSCOPY Left 03/28/2014   Procedure: LEFT KNEE ARTHROSCOPY WITH DEBRIDEMENT  ;  Surgeon: Loel Ring, MD;  Location: [facility] ;  Service: Orthopedics;  Laterality: Left;   TONSILLECTOMY     TOTAL KNEE ARTHROPLASTY Left 04/30/2015   Procedure: LEFT TOTAL KNEE ARTHROPLASTY;  Surgeon: Orvan Blanch, MD;  Location: [facility];  Service: Orthopedics;  Laterality: Left;   US  ECHOCARDIOGRAPHY  08/08/2006   ef 55-60%   Social History   Occupational History   Occupation: VP Insurance account manager: [employer]   Occupation: Airline pilot  Tobacco Use   Smoking status: Former    Current packs/day: 0.00    Types: Cigarettes    Quit date: 06/20/1991    Years since quitting: 32.6   Smokeless tobacco: Former  Building services engineer status: Never Used  Substance and Sexual Activity   Alcohol  use: Yes    Alcohol /week: 0.0 standard drinks of alcohol     Comment: social   Drug use: No   Sexual activity: Yes

## 2024-02-15 DIAGNOSIS — N1831 Chronic kidney disease, stage 3a: Secondary | ICD-10-CM | POA: Diagnosis not present

## 2024-02-15 DIAGNOSIS — E669 Obesity, unspecified: Secondary | ICD-10-CM | POA: Diagnosis not present

## 2024-02-15 DIAGNOSIS — I10 Essential (primary) hypertension: Secondary | ICD-10-CM | POA: Diagnosis not present

## 2024-02-15 DIAGNOSIS — E785 Hyperlipidemia, unspecified: Secondary | ICD-10-CM | POA: Diagnosis not present

## 2024-02-15 DIAGNOSIS — E1169 Type 2 diabetes mellitus with other specified complication: Secondary | ICD-10-CM | POA: Diagnosis not present

## 2024-02-19 NOTE — Progress Notes (Signed)
 Dan Dan [name] Date of Birth: 12/15/1950   History of Present Illness Dan [name] is seen today for follow up of atrial fibrillation. He has a history of chronic atrial fibrillation that has been managed with rate control and anticoagulation. Stress Myoview in August 2014 which was low risk with a small fixed apical defect. No ischemia. He also has a history of HTN, DM, obesity, and HLD. 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 There was incidental findings of gallstones. The liver was nodular in appearance c/w cirrhosis. Subsequent CT showed evidence of cirrhosis. He did have normal EGD in February 2019 without varices and colonoscopy showed some polyps and tics.   On follow up today he is doing well. He is walking more. Has lost 16 lbs in last 6 months.  Is still unaware of his Afib. No chest pain, dyspnea, palpitations.He notes BP at home is excellent. He is eating much healthier. No bleeding issues on Eliquis .  He is still working but not traveling as much.   Current Outpatient Medications on File Prior to Visit  Medication Sig Dispense Refill   amLODipine  (NORVASC ) 10 MG tablet Take 10 mg by mouth every morning.     colchicine  0.6 MG tablet Take 1 tablet (0.6 mg total) by mouth daily. 90 tablet 3   Continuous Glucose Sensor (FREESTYLE LIBRE 3 SENSOR) MISC Left arm     ELIQUIS  5 MG TABS tablet TAKE 1 TABLET BY MOUTH TWICE DAILY 180 tablet 1   ezetimibe  (ZETIA ) 10 MG tablet Take 10 mg by mouth daily.     Febuxostat (ULORIC) 80 MG TABS 80 mg daily in the afternoon.     fluticasone  (FLONASE ) 50 MCG/ACT nasal spray Place into the nose.     furosemide  (LASIX ) 20 MG tablet Take 20 mg by mouth daily.     hydrALAZINE  (APRESOLINE ) 50 MG tablet TAKE 1 TABLET(50 MG) BY MOUTH TWICE DAILY 180 tablet 2   JARDIANCE 25 MG TABS tablet Take 25 mg by mouth daily.     lisinopril  (ZESTRIL ) 40 MG tablet Take 1 tablet (40  mg total) by mouth daily. 90 tablet 4   metFORMIN (GLUCOPHAGE) 1000 MG tablet Take 1 tablet by mouth 2 (two) times daily with a meal.      omeprazole  (PRILOSEC) 40 MG capsule Take 1 capsule (40 mg total) by mouth daily. 90 capsule 3   Semaglutide, 2 MG/DOSE, (OZEMPIC, 2 MG/DOSE,) 8 MG/3ML SOPN Inject 2 mg into the skin once a week.     simvastatin  (ZOCOR ) 20 MG tablet Take 20 mg by mouth daily.     spironolactone  (ALDACTONE ) 25 MG tablet TAKE 1 TABLET(25 MG) BY MOUTH DAILY 90 tablet 0   No current facility-administered medications on file prior to visit.    Allergies  Allergen Reactions   Macadamia Nut Oil Shortness Of Breath    Past Medical History:  Diagnosis Date   Arthritis    osteoarthritis left knee   Atrial fibrillation (HCC)    BPH (benign prostatic hypertrophy)    Cardiac arrhythmia due to congenital heart disease    Cholelithiasis    Chronic atrial fibrillation (HCC)    Colon polyps    adenomatous   Diabetes mellitus    Diverticulosis    Dysrhythmia    tx. Pradaxa - Dr. [place] follows   Elevated LFTs    GERD (gastroesophageal reflux disease)    Hemorrhoids  Hyperlipidemia    Hypertension    Morbid obesity (HCC)    Sleep apnea    CPAP nightly   Tubular adenoma of colon     Past Surgical History:  Procedure Laterality Date   KNEE ARTHROSCOPY Left 03/28/2014   Procedure: LEFT KNEE ARTHROSCOPY WITH DEBRIDEMENT  ;  Surgeon: Loel Ring, MD;  Location: [facility];  Service: Orthopedics;  Laterality: Left;   TONSILLECTOMY     TOTAL KNEE ARTHROPLASTY Left 04/30/2015   Procedure: LEFT TOTAL KNEE ARTHROPLASTY;  Surgeon: Orvan Blanch, MD;  Location: [facility];  Service: Orthopedics;  Laterality: Left;   US  ECHOCARDIOGRAPHY  08/08/2006   ef 55-60%    Social History   Tobacco Use  Smoking Status Former   Current packs/day: 0.00   Types: Cigarettes   Quit date: 06/20/1991   Years since quitting: 32.6  Smokeless Tobacco Former    Social History    Substance and Sexual Activity  Alcohol  Use Yes   Alcohol /week: 0.0 standard drinks of alcohol    Comment: social    Family History  Problem Relation Age of Onset   Colon cancer Mother    Heart attack Father    Heart disease Father    Breast cancer Sister    Stomach cancer Neg Hx    Esophageal cancer Neg Hx     Review of Systems: As per history of present illness.  All other systems were reviewed and are negative.  Physical Exam: BP 120/74 (BP Location: Right Arm, Patient Position: Sitting, Cuff Size: Normal)   Pulse 80   Ht 6\' 1"  (1.854 m)   Wt 261 lb 9.6 oz (118.7 kg)   SpO2 97%   BMI 34.51 kg/m   GENERAL:  Well appearing, obese WM in NAD HEENT:  PERRL, EOMI, sclera are clear. Oropharynx is clear. NECK:  No jugular venous distention, carotid upstroke brisk and symmetric, no bruits, no thyromegaly or adenopathy LUNGS:  Clear to auscultation bilaterally CHEST:  Unremarkable HEART:  IRRR,  PMI not displaced or sustained,S1 and S2 within normal limits, no S3, no S4: no clicks, no rubs, no murmurs ABD:  Soft, nontender. BS +, no masses or bruits. No hepatomegaly, no splenomegaly EXT:  2 + pulses throughout, no edema, skin around ankles is scaly, no cyanosis no clubbing SKIN:  Warm and dry.  Hyperpigmentation LE. Excellent pulses. NEURO:  Alert and oriented x 3. Cranial nerves II through XII intact. PSYCH:  Cognitively intact    LABORATORY DATA: Lab Results  Component Value Date   WBC 10.8 07/24/2023   HGB 17.2 (H) 07/24/2023   HCT 52.6 (H) 07/24/2023   PLT 223 07/24/2023   GLUCOSE 293 (H) 07/24/2023   ALT 16 07/24/2023   AST 10 07/24/2023   NA 136 07/24/2023   K 5.0 07/24/2023   CL 95 (L) 07/24/2023   CREATININE 1.19 07/24/2023   BUN 38 (H) 07/24/2023   CO2 28 07/24/2023   TSH 3.15 12/27/2022   INR 1.3 (H) 12/02/2016   Labs dated 06/10/16: cholesterol 112, triglycerides 122, HDL 37, LDL 51.  Dated 2.5.18: A1c 7.4%.  Dated 06/15/17: cholesterol 104,  triglycerides 127, HDL 38, LDL 41, CBC, CMET, TSH normal Dated 03/15/18: A1c 5.9%.  Dated 07/27/18: cholesterol 99, triglycerides 68, HDL 46, LDL 39. CMET, CBC, TSH normal Dated 04/01/19 A1c 6.8%.  Dated 07/31/19: cholesterol 126, triglycerides 169, HDL 43, LDL 49. A1c 7.3%. CBC, CMET and TSH normal Dated 08/06/20: A1c 6.6%. cholesterol 114, triglycerides 150, HDL 44,  LDL 40. CMET and TSH normal Dated 08/25/21: cholesterol 108, triglycerides 109, HDL 44, LDL 42. A1c 7.1%. CMET normal Dated 08/31/22: cholesterol 110, triglycerides 149, HDL 40, LDL 40. A1c 7.9%. CMET normal.  Dated 09/06/23: cholesterol 99, triglycerides 187, HDL 40, LDL 22. Creatinine 1.19. otherwise CMET and CBC normal.  Dated 02/15/24: A1c 6.5%  EKG Interpretation Date/Time:  Thursday Feb 22 2024 16:10:96 EDT Ventricular Rate:  80 PR Interval:    QRS Duration:  84 QT Interval:  356 QTC Calculation: 410 R Axis:   21  Text Interpretation: Atrial fibrillation Possible Anteroseptal infarct , age undetermined When compared with ECG of 02-Nov-2014 12:58, Borderline criteria for Anteroseptal infarct are now Present Confirmed by [place], Latona Krichbaum [phone]) on 02/22/2024 8:27:12 AM    Carotid dopplers 01/04/22: Summary:  Right Carotid: Velocities in the right ICA are consistent with a 1-39%  stenosis.                Non-hemodynamically significant plaque <50% noted in the  CCA.   Left Carotid: Velocities in the left ICA are consistent with a 1-39%  stenosis.   Vertebrals: Bilateral vertebral arteries demonstrate antegrade flow.  Subclavians: Normal flow hemodynamics were seen in bilateral subclavian               arteries.    Assessment / Plan:d 1. Permanent atrial fibrillation. Rate is  well controlled.  He is anticoagulated with Eliquis .  He is asymptomatic. We will continue with his current therapy.   2. Low risk Myoview in 2014 without ischemia.  3. Hypertension- resistant. Now well controlled on multiple meds.   4.  Diabetes mellitus type 2-A1c 6.5%. very pleased with his weight loss.   5. Hyperlipidemia on Vytorin . Excellent control. LDL 22  6. Hepatic cirrhosis. Probably related to fatty liver.     Follow up in one year

## 2024-02-22 ENCOUNTER — Ambulatory Visit: Attending: Cardiology | Admitting: Cardiology

## 2024-02-22 ENCOUNTER — Other Ambulatory Visit (HOSPITAL_COMMUNITY): Payer: Self-pay

## 2024-02-22 ENCOUNTER — Other Ambulatory Visit: Payer: Self-pay

## 2024-02-22 VITALS — BP 120/74 | HR 80 | Ht 73.0 in | Wt 261.6 lb

## 2024-02-22 DIAGNOSIS — I482 Chronic atrial fibrillation, unspecified: Secondary | ICD-10-CM

## 2024-02-22 MED ORDER — OMEPRAZOLE 40 MG PO CPDR
40.0000 mg | DELAYED_RELEASE_CAPSULE | Freq: Every day | ORAL | 3 refills | Status: AC
  Filled 2024-02-22 (×2): qty 90, 90d supply, fill #0
  Filled 2024-05-15: qty 90, 90d supply, fill #1

## 2024-02-22 MED ORDER — AMLODIPINE BESYLATE 10 MG PO TABS: 10.0000 mg | ORAL_TABLET | Freq: Every day | ORAL | 3 refills | Status: DC

## 2024-02-22 MED ORDER — EMPAGLIFLOZIN 25 MG PO TABS
25.0000 mg | ORAL_TABLET | Freq: Every day | ORAL | 3 refills | Status: AC
  Filled 2024-02-22: qty 90, 90d supply, fill #0

## 2024-02-22 MED ORDER — OZEMPIC (2 MG/DOSE) 8 MG/3ML ~~LOC~~ SOPN
2.0000 mg | PEN_INJECTOR | SUBCUTANEOUS | 2 refills | Status: DC
  Filled 2024-04-08: qty 3, 28d supply, fill #0
  Filled 2024-06-10: qty 3, 28d supply, fill #1

## 2024-02-22 MED ORDER — EZETIMIBE 10 MG PO TABS: 10.0000 mg | ORAL_TABLET | Freq: Every day | ORAL | 3 refills | Status: DC

## 2024-02-22 MED ORDER — AMLODIPINE BESYLATE 10 MG PO TABS
10.0000 mg | ORAL_TABLET | Freq: Every morning | ORAL | 3 refills | Status: AC
  Filled 2024-02-22 (×2): qty 90, 90d supply, fill #0
  Filled 2024-05-29: qty 90, 90d supply, fill #1
  Filled 2024-08-22: qty 90, 90d supply, fill #2

## 2024-02-22 MED ORDER — COLCHICINE 0.6 MG PO CAPS
0.6000 mg | ORAL_CAPSULE | Freq: Every day | ORAL | 1 refills | Status: DC
  Filled 2024-03-04 – 2024-04-08 (×3): qty 30, 30d supply, fill #0

## 2024-02-22 MED ORDER — FUROSEMIDE 20 MG PO TABS
20.0000 mg | ORAL_TABLET | Freq: Every day | ORAL | 3 refills | Status: AC
  Filled 2024-02-22 – 2024-03-04 (×4): qty 90, 90d supply, fill #0
  Filled 2024-06-13: qty 90, 90d supply, fill #1
  Filled 2024-09-14: qty 90, 90d supply, fill #2

## 2024-02-22 MED ORDER — SIMVASTATIN 20 MG PO TABS
20.0000 mg | ORAL_TABLET | Freq: Every day | ORAL | 3 refills | Status: DC
  Filled 2024-02-22 (×2): qty 90, 90d supply, fill #0

## 2024-02-22 MED ORDER — JARDIANCE 25 MG PO TABS
25.0000 mg | ORAL_TABLET | Freq: Every day | ORAL | 3 refills | Status: AC
  Filled 2024-02-22 – 2024-03-04 (×4): qty 90, 90d supply, fill #0
  Filled 2024-06-21: qty 90, 90d supply, fill #1
  Filled 2024-09-14: qty 90, 90d supply, fill #2

## 2024-02-22 MED ORDER — OZEMPIC (2 MG/DOSE) 8 MG/3ML ~~LOC~~ SOPN
2.0000 mg | PEN_INJECTOR | SUBCUTANEOUS | 3 refills | Status: DC
  Filled 2024-02-24 (×3): qty 3, 28d supply, fill #0
  Filled 2024-03-18: qty 3, 28d supply, fill #1
  Filled 2024-07-04: qty 3, 28d supply, fill #2

## 2024-02-22 MED ORDER — LISINOPRIL 40 MG PO TABS: 40.0000 mg | ORAL_TABLET | Freq: Every day | ORAL | 3 refills | Status: DC

## 2024-02-22 MED ORDER — FREESTYLE LIBRE 3 SENSOR MISC
3 refills | Status: AC
Start: 2024-01-25 — End: ?
  Filled 2024-03-07 (×2): qty 6, 84d supply, fill #0
  Filled 2024-05-17: qty 6, 84d supply, fill #1
  Filled 2024-08-02: qty 6, 84d supply, fill #2

## 2024-02-22 MED ORDER — HYDRALAZINE HCL 50 MG PO TABS: 50.0000 mg | ORAL_TABLET | Freq: Two times a day (BID) | ORAL | 2 refills | Status: DC

## 2024-02-22 MED ORDER — EZETIMIBE 10 MG PO TABS
10.0000 mg | ORAL_TABLET | Freq: Every day | ORAL | 3 refills | Status: AC
  Filled 2024-02-22 – 2024-04-08 (×3): qty 90, 90d supply, fill #0
  Filled 2024-07-08: qty 90, 90d supply, fill #1
  Filled 2024-09-30: qty 90, 90d supply, fill #2

## 2024-02-22 MED ORDER — HYDRALAZINE HCL 50 MG PO TABS
50.0000 mg | ORAL_TABLET | Freq: Two times a day (BID) | ORAL | 3 refills | Status: AC
  Filled 2024-02-22 – 2024-04-08 (×3): qty 180, 90d supply, fill #0
  Filled 2024-07-08: qty 180, 90d supply, fill #1
  Filled 2024-10-14: qty 180, 90d supply, fill #2

## 2024-02-22 MED ORDER — LISINOPRIL 40 MG PO TABS
40.0000 mg | ORAL_TABLET | Freq: Every day | ORAL | 3 refills | Status: AC
  Filled 2024-02-22 (×2): qty 90, 90d supply, fill #0
  Filled 2024-04-29: qty 100, 100d supply, fill #0
  Filled 2024-04-29: qty 90, 90d supply, fill #0
  Filled 2024-08-14: qty 100, 100d supply, fill #1

## 2024-02-22 MED ORDER — SIMVASTATIN 20 MG PO TABS
20.0000 mg | ORAL_TABLET | Freq: Every evening | ORAL | 3 refills | Status: AC
  Filled 2024-04-08: qty 90, 90d supply, fill #0
  Filled 2024-07-08: qty 90, 90d supply, fill #1
  Filled 2024-09-30: qty 90, 90d supply, fill #2

## 2024-02-22 MED ORDER — SPIRONOLACTONE 25 MG PO TABS
25.0000 mg | ORAL_TABLET | Freq: Every day | ORAL | 3 refills | Status: AC
  Filled 2024-02-22 – 2024-05-15 (×3): qty 90, 90d supply, fill #0
  Filled 2024-08-14: qty 90, 90d supply, fill #1

## 2024-02-22 MED ORDER — APIXABAN 5 MG PO TABS
5.0000 mg | ORAL_TABLET | Freq: Two times a day (BID) | ORAL | 3 refills | Status: AC
  Filled 2024-02-22 (×3): qty 180, 90d supply, fill #0
  Filled 2024-06-13: qty 180, 90d supply, fill #1
  Filled 2024-09-14: qty 180, 90d supply, fill #2

## 2024-02-22 MED ORDER — OMEPRAZOLE 40 MG PO CPDR
40.0000 mg | DELAYED_RELEASE_CAPSULE | Freq: Every day | ORAL | 3 refills | Status: AC
  Filled 2024-08-22: qty 90, 90d supply, fill #0

## 2024-02-22 MED ORDER — NEBIVOLOL HCL 20 MG PO TABS
20.0000 mg | ORAL_TABLET | Freq: Two times a day (BID) | ORAL | 1 refills | Status: DC
  Filled 2024-03-04 (×2): qty 180, 90d supply, fill #0

## 2024-02-22 NOTE — Addendum Note (Signed)
 Addended by: Sherryn Donalds on: 02/22/2024 08:50 AM   Modules accepted: Orders

## 2024-02-22 NOTE — Patient Instructions (Signed)

## 2024-02-24 ENCOUNTER — Other Ambulatory Visit (HOSPITAL_COMMUNITY): Payer: Self-pay

## 2024-02-24 ENCOUNTER — Other Ambulatory Visit (HOSPITAL_BASED_OUTPATIENT_CLINIC_OR_DEPARTMENT_OTHER): Payer: Self-pay

## 2024-02-29 ENCOUNTER — Ambulatory Visit: Payer: PPO | Admitting: Cardiology

## 2024-03-04 ENCOUNTER — Other Ambulatory Visit: Payer: Self-pay

## 2024-03-04 ENCOUNTER — Ambulatory Visit: Admitting: Orthopedic Surgery

## 2024-03-04 ENCOUNTER — Other Ambulatory Visit (HOSPITAL_COMMUNITY): Payer: Self-pay

## 2024-03-04 DIAGNOSIS — L97521 Non-pressure chronic ulcer of other part of left foot limited to breakdown of skin: Secondary | ICD-10-CM

## 2024-03-04 MED ORDER — COLCHICINE 0.6 MG PO TABS
0.6000 mg | ORAL_TABLET | Freq: Every day | ORAL | 12 refills | Status: AC
  Filled 2024-03-04: qty 30, 30d supply, fill #0
  Filled 2024-04-18: qty 30, 30d supply, fill #1
  Filled 2024-04-18: qty 90, 90d supply, fill #1
  Filled 2024-07-08: qty 90, 90d supply, fill #2
  Filled 2024-09-30: qty 90, 90d supply, fill #3

## 2024-03-06 ENCOUNTER — Other Ambulatory Visit (HOSPITAL_COMMUNITY): Payer: Self-pay

## 2024-03-06 ENCOUNTER — Other Ambulatory Visit: Payer: Self-pay

## 2024-03-06 DIAGNOSIS — N401 Enlarged prostate with lower urinary tract symptoms: Secondary | ICD-10-CM | POA: Diagnosis not present

## 2024-03-06 DIAGNOSIS — R3912 Poor urinary stream: Secondary | ICD-10-CM | POA: Diagnosis not present

## 2024-03-06 DIAGNOSIS — N5201 Erectile dysfunction due to arterial insufficiency: Secondary | ICD-10-CM | POA: Diagnosis not present

## 2024-03-06 DIAGNOSIS — R3914 Feeling of incomplete bladder emptying: Secondary | ICD-10-CM | POA: Diagnosis not present

## 2024-03-06 MED ORDER — TAMSULOSIN HCL 0.4 MG PO CAPS
0.4000 mg | ORAL_CAPSULE | Freq: Every day | ORAL | 3 refills | Status: DC
  Filled 2024-03-06: qty 90, 90d supply, fill #0

## 2024-03-07 ENCOUNTER — Other Ambulatory Visit: Payer: Self-pay

## 2024-03-07 ENCOUNTER — Other Ambulatory Visit (HOSPITAL_COMMUNITY): Payer: Self-pay

## 2024-03-10 ENCOUNTER — Encounter: Payer: Self-pay | Admitting: Orthopedic Surgery

## 2024-03-10 NOTE — Progress Notes (Signed)
 Office Visit Note   Patient: Dan [name]           Date of Birth: 15-Oct-1950           MRN: [id number] Visit Date: 03/04/2024              Requested by: Suan Elm, MD [address] [place],  [place] [address] PCP: Suan Elm, MD  Chief Complaint  Patient presents with   Left Foot - Wound Check      HPI: Patient is a 73 year old gentleman who presents in follow-up for left great toe ulcer currently using pressure offloading with a felt donut.  Assessment & Plan: Visit Diagnoses:  1. Skin ulcer of left great toe, limited to breakdown of skin (HCC)     Plan: Recommend continuing to stretch the Achilles.  Prescription provided for colchicine .  Ulcer debrided.  Follow-Up Instructions: Return in about 4 weeks (around 04/01/2024).   Ortho Exam  Patient is alert, oriented, no adenopathy, well-dressed, normal affect, normal respiratory effort. Examination patient's left great toe ulcer has fibrinous nonviable tissue.  After informed consent a 10 blade knife was used to debride the skin and soft tissue back to healthy viable tissue.  The ulcer is 2 cm diameter after debridement 3 mm deep.  There is no exposed bone or tendon.  Patient does have Achilles contracture with dorsiflexion the ankle to neutral with the knee extended.    Imaging: No results found. No images are attached to the encounter.  Labs: Lab Results  Component Value Date   CRP <1.0 12/27/2022   LABURIC 6.5 07/31/2023   REPTSTATUS 11/08/2014 FINAL 11/02/2014   CULT  11/02/2014    NO GROWTH 5 DAYS Performed at [facility]      Lab Results  Component Value Date   ALBUMIN 4.3 12/27/2022   ALBUMIN 4.5 12/02/2016   ALBUMIN 3.4 (L) 11/03/2014    No results found for: "MG" No results found for: "VD25OH"  No results found for: "PREALBUMIN"    Latest Ref Rng & Units 07/24/2023    4:36 PM 12/27/2022    7:29 AM 12/02/2016   10:21 AM  CBC EXTENDED  WBC 3.8 - 10.8  Thousand/uL 10.8  7.2  8.0   RBC 4.20 - 5.80 Million/uL 5.81  4.97  5.27   Hemoglobin 13.2 - 17.1 g/dL 19.1  47.8  29.5   HCT 38.5 - 50.0 % 52.6  44.9  45.5   Platelets 140 - 400 Thousand/uL 223  195.0  224.0   NEUT# 1,500 - 7,800 cells/uL 7,517  4.3  6.0   Lymph# 0.7 - 4.0 K/uL  1.7  1.0      There is no height or weight on file to calculate BMI.  Orders:  No orders of the defined types were placed in this encounter.  Meds ordered this encounter  Medications   colchicine  0.6 MG tablet    Sig: Take 1 tablet (0.6 mg total) by mouth daily.    Dispense:  30 tablet    Refill:  12     Procedures: No procedures performed  Clinical Data: No additional findings.  ROS:  All other systems negative, except as noted in the HPI. Review of Systems  Objective: Vital Signs: There were no vitals taken for this visit.  Specialty Comments:  No specialty comments available.  PMFS History: Patient Active Problem List   Diagnosis Date Noted   Tinnitus, bilateral 08/08/2018   Lumbar pain 02/23/2018  Degeneration of lumbar intervertebral disc 02/23/2018   Increased body mass index 02/23/2018   History of total knee arthroplasty 10/24/2017   Osteoarthritis 10/24/2017   Acute recurrent pansinusitis 09/13/2016   Chronic rhinitis 09/13/2016   Obstructive sleep apnea 09/13/2016   Primary osteoarthritis of left knee 04/30/2015   Left knee DJD 04/30/2015   Pneumonia 11/02/2014   Hypertension    Diabetes mellitus (HCC)    Hyperlipidemia    OTHER AND UNSPECIFIED COAGULATION DEFECTS 09/28/2010   Chronic a-fib (HCC) 09/28/2010   History of colonic polyps 09/28/2010   Past Medical History:  Diagnosis Date   Arthritis    osteoarthritis left knee   Atrial fibrillation (HCC)    BPH (benign prostatic hypertrophy)    Cardiac arrhythmia due to congenital heart disease    Cholelithiasis    Chronic atrial fibrillation (HCC)    Colon polyps    adenomatous   Diabetes mellitus     Diverticulosis    Dysrhythmia    tx. Pradaxa - Dr. Swaziland follows   Elevated LFTs    GERD (gastroesophageal reflux disease)    Hemorrhoids    Hyperlipidemia    Hypertension    Morbid obesity (HCC)    Sleep apnea    CPAP nightly   Tubular adenoma of colon     Family History  Problem Relation Age of Onset   Colon cancer Mother    Heart attack Father    Heart disease Father    Breast cancer Sister    Stomach cancer Neg Hx    Esophageal cancer Neg Hx     Past Surgical History:  Procedure Laterality Date   KNEE ARTHROSCOPY Left 03/28/2014   Procedure: LEFT KNEE ARTHROSCOPY WITH DEBRIDEMENT  ;  Surgeon: Loel Ring, MD;  Location: [facility];  Service: Orthopedics;  Laterality: Left;   TONSILLECTOMY     TOTAL KNEE ARTHROPLASTY Left 04/30/2015   Procedure: LEFT TOTAL KNEE ARTHROPLASTY;  Surgeon: Orvan Blanch, MD;  Location: [facility];  Service: Orthopedics;  Laterality: Left;   US  ECHOCARDIOGRAPHY  08/08/2006   ef 55-60%   Social History   Occupational History   Occupation: VP Insurance account manager: [employer]   Occupation: Airline pilot  Tobacco Use   Smoking status: Former    Current packs/day: 0.00    Types: Cigarettes    Quit date: 06/20/1991    Years since quitting: 32.7   Smokeless tobacco: Former  Building services engineer status: Never Used  Substance and Sexual Activity   Alcohol  use: Yes    Alcohol /week: 0.0 standard drinks of alcohol     Comment: social   Drug use: No   Sexual activity: Yes

## 2024-03-18 ENCOUNTER — Other Ambulatory Visit: Payer: Self-pay

## 2024-03-18 ENCOUNTER — Other Ambulatory Visit (HOSPITAL_COMMUNITY): Payer: Self-pay

## 2024-03-18 DIAGNOSIS — G4733 Obstructive sleep apnea (adult) (pediatric): Secondary | ICD-10-CM | POA: Diagnosis not present

## 2024-04-08 ENCOUNTER — Other Ambulatory Visit (HOSPITAL_COMMUNITY): Payer: Self-pay

## 2024-04-08 ENCOUNTER — Other Ambulatory Visit: Payer: Self-pay

## 2024-04-08 MED ORDER — METFORMIN HCL 1000 MG PO TABS
1000.0000 mg | ORAL_TABLET | Freq: Two times a day (BID) | ORAL | 3 refills | Status: AC
  Filled 2024-04-08 (×2): qty 180, 90d supply, fill #0
  Filled 2024-07-08: qty 180, 90d supply, fill #1
  Filled 2024-09-30: qty 180, 90d supply, fill #2

## 2024-04-11 ENCOUNTER — Ambulatory Visit: Admitting: Orthopedic Surgery

## 2024-04-11 DIAGNOSIS — L97521 Non-pressure chronic ulcer of other part of left foot limited to breakdown of skin: Secondary | ICD-10-CM

## 2024-04-13 DIAGNOSIS — E785 Hyperlipidemia, unspecified: Secondary | ICD-10-CM | POA: Diagnosis not present

## 2024-04-13 DIAGNOSIS — I4891 Unspecified atrial fibrillation: Secondary | ICD-10-CM | POA: Diagnosis not present

## 2024-04-13 DIAGNOSIS — I509 Heart failure, unspecified: Secondary | ICD-10-CM | POA: Diagnosis not present

## 2024-04-13 DIAGNOSIS — E1169 Type 2 diabetes mellitus with other specified complication: Secondary | ICD-10-CM | POA: Diagnosis not present

## 2024-04-13 DIAGNOSIS — G4733 Obstructive sleep apnea (adult) (pediatric): Secondary | ICD-10-CM | POA: Diagnosis not present

## 2024-04-13 DIAGNOSIS — E1142 Type 2 diabetes mellitus with diabetic polyneuropathy: Secondary | ICD-10-CM | POA: Diagnosis not present

## 2024-04-13 DIAGNOSIS — I130 Hypertensive heart and chronic kidney disease with heart failure and stage 1 through stage 4 chronic kidney disease, or unspecified chronic kidney disease: Secondary | ICD-10-CM | POA: Diagnosis not present

## 2024-04-13 DIAGNOSIS — E669 Obesity, unspecified: Secondary | ICD-10-CM | POA: Diagnosis not present

## 2024-04-13 DIAGNOSIS — N1831 Chronic kidney disease, stage 3a: Secondary | ICD-10-CM | POA: Diagnosis not present

## 2024-04-13 DIAGNOSIS — E1122 Type 2 diabetes mellitus with diabetic chronic kidney disease: Secondary | ICD-10-CM | POA: Diagnosis not present

## 2024-04-13 DIAGNOSIS — D6869 Other thrombophilia: Secondary | ICD-10-CM | POA: Diagnosis not present

## 2024-04-13 DIAGNOSIS — E261 Secondary hyperaldosteronism: Secondary | ICD-10-CM | POA: Diagnosis not present

## 2024-04-18 ENCOUNTER — Other Ambulatory Visit (HOSPITAL_COMMUNITY): Payer: Self-pay

## 2024-04-18 ENCOUNTER — Other Ambulatory Visit: Payer: Self-pay

## 2024-04-21 ENCOUNTER — Encounter: Payer: Self-pay | Admitting: Orthopedic Surgery

## 2024-04-21 NOTE — Progress Notes (Signed)
 Office Visit Note   Patient: Dan [name]           Date of Birth: 08/21/1951           MRN: [id number] Visit Date: 04/11/2024              Requested by: Shepard Ade, MD [address] [place],  [place] [address] PCP: Shepard Ade, MD  Chief Complaint  Patient presents with   Left Foot - Follow-up      HPI: Patient is a 73 year old gentleman a Wagner grade 1 ulcer first metatarsal head left foot.  Assessment & Plan: Visit Diagnoses:  1. Skin ulcer of left great toe, limited to breakdown of skin (HCC)     Plan: Patient was provided an additional felt relieving donut continue routine wound care and pressure offloading.  Patient will continue with Achilles stretching.  Follow-Up Instructions: Return in about 4 weeks (around 05/09/2024).   Ortho Exam  Patient is alert, oriented, no adenopathy, well-dressed, normal affect, normal respiratory effort. Examination patient has a Wagner grade 1 ulcer beneath the left first metatarsal head.  After informed consent a 10 blade knife was used to debride the skin and soft tissue back to healthy viable granulation tissue.  Silver nitrate was used for hemostasis.  The wound measures 2 cm in diameter after debridement.  There is no tunneling to bone or tendon.  No ischemic changes.    Imaging: No results found. No images are attached to the encounter.  Labs: Lab Results  Component Value Date   CRP <1.0 12/27/2022   LABURIC 6.5 07/31/2023   REPTSTATUS 11/08/2014 FINAL 11/02/2014   CULT  11/02/2014    NO GROWTH 5 DAYS Performed at [facility]      Lab Results  Component Value Date   ALBUMIN 4.3 12/27/2022   ALBUMIN 4.5 12/02/2016   ALBUMIN 3.4 (L) 11/03/2014    No results found for: MG No results found for: VD25OH  No results found for: PREALBUMIN    Latest Ref Rng & Units 07/24/2023    4:36 PM 12/27/2022    7:29 AM 12/02/2016   10:21 AM  CBC EXTENDED  WBC 3.8 - 10.8 Thousand/uL 10.8   7.2  8.0   RBC 4.20 - 5.80 Million/uL 5.81  4.97  5.27   Hemoglobin 13.2 - 17.1 g/dL 82.7  84.7  84.9   HCT 38.5 - 50.0 % 52.6  44.9  45.5   Platelets 140 - 400 Thousand/uL 223  195.0  224.0   NEUT# 1,500 - 7,800 cells/uL 7,517  4.3  6.0   Lymph# 0.7 - 4.0 K/uL  1.7  1.0      There is no height or weight on file to calculate BMI.  Orders:  No orders of the defined types were placed in this encounter.  No orders of the defined types were placed in this encounter.    Procedures: No procedures performed  Clinical Data: No additional findings.  ROS:  All other systems negative, except as noted in the HPI. Review of Systems  Objective: Vital Signs: There were no vitals taken for this visit.  Specialty Comments:  No specialty comments available.  PMFS History: Patient Active Problem List   Diagnosis Date Noted   Tinnitus, bilateral 08/08/2018   Lumbar pain 02/23/2018   Degeneration of lumbar intervertebral disc 02/23/2018   Increased body mass index 02/23/2018   History of total knee arthroplasty 10/24/2017   Osteoarthritis 10/24/2017   Acute recurrent  pansinusitis 09/13/2016   Chronic rhinitis 09/13/2016   Obstructive sleep apnea 09/13/2016   Primary osteoarthritis of left knee 04/30/2015   Left knee DJD 04/30/2015   Pneumonia 11/02/2014   Hypertension    Diabetes mellitus (HCC)    Hyperlipidemia    OTHER AND UNSPECIFIED COAGULATION DEFECTS 09/28/2010   Chronic a-fib (HCC) 09/28/2010   History of colonic polyps 09/28/2010   Past Medical History:  Diagnosis Date   Arthritis    osteoarthritis left knee   Atrial fibrillation (HCC)    BPH (benign prostatic hypertrophy)    Cardiac arrhythmia due to congenital heart disease    Cholelithiasis    Chronic atrial fibrillation (HCC)    Colon polyps    adenomatous   Diabetes mellitus    Diverticulosis    Dysrhythmia    tx. Pradaxa - Dr. Swaziland follows   Elevated LFTs    GERD (gastroesophageal reflux disease)     Hemorrhoids    Hyperlipidemia    Hypertension    Morbid obesity (HCC)    Sleep apnea    CPAP nightly   Tubular adenoma of colon     Family History  Problem Relation Age of Onset   Colon cancer Mother    Heart attack Father    Heart disease Father    Breast cancer Sister    Stomach cancer Neg Hx    Esophageal cancer Neg Hx     Past Surgical History:  Procedure Laterality Date   KNEE ARTHROSCOPY Left 03/28/2014   Procedure: LEFT KNEE ARTHROSCOPY WITH DEBRIDEMENT  ;  Surgeon: Reyes JAYSON Billing, MD;  Location: [facility] [place];  Service: Orthopedics;  Laterality: Left;   TONSILLECTOMY     TOTAL KNEE ARTHROPLASTY Left 04/30/2015   Procedure: LEFT TOTAL KNEE ARTHROPLASTY;  Surgeon: Reyes Billing, MD;  Location: [facility];  Service: Orthopedics;  Laterality: Left;   US  ECHOCARDIOGRAPHY  08/08/2006   ef 55-60%   Social History   Occupational History   Occupation: VP Insurance account manager: [employer]   Occupation: Airline pilot  Tobacco Use   Smoking status: Former    Current packs/day: 0.00    Types: Cigarettes    Quit date: 06/20/1991    Years since quitting: 32.8   Smokeless tobacco: Former  Building services engineer status: Never Used  Substance and Sexual Activity   Alcohol  use: Yes    Alcohol /week: 0.0 standard drinks of alcohol     Comment: social   Drug use: No   Sexual activity: Yes

## 2024-04-29 ENCOUNTER — Other Ambulatory Visit (HOSPITAL_COMMUNITY): Payer: Self-pay

## 2024-05-15 ENCOUNTER — Other Ambulatory Visit (HOSPITAL_COMMUNITY): Payer: Self-pay

## 2024-05-15 ENCOUNTER — Other Ambulatory Visit: Payer: Self-pay

## 2024-05-17 ENCOUNTER — Other Ambulatory Visit (HOSPITAL_COMMUNITY): Payer: Self-pay

## 2024-05-23 ENCOUNTER — Encounter: Payer: Self-pay | Admitting: Orthopedic Surgery

## 2024-05-23 ENCOUNTER — Ambulatory Visit: Admitting: Orthopedic Surgery

## 2024-05-23 DIAGNOSIS — L97521 Non-pressure chronic ulcer of other part of left foot limited to breakdown of skin: Secondary | ICD-10-CM | POA: Diagnosis not present

## 2024-05-23 DIAGNOSIS — E669 Obesity, unspecified: Secondary | ICD-10-CM | POA: Diagnosis not present

## 2024-05-23 DIAGNOSIS — E785 Hyperlipidemia, unspecified: Secondary | ICD-10-CM | POA: Diagnosis not present

## 2024-05-23 DIAGNOSIS — E11621 Type 2 diabetes mellitus with foot ulcer: Secondary | ICD-10-CM | POA: Diagnosis not present

## 2024-05-23 DIAGNOSIS — N1831 Chronic kidney disease, stage 3a: Secondary | ICD-10-CM | POA: Diagnosis not present

## 2024-05-23 DIAGNOSIS — M6702 Short Achilles tendon (acquired), left ankle: Secondary | ICD-10-CM | POA: Diagnosis not present

## 2024-05-23 DIAGNOSIS — R945 Abnormal results of liver function studies: Secondary | ICD-10-CM | POA: Diagnosis not present

## 2024-05-23 DIAGNOSIS — I10 Essential (primary) hypertension: Secondary | ICD-10-CM | POA: Diagnosis not present

## 2024-05-23 NOTE — Progress Notes (Signed)
 Office Visit Note   Patient: Dan [name]           Date of Birth: June 03, 1951           MRN: [id number] Visit Date: 05/23/2024              Requested by: Shepard Ade, MD [address] [place],  [place] [address] PCP: Shepard Ade, MD  Chief Complaint  Patient presents with   Left Foot - Follow-up      HPI: Patient is a 73 year old gentleman presents in follow-up for [facility] grade 1 ulcer first metatarsal head left foot.  Assessment & Plan: Visit Diagnoses:  1. Skin ulcer of left great toe, limited to breakdown of skin (HCC)   2. Achilles tendon contracture, left     Plan: Patient will continue with the felt relieving donuts.  Continue with Achilles stretching.  Recommended strengthening and exercise.  Follow-Up Instructions: Return if symptoms worsen or fail to improve.   Ortho Exam  Patient is alert, oriented, no adenopathy, well-dressed, normal affect, normal respiratory effort. Examination the ulcer on the left foot first metatarsal head is essentially healed.  It is 1 mm in diameter with healthy granulation tissue.  Patient has Achilles contracture with dorsiflexion and to neutral.  Patient states he is having some aching pain in his legs he is status post left total knee arthroplasty.  Recommended exercise and resistance strength training.  Patient has venous stasis changes in both lower extremities with brawny edema without ulceration.    Imaging: No results found. No images are attached to the encounter.  Labs: Lab Results  Component Value Date   CRP <1.0 12/27/2022   LABURIC 6.5 07/31/2023   REPTSTATUS 11/08/2014 FINAL 11/02/2014   CULT  11/02/2014    NO GROWTH 5 DAYS Performed at [facility]      Lab Results  Component Value Date   ALBUMIN 4.3 12/27/2022   ALBUMIN 4.5 12/02/2016   ALBUMIN 3.4 (L) 11/03/2014    No results found for: MG No results found for: VD25OH  No results found for: PREALBUMIN    Latest  Ref Rng & Units 07/24/2023    4:36 PM 12/27/2022    7:29 AM 12/02/2016   10:21 AM  CBC EXTENDED  WBC 3.8 - 10.8 Thousand/uL 10.8  7.2  8.0   RBC 4.20 - 5.80 Million/uL 5.81  4.97  5.27   Hemoglobin 13.2 - 17.1 g/dL 82.7  84.7  84.9   HCT 38.5 - 50.0 % 52.6  44.9  45.5   Platelets 140 - 400 Thousand/uL 223  195.0  224.0   NEUT# 1,500 - 7,800 cells/uL 7,517  4.3  6.0   Lymph# 0.7 - 4.0 K/uL  1.7  1.0      There is no height or weight on file to calculate BMI.  Orders:  No orders of the defined types were placed in this encounter.  No orders of the defined types were placed in this encounter.    Procedures: No procedures performed  Clinical Data: No additional findings.  ROS:  All other systems negative, except as noted in the HPI. Review of Systems  Objective: Vital Signs: There were no vitals taken for this visit.  Specialty Comments:  No specialty comments available.  PMFS History: Patient Active Problem List   Diagnosis Date Noted   Tinnitus, bilateral 08/08/2018   Lumbar pain 02/23/2018   Degeneration of lumbar intervertebral disc 02/23/2018   Increased body mass index 02/23/2018  History of total knee arthroplasty 10/24/2017   Osteoarthritis 10/24/2017   Acute recurrent pansinusitis 09/13/2016   Chronic rhinitis 09/13/2016   Obstructive sleep apnea 09/13/2016   Primary osteoarthritis of left knee 04/30/2015   Left knee DJD 04/30/2015   Pneumonia 11/02/2014   Hypertension    Diabetes mellitus (HCC)    Hyperlipidemia    OTHER AND UNSPECIFIED COAGULATION DEFECTS 09/28/2010   Chronic a-fib (HCC) 09/28/2010   History of colonic polyps 09/28/2010   Past Medical History:  Diagnosis Date   Arthritis    osteoarthritis left knee   Atrial fibrillation (HCC)    BPH (benign prostatic hypertrophy)    Cardiac arrhythmia due to congenital heart disease    Cholelithiasis    Chronic atrial fibrillation (HCC)    Colon polyps    adenomatous   Diabetes mellitus     Diverticulosis    Dysrhythmia    tx. Pradaxa - Dr. Swaziland follows   Elevated LFTs    GERD (gastroesophageal reflux disease)    Hemorrhoids    Hyperlipidemia    Hypertension    Morbid obesity (HCC)    Sleep apnea    CPAP nightly   Tubular adenoma of colon     Family History  Problem Relation Age of Onset   Colon cancer Mother    Heart attack Father    Heart disease Father    Breast cancer Sister    Stomach cancer Neg Hx    Esophageal cancer Neg Hx     Past Surgical History:  Procedure Laterality Date   KNEE ARTHROSCOPY Left 03/28/2014   Procedure: LEFT KNEE ARTHROSCOPY WITH DEBRIDEMENT  ;  Surgeon: Reyes JAYSON Billing, MD;  Location: [facility];  Service: Orthopedics;  Laterality: Left;   TONSILLECTOMY     TOTAL KNEE ARTHROPLASTY Left 04/30/2015   Procedure: LEFT TOTAL KNEE ARTHROPLASTY;  Surgeon: Reyes Billing, MD;  Location: [facility];  Service: Orthopedics;  Laterality: Left;   US  ECHOCARDIOGRAPHY  08/08/2006   ef 55-60%   Social History   Occupational History   Occupation: VP Insurance account manager: [employer]   Occupation: Airline pilot  Tobacco Use   Smoking status: Former    Current packs/day: 0.00    Types: Cigarettes    Quit date: 06/20/1991    Years since quitting: 32.9   Smokeless tobacco: Former  Building services engineer status: Never Used  Substance and Sexual Activity   Alcohol  use: Yes    Alcohol /week: 0.0 standard drinks of alcohol     Comment: social   Drug use: No   Sexual activity: Yes

## 2024-05-29 ENCOUNTER — Other Ambulatory Visit: Payer: Self-pay

## 2024-05-29 ENCOUNTER — Other Ambulatory Visit (HOSPITAL_COMMUNITY): Payer: Self-pay

## 2024-05-29 MED ORDER — NEBIVOLOL HCL 20 MG PO TABS
20.0000 mg | ORAL_TABLET | Freq: Two times a day (BID) | ORAL | 3 refills | Status: AC
  Filled 2024-05-29: qty 180, 90d supply, fill #0
  Filled 2024-08-09: qty 180, 90d supply, fill #1

## 2024-06-04 ENCOUNTER — Other Ambulatory Visit (HOSPITAL_COMMUNITY): Payer: Self-pay

## 2024-06-04 DIAGNOSIS — M5416 Radiculopathy, lumbar region: Secondary | ICD-10-CM | POA: Diagnosis not present

## 2024-06-04 DIAGNOSIS — M791 Myalgia, unspecified site: Secondary | ICD-10-CM | POA: Diagnosis not present

## 2024-06-04 MED ORDER — PREDNISONE 5 MG PO TABS
ORAL_TABLET | ORAL | 1 refills | Status: DC
  Filled 2024-06-04: qty 21, 6d supply, fill #0
  Filled 2024-06-10: qty 21, 6d supply, fill #1

## 2024-06-10 ENCOUNTER — Other Ambulatory Visit (HOSPITAL_COMMUNITY): Payer: Self-pay

## 2024-06-13 ENCOUNTER — Other Ambulatory Visit: Payer: Self-pay

## 2024-06-13 ENCOUNTER — Other Ambulatory Visit (HOSPITAL_COMMUNITY): Payer: Self-pay

## 2024-06-20 DIAGNOSIS — G4733 Obstructive sleep apnea (adult) (pediatric): Secondary | ICD-10-CM | POA: Diagnosis not present

## 2024-06-21 ENCOUNTER — Other Ambulatory Visit (HOSPITAL_COMMUNITY): Payer: Self-pay

## 2024-06-25 ENCOUNTER — Other Ambulatory Visit: Payer: Self-pay

## 2024-06-25 ENCOUNTER — Other Ambulatory Visit (HOSPITAL_COMMUNITY): Payer: Self-pay

## 2024-06-25 DIAGNOSIS — M48061 Spinal stenosis, lumbar region without neurogenic claudication: Secondary | ICD-10-CM | POA: Diagnosis not present

## 2024-06-25 DIAGNOSIS — I4891 Unspecified atrial fibrillation: Secondary | ICD-10-CM | POA: Diagnosis not present

## 2024-06-25 DIAGNOSIS — M545 Low back pain, unspecified: Secondary | ICD-10-CM | POA: Diagnosis not present

## 2024-06-25 DIAGNOSIS — M791 Myalgia, unspecified site: Secondary | ICD-10-CM | POA: Diagnosis not present

## 2024-06-25 DIAGNOSIS — M5416 Radiculopathy, lumbar region: Secondary | ICD-10-CM | POA: Diagnosis not present

## 2024-06-25 MED ORDER — OXYCODONE-ACETAMINOPHEN 5-325 MG PO TABS
1.0000 | ORAL_TABLET | Freq: Two times a day (BID) | ORAL | 0 refills | Status: AC | PRN
  Filled 2024-06-25: qty 14, 7d supply, fill #0

## 2024-06-26 ENCOUNTER — Telehealth: Payer: Self-pay

## 2024-06-26 NOTE — Telephone Encounter (Signed)
   Pre-operative Risk Assessment    Patient Name: Dan [name]  DOB: September 04, 1951 MRN: [id number]   Date of last office visit: 02/22/24 PETER SWAZILAND, MD Date of next office visit: NONE   Request for Surgical Clearance    Procedure:  EPIDURAL STEROID INJECTION, LUMBAR (PROC) - RIGHT L4-5 ESI  Date of Surgery:  Clearance TBD                                Surgeon:  DR BONNER Socks Group or Practice Name:  [facility] Phone number:  [phone]  EXT 51310 Fax number:  [phone]  ATTN: MELISSA B   Type of Clearance Requested:   - Medical  - Pharmacy:  Hold Apixaban  (Eliquis ) 3 DAYS PRIOR   Type of Anesthesia:  Not Indicated   Additional requests/questions:    Signed, Lucie DELENA Ku   06/26/2024, 11:27 AM \

## 2024-06-26 NOTE — Telephone Encounter (Signed)
 Pharmacy please advise on holding Eliquis  for 3 days prior to EPIDURAL STEROID INJECTION, LUMBAR (PROC) - RIGHT L4-5 ESI  scheduled for TBD. Thank you.

## 2024-06-26 NOTE — Telephone Encounter (Signed)
   Name: Dan [name]  DOB: Mar 25, 1951  MRN: [id number]  Primary Cardiologist: None   Preoperative team, please contact this patient and set up a phone call appointment for further preoperative risk assessment. Please obtain consent and complete medication review. Thank you for your help.  I confirm that guidance regarding antiplatelet and oral anticoagulation therapy has been completed and, if necessary, noted below.  I also confirmed the patient resides in the state of [place] . As per [facility] Medical Board telemedicine laws, the patient must reside in the state in which the provider is licensed.   [name], NP 06/26/2024, 11:42 AM [facility]

## 2024-06-28 ENCOUNTER — Telehealth: Payer: Self-pay

## 2024-06-28 NOTE — Telephone Encounter (Signed)
  Patient Consent for Virtual Visit         Dan [name] has provided verbal consent on 06/28/2024 for a virtual visit (video or telephone).  Appointment scheduled for 07/08/2024 @ 2:20pm. Med req and consent are complete. Call patient at [phone]. Patient advised to HOLD Eliquis  3 days prior to procedure, depending on the day it is scheduled. Patient voiced understanding.    CONSENT FOR VIRTUAL VISIT FOR:  Dan [name]  By participating in this virtual visit I agree to the following:  I hereby voluntarily request, consent and authorize [facility] and its employed or contracted physicians, physician assistants, nurse practitioners or other licensed health care professionals (the Practitioner), to provide me with telemedicine health care services (the "Services) as deemed necessary by the treating Practitioner. I acknowledge and consent to receive the Services by the Practitioner via telemedicine. I understand that the telemedicine visit will involve communicating with the Practitioner through live audiovisual communication technology and the disclosure of certain medical information by electronic transmission. I acknowledge that I have been given the opportunity to request an in-person assessment or other available alternative prior to the telemedicine visit and am voluntarily participating in the telemedicine visit.  I understand that I have the right to withhold or withdraw my consent to the use of telemedicine in the course of my care at any time, without affecting my right to future care or treatment, and that the Practitioner or I may terminate the telemedicine visit at any time. I understand that I have the right to inspect all information obtained and/or recorded in the course of the telemedicine visit and may receive copies of available information for a reasonable fee.  I understand that some of the potential risks of receiving the Services via telemedicine  include:  Delay or interruption in medical evaluation due to technological equipment failure or disruption; Information transmitted may not be sufficient (e.g. poor resolution of images) to allow for appropriate medical decision making by the Practitioner; and/or  In rare instances, security protocols could fail, causing a breach of personal health information.  Furthermore, I acknowledge that it is my responsibility to provide information about my medical history, conditions and care that is complete and accurate to the best of my ability. I acknowledge that Practitioner's advice, recommendations, and/or decision may be based on factors not within their control, such as incomplete or inaccurate data provided by me or distortions of diagnostic images or specimens that may result from electronic transmissions. I understand that the practice of medicine is not an exact science and that Practitioner makes no warranties or guarantees regarding treatment outcomes. I acknowledge that a copy of this consent can be made available to me via my patient portal [facility] MyChart), or I can request a printed copy by calling the office of [facility].    I understand that my insurance will be billed for this visit.   I have read or had this consent read to me. I understand the contents of this consent, which adequately explains the benefits and risks of the Services being provided via telemedicine.  I have been provided ample opportunity to ask questions regarding this consent and the Services and have had my questions answered to my satisfaction. I give my informed consent for the services to be provided through the use of telemedicine in my medical care

## 2024-06-28 NOTE — Telephone Encounter (Signed)
 Appointment scheduled for 07/08/2024 @ 2:20pm. Med req and consent are complete. Call patient at [phone]. Patient advised to HOLD Eliquis  3 days prior to procedure, depending on the day it is scheduled. Patient voiced understanding.

## 2024-07-04 ENCOUNTER — Other Ambulatory Visit (HOSPITAL_COMMUNITY): Payer: Self-pay

## 2024-07-05 NOTE — Telephone Encounter (Signed)
 Patient with diagnosis of afib on Eliquis  for anticoagulation.    Procedure:  EPIDURAL STEROID INJECTION, LUMBAR (PROC) - RIGHT L4-5 ESI  Date of procedure: TBD   CHA2DS2-VASc Score = 4   This indicates a 4.8% annual risk of stroke. The patient's score is based upon: CHF History: 0 HTN History: 1 Diabetes History: 1 Stroke History: 0 Vascular Disease History: 0 Age Score: 2 Gender Score: 0      CrCl 72 ml/min Platelet count 223  Patient has not had an Afib/aflutter ablation or Watchman within the last 3 months or DCCV within the last 30 days   Per office protocol, patient can hold Eliquis  for 3 days prior to procedure.    **This guidance is not considered finalized until pre-operative APP has relayed final recommendations.**

## 2024-07-05 NOTE — Telephone Encounter (Signed)
   Name: SEBASTAIN FISHBAUGH  DOB: [birth year]  MRN: [id number]  Primary Cardiologist: None  Chart reviewed as part of pre-operative protocol coverage. The patient has an upcoming virtual visit scheduled on 07/08/2024 at which time clearance can be addressed in case there are any issues that would impact surgical recommendations.  EPIDURAL STEROID INJECTION, LUMBAR (PROC) - RIGHT L4-5 ESI Is not scheduled until TBD as below. I added preop FYI to appointment note so that provider is aware to address at time of outpatient visit.  Per office protocol the cardiology provider should forward their finalized clearance decision and recommendations regarding antiplatelet therapy to the requesting party below.    Per office protocol, patient can hold Eliquis  for 3 days prior to procedure.    I will route this message as FYI to requesting party and remove this message from the preop box as separate preop APP input not needed at this time.   Please call with any questions.  [name], NP  07/05/2024, 12:23 PM

## 2024-07-08 ENCOUNTER — Ambulatory Visit

## 2024-07-08 ENCOUNTER — Other Ambulatory Visit: Payer: Self-pay

## 2024-07-08 NOTE — Progress Notes (Signed)
   Virtual Visit via Telephone Note   Because of Dan [name]'s co-morbid illnesses, he is at least at moderate risk for complications without adequate follow up.  This format is felt to be most appropriate for this patient at this time.  The patient did not have access to video technology/had technical difficulties with video requiring transitioning to audio format only (telephone).  All issues noted in this document were discussed and addressed.  No physical exam could be performed with this format.  Please refer to the patient's chart for his consent to telehealth for [facility]  Evaluation Performed:  Preoperative cardiovascular risk assessment _____________   Date:  07/08/2024   Patient ID:  Dan [name], DOB 11-22-1950, MRN [id number] Patient Location:  Home Provider location:   Office  Patient has decided to not proceed with injections at this time. He is currently not having back pain and is hoping he will not need injections. He understands if he decided in the future that he does need injections he will have to start the pre-op process over and he is good with that. Will no charge and cancel his visit for today.      Barnie Hila, NP  07/08/2024, 8:44 AM

## 2024-07-23 ENCOUNTER — Telehealth (HOSPITAL_BASED_OUTPATIENT_CLINIC_OR_DEPARTMENT_OTHER): Payer: Self-pay

## 2024-07-23 NOTE — Telephone Encounter (Signed)
   Pre-operative Risk Assessment    Patient Name: Dan [name]  DOB: 1951/07/27 MRN: [id number]   Date of last office visit: 02/22/24 with Dr. [place] Date of next office visit: N/A   Request for Surgical Clearance    Procedure:  Epidural steroid injection, lumbar (PROC) - right L4-5 ESI prior to scheduling with Dr. Bonner  Date of Surgery:  Clearance TBD                                 Surgeon:  Dr. Bonner Surgeon's Group or Practice Name:  [facility] Phone number:  [phone] ext [phone] Fax number:  [phone]   Type of Clearance Requested:   - Medical  - Pharmacy:  Hold Apixaban  (Eliquis ) Come off Eliquis  for 3 days prior to epidural steroid injection   Type of Anesthesia:  Does not specify   Additional requests/questions:  **Please contact patient DIRECTLY with any instructions**  Signed, Patrcia Iverson CROME   07/23/2024, 5:28 PM

## 2024-07-24 NOTE — Telephone Encounter (Signed)
 Date:  07/08/2024    Patient ID:  Dan [name], DOB 28-Apr-1951, MRN [id number] Patient Location:  Home Provider location:   Office   Patient has decided to not proceed with injections at this time. He is currently not having back pain and is hoping he will not need injections. He understands if he decided in the future that he does need injections he will have to start the pre-op process over and he is good with that. Will no charge and cancel his visit for today.      Will remove from Pre-Op Pool Barnie Hila, NP

## 2024-08-02 ENCOUNTER — Other Ambulatory Visit (HOSPITAL_COMMUNITY): Payer: Self-pay

## 2024-08-05 ENCOUNTER — Encounter: Payer: Self-pay | Admitting: Orthopedic Surgery

## 2024-08-05 ENCOUNTER — Ambulatory Visit: Admitting: Orthopedic Surgery

## 2024-08-05 ENCOUNTER — Encounter: Payer: Self-pay | Admitting: Radiology

## 2024-08-05 DIAGNOSIS — M6702 Short Achilles tendon (acquired), left ankle: Secondary | ICD-10-CM

## 2024-08-05 DIAGNOSIS — L97521 Non-pressure chronic ulcer of other part of left foot limited to breakdown of skin: Secondary | ICD-10-CM

## 2024-08-05 NOTE — Progress Notes (Signed)
 Office Visit Note   Patient: Dan [name]           Date of Birth: [birth year]           MRN: [id number] Visit Date: 08/05/2024              Requested by: Shepard Ade, MD [address] [place],  [place] [address] PCP: Shepard Ade, MD  Chief Complaint  Patient presents with   Left Foot - Follow-up      HPI: Discussed the use of AI scribe software for clinical note transcription with the patient, who gave verbal consent to proceed.  History of Present Illness Dan [name] is a 73 year old male who presents for follow-up of a persistent Wagner grade 1 ulcer beneath the first metatarsal head.  The callus grows quickly and may require trimming. The ulcer continues to drain slightly, but there is no redness or cellulitis around it.     Assessment & Plan: Visit Diagnoses:  1. Skin ulcer of left great toe, limited to breakdown of skin (HCC)   2. 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 No infection. Ulcer does not probe to bone or tendon. Rapid callus growth due to pressure. - Debrided to bleeding viable granulation tissue with 10 blade. - Applied silver nitrate. - Applied compression dressing. - Re-evaluate in four weeks.      Follow-Up Instructions: Return in about 4 weeks (around 09/02/2024).   Ortho Exam  Patient is alert, oriented, no adenopathy, well-dressed, normal affect, normal respiratory effort. Physical Exam EXTREMITIES: Persistent Wagner grade ulcer beneath the left first metatarsal head with hypertrophic callus around the edges. No redness or cellulitis. Ulcer does not probe to bone or tendon.  After informed consent a 10 blade knife was used to debride the skin and soft tissue from the ulcer left foot.  Ulcer measures 2 cm in diameter after debridement.   Silver nitrate was used for hemostasis.      Imaging: No results found. No images are attached to the encounter.  Labs: Lab Results  Component Value Date   CRP <1.0 12/27/2022   LABURIC 6.5 07/31/2023   REPTSTATUS 11/08/2014 FINAL 11/02/2014   CULT  11/02/2014    NO GROWTH 5 DAYS Performed at [facility]      Lab Results  Component Value Date   ALBUMIN 4.3 12/27/2022   ALBUMIN 4.5 12/02/2016   ALBUMIN 3.4 (L) 11/03/2014    No results found for: MG No results found for: VD25OH  No results found for: PREALBUMIN    Latest Ref Rng & Units 07/24/2023    4:36 PM 12/27/2022    7:29 AM 12/02/2016   10:21 AM  CBC EXTENDED  WBC 3.8 - 10.8 Thousand/uL 10.8  7.2  8.0   RBC 4.20 - 5.80 Million/uL 5.81  4.97  5.27   Hemoglobin 13.2 - 17.1 g/dL 82.7  84.7  84.9   HCT 38.5 - 50.0 % 52.6  44.9  45.5   Platelets 140 - 400 Thousand/uL 223  195.0  224.0   NEUT# 1,500 - 7,800 cells/uL 7,517  4.3  6.0   Lymph# 0.7 - 4.0 K/uL  1.7  1.0      There is no height or weight on file to calculate BMI.  Orders:  No orders of the defined types were  placed in this encounter.  No orders of the defined types were placed in this encounter.    Procedures: No procedures performed  Clinical Data: No additional findings.  ROS:  All other systems negative, except as noted in the HPI. Review of Systems  Objective: Vital Signs: There were no vitals taken for this visit.  Specialty Comments:  No specialty comments available.  PMFS History: Patient Active Problem List   Diagnosis Date Noted   Tinnitus, bilateral 08/08/2018   Lumbar pain 02/23/2018   Degeneration of lumbar intervertebral disc 02/23/2018   Increased body mass index 02/23/2018   History of total knee arthroplasty 10/24/2017   Osteoarthritis 10/24/2017   Acute recurrent pansinusitis 09/13/2016   Chronic rhinitis 09/13/2016   Obstructive sleep apnea 09/13/2016   Primary osteoarthritis of left knee  04/30/2015   Left knee DJD 04/30/2015   Pneumonia 11/02/2014   Hypertension    Diabetes mellitus (HCC)    Hyperlipidemia    OTHER AND UNSPECIFIED COAGULATION DEFECTS 09/28/2010   Chronic a-fib (HCC) 09/28/2010   History of colonic polyps 09/28/2010   Past Medical History:  Diagnosis Date   Arthritis    osteoarthritis left knee   Atrial fibrillation (HCC)    BPH (benign prostatic hypertrophy)    Cardiac arrhythmia due to congenital heart disease    Cholelithiasis    Chronic atrial fibrillation (HCC)    Colon polyps    adenomatous   Diabetes mellitus    Diverticulosis    Dysrhythmia    tx. Pradaxa - Dr. Jordan follows   Elevated LFTs    GERD (gastroesophageal reflux disease)    Hemorrhoids    Hyperlipidemia    Hypertension    Morbid obesity (HCC)    Sleep apnea    CPAP nightly   Tubular adenoma of colon     Family History  Problem Relation Age of Onset   Colon cancer Mother    Heart attack Father    Heart disease Father    Breast cancer Sister    Stomach cancer Neg Hx    Esophageal cancer Neg Hx     Past Surgical History:  Procedure Laterality Date   KNEE ARTHROSCOPY Left 03/28/2014   Procedure: LEFT KNEE ARTHROSCOPY WITH DEBRIDEMENT  ;  Surgeon: Reyes JAYSON Billing, MD;  Location: [facility];  Service: Orthopedics;  Laterality: Left;   TONSILLECTOMY     TOTAL KNEE ARTHROPLASTY Left 04/30/2015   Procedure: LEFT TOTAL KNEE ARTHROPLASTY;  Surgeon: Reyes Billing, MD;  Location: [facility];  Service: Orthopedics;  Laterality: Left;   US  ECHOCARDIOGRAPHY  08/08/2006   ef 55-60%   Social History   Occupational History   Occupation: VP Insurance Account Manager: [employer]   Occupation: airline pilot  Tobacco Use   Smoking status: Former    Current packs/day: 0.00    Types: Cigarettes    Quit date: 06/20/1991    Years since quitting: 33.1   Smokeless tobacco: Former  Building Services Engineer status: Never Used  Substance and Sexual Activity    Alcohol  use: Yes    Alcohol /week: 0.0 standard drinks of alcohol     Comment: social   Drug use: No   Sexual activity: Yes

## 2024-08-09 ENCOUNTER — Other Ambulatory Visit: Payer: Self-pay

## 2024-08-14 ENCOUNTER — Other Ambulatory Visit (HOSPITAL_COMMUNITY): Payer: Self-pay

## 2024-08-14 ENCOUNTER — Other Ambulatory Visit: Payer: Self-pay

## 2024-08-20 ENCOUNTER — Other Ambulatory Visit (HOSPITAL_COMMUNITY): Payer: Self-pay

## 2024-08-22 ENCOUNTER — Other Ambulatory Visit: Payer: Self-pay

## 2024-08-22 ENCOUNTER — Other Ambulatory Visit (HOSPITAL_COMMUNITY): Payer: Self-pay

## 2024-08-22 MED ORDER — OZEMPIC (2 MG/DOSE) 8 MG/3ML ~~LOC~~ SOPN
2.0000 mg | PEN_INJECTOR | SUBCUTANEOUS | 3 refills | Status: AC
  Filled 2024-08-22: qty 3, 28d supply, fill #0
  Filled 2024-09-14: qty 3, 28d supply, fill #1
  Filled 2024-10-14: qty 3, 28d supply, fill #2

## 2024-08-26 ENCOUNTER — Other Ambulatory Visit (HOSPITAL_COMMUNITY): Payer: Self-pay

## 2024-08-26 DIAGNOSIS — M791 Myalgia, unspecified site: Secondary | ICD-10-CM | POA: Diagnosis not present

## 2024-08-26 DIAGNOSIS — M48061 Spinal stenosis, lumbar region without neurogenic claudication: Secondary | ICD-10-CM | POA: Diagnosis not present

## 2024-08-26 DIAGNOSIS — M5416 Radiculopathy, lumbar region: Secondary | ICD-10-CM | POA: Diagnosis not present

## 2024-08-26 MED ORDER — PREDNISONE 5 MG (21) PO TBPK
ORAL_TABLET | ORAL | 0 refills | Status: DC
  Filled 2024-08-26: qty 21, 6d supply, fill #0

## 2024-08-26 MED ORDER — OXYCODONE-ACETAMINOPHEN 5-325 MG PO TABS
1.0000 | ORAL_TABLET | Freq: Two times a day (BID) | ORAL | 0 refills | Status: AC | PRN
  Filled 2024-08-26: qty 14, 7d supply, fill #0

## 2024-09-09 ENCOUNTER — Ambulatory Visit: Admitting: Orthopedic Surgery

## 2024-09-09 ENCOUNTER — Encounter: Payer: Self-pay | Admitting: Orthopedic Surgery

## 2024-09-09 DIAGNOSIS — L97521 Non-pressure chronic ulcer of other part of left foot limited to breakdown of skin: Secondary | ICD-10-CM

## 2024-09-09 DIAGNOSIS — M6702 Short Achilles tendon (acquired), left ankle: Secondary | ICD-10-CM

## 2024-09-09 NOTE — Progress Notes (Signed)
 Office Visit Note   Patient: Dan [name]           Date of Birth: Aug 28, 1951           MRN: [id number] Visit Date: 09/09/2024              Requested by: Shepard Ade, MD [address] [place],  [place] [address] PCP: Shepard Ade, MD  Chief Complaint  Patient presents with   Left Foot - Follow-up      HPI: Discussed the use of AI scribe software for clinical note transcription with the patient, who gave verbal consent to proceed.  History of Present Illness Dan [name] is a 73 year old male who presents with ongoing drainage and pressure-related issues from a Wagner grade one ulcer beneath the first metatarsal head.  He experiences ongoing drainage from the ulcer beneath the first metatarsal head, which he manages by wearing a bandaid. The drainage is minimal and non-hemorrhagic.  He uses a plate in his shoes to stiffen them, which was provided to him. These are orthopedic type shoes with a green plastic insert for each foot. He also mentions that his Achilles tendon is tight, contributing to pressure on the front of his foot.  He receives a pedicure every two weeks and uses cream on his feet, although this does not alleviate the pressure-related issues. He wears synthetic socks.     Assessment & Plan: Visit Diagnoses:  1. Skin ulcer of left great toe, limited to breakdown of skin (HCC)   2. Achilles tendon contracture, left     Plan: Assessment and Plan Assessment & Plan Left plantar foot ulcer beneath first metatarsal head Chronic ulcer likely due to excessive pressure from soft shoes. Debridement performed to promote healing. - Debrided ulcer to bleeding viable granulation tissue, applied silver nitrate. - Applied Band-Aid. - Advised use of carbon fiber plate in shoe to reduce forefoot pressure. - Discussed gastrocnemius recession if conservative measures fail.  Left Achilles tendon contracture Contributing to increased forefoot  pressure, exacerbating ulcer. Surgical lengthening discussed if conservative measures fail. - Discussed gastrocnemius recession to alleviate forefoot pressure.  Tinea pedis (fungal foot infection) Likely due to moisture retention from synthetic socks. - Recommended wool or alpaca socks to wick moisture and prevent fungal growth.      Follow-Up Instructions: No follow-ups on file.   Ortho Exam  Patient is alert, oriented, no adenopathy, well-dressed, normal affect, normal respiratory effort. Physical Exam GENITOURINARY: Palpable dorsal penis pulse with knee extended. MUSCULOSKELETAL: Dorsiflexion 20 degrees short of neutral with Achilles contracture. New sneakers soft and flexible, overloading forefoot. SKIN: Wagner grade wound beneath first metatarsal head. Wound 1 cm diameter before debridement, 2 cm after.      Imaging: No results found. No images are attached to the encounter.  Labs: Lab Results  Component Value Date   CRP <1.0 12/27/2022   LABURIC 6.5 07/31/2023   REPTSTATUS 11/08/2014 FINAL 11/02/2014   CULT  11/02/2014    NO GROWTH 5 DAYS Performed at [facility]      Lab Results  Component Value Date   ALBUMIN 4.3 12/27/2022   ALBUMIN 4.5 12/02/2016   ALBUMIN 3.4 (L) 11/03/2014    No results found for: MG No results found for: VD25OH  No results found for: PREALBUMIN    Latest Ref Rng & Units 07/24/2023    4:36 PM 12/27/2022    7:29 AM 12/02/2016   10:21 AM  CBC EXTENDED  WBC 3.8 -  10.8 Thousand/uL 10.8  7.2  8.0   RBC 4.20 - 5.80 Million/uL 5.81  4.97  5.27   Hemoglobin 13.2 - 17.1 g/dL 82.7  84.7  84.9   HCT 38.5 - 50.0 % 52.6  44.9  45.5   Platelets 140 - 400 Thousand/uL 223  195.0  224.0   NEUT# 1,500 - 7,800 cells/uL 7,517  4.3  6.0   Lymph# 0.7 - 4.0 K/uL  1.7  1.0      There is no height or weight on file to calculate BMI.  Orders:  No orders of the defined types were placed in this encounter.  No orders of the defined  types were placed in this encounter.    Procedures: No procedures performed  Clinical Data: No additional findings.  ROS:  All other systems negative, except as noted in the HPI. Review of Systems  Objective: Vital Signs: There were no vitals taken for this visit.  Specialty Comments:  No specialty comments available.  PMFS History: Patient Active Problem List   Diagnosis Date Noted   Tinnitus, bilateral 08/08/2018   Lumbar pain 02/23/2018   Degeneration of lumbar intervertebral disc 02/23/2018   Increased body mass index 02/23/2018   History of total knee arthroplasty 10/24/2017   Osteoarthritis 10/24/2017   Acute recurrent pansinusitis 09/13/2016   Chronic rhinitis 09/13/2016   Obstructive sleep apnea 09/13/2016   Primary osteoarthritis of left knee 04/30/2015   Left knee DJD 04/30/2015   Pneumonia 11/02/2014   Hypertension    Diabetes mellitus (HCC)    Hyperlipidemia    OTHER AND UNSPECIFIED COAGULATION DEFECTS 09/28/2010   Chronic a-fib (HCC) 09/28/2010   History of colonic polyps 09/28/2010   Past Medical History:  Diagnosis Date   Arthritis    osteoarthritis left knee   Atrial fibrillation (HCC)    BPH (benign prostatic hypertrophy)    Cardiac arrhythmia due to congenital heart disease    Cholelithiasis    Chronic atrial fibrillation (HCC)    Colon polyps    adenomatous   Diabetes mellitus    Diverticulosis    Dysrhythmia    tx. Pradaxa - Dr. Jordan follows   Elevated LFTs    GERD (gastroesophageal reflux disease)    Hemorrhoids    Hyperlipidemia    Hypertension    Morbid obesity (HCC)    Sleep apnea    CPAP nightly   Tubular adenoma of colon     Family History  Problem Relation Age of Onset   Colon cancer Mother    Heart attack Father    Heart disease Father    Breast cancer Sister    Stomach cancer Neg Hx    Esophageal cancer Neg Hx     Past Surgical History:  Procedure Laterality Date   KNEE ARTHROSCOPY Left 03/28/2014   Procedure:  LEFT KNEE ARTHROSCOPY WITH DEBRIDEMENT  ;  Surgeon: Reyes JAYSON Billing, MD;  Location: [facility] [place];  Service: Orthopedics;  Laterality: Left;   TONSILLECTOMY     TOTAL KNEE ARTHROPLASTY Left 04/30/2015   Procedure: LEFT TOTAL KNEE ARTHROPLASTY;  Surgeon: Reyes Billing, MD;  Location: [facility];  Service: Orthopedics;  Laterality: Left;   US  ECHOCARDIOGRAPHY  08/08/2006   ef 55-60%   Social History   Occupational History   Occupation: VP Insurance Account Manager: [employer]   Occupation: airline pilot  Tobacco Use   Smoking status: Former    Current packs/day: 0.00    Types: Cigarettes    Quit  date: 06/20/1991    Years since quitting: 33.2   Smokeless tobacco: Former  Building Services Engineer status: Never Used  Substance and Sexual Activity   Alcohol  use: Yes    Alcohol /week: 0.0 standard drinks of alcohol     Comment: social   Drug use: No   Sexual activity: Yes

## 2024-09-14 ENCOUNTER — Other Ambulatory Visit (HOSPITAL_COMMUNITY): Payer: Self-pay

## 2024-09-16 ENCOUNTER — Other Ambulatory Visit: Payer: Self-pay

## 2024-09-30 ENCOUNTER — Other Ambulatory Visit: Payer: Self-pay

## 2024-10-11 ENCOUNTER — Other Ambulatory Visit: Payer: Self-pay

## 2024-10-11 ENCOUNTER — Other Ambulatory Visit (HOSPITAL_COMMUNITY): Payer: Self-pay

## 2024-10-11 MED ORDER — OXYCODONE HCL 5 MG PO TABS
5.0000 mg | ORAL_TABLET | Freq: Two times a day (BID) | ORAL | 0 refills | Status: AC
  Filled 2024-10-11: qty 14, 7d supply, fill #0

## 2024-10-11 MED ORDER — PREDNISONE 5 MG (21) PO TBPK
ORAL_TABLET | ORAL | 1 refills | Status: AC
  Filled 2024-10-11: qty 21, 6d supply, fill #0

## 2024-10-14 ENCOUNTER — Other Ambulatory Visit: Payer: Self-pay

## 2024-10-21 ENCOUNTER — Encounter: Payer: Self-pay | Admitting: Orthopedic Surgery

## 2024-10-21 ENCOUNTER — Ambulatory Visit: Admitting: Orthopedic Surgery

## 2024-10-21 DIAGNOSIS — M6702 Short Achilles tendon (acquired), left ankle: Secondary | ICD-10-CM

## 2024-10-21 DIAGNOSIS — L97521 Non-pressure chronic ulcer of other part of left foot limited to breakdown of skin: Secondary | ICD-10-CM

## 2024-10-21 NOTE — Progress Notes (Signed)
 "  Office Visit Note   Patient: Dan [name]           Date of Birth: 06/10/1951           MRN: [id number] Visit Date: 10/21/2024              Requested by: Shepard Ade, MD [address] [place],  [place] [address] PCP: Shepard Ade, MD  Chief Complaint  Patient presents with   Left Foot - Wound Check      HPI: Discussed the use of AI scribe software for clinical note transcription with the patient, who gave verbal consent to proceed.  History of Present Illness Dan [name] is a 74 year old male with chronic venous insufficiency who presents for follow-up of a Wagner grade one ulcer of the left foot.  He is receiving conservative wound management for a Wagner grade one ulcer located beneath the first metatarsal head of the left foot.  He has had minor blistering at the ulcer site, attributed to Band-Aid use. He continues to use Band-Aids for wound protection and has been compliant with regular wound care follow-up.     Assessment & Plan: Visit Diagnoses:  1. Skin ulcer of left great toe, limited to breakdown of skin (HCC)   2. Achilles tendon contracture, left     Plan: Assessment and Plan Assessment & Plan Wagner grade 1 ulcer of the left foot Chronic, healing Wagner grade 1 ulcer beneath the first metatarsal head of the left foot. Ulcer size increased post-debridement due to removal of nonviable tissue, but overall wound is improving with conservative management. - Debrided the ulcer with a 10 blade to healthy viable tissue. - Applied adhesive dressing to the wound. - Instructed him to continue conservative wound care. - Scheduled follow-up in 6-8 weeks.  Chronic venous insufficiency of the lower extremity Chronic venous insufficiency with persistent Achilles tendon tightness. No open venous ulcers or acute complications. - Reassess at next follow-up in 6-8 weeks.      Follow-Up Instructions: Return in about 2 months (around 12/19/2024).    Ortho Exam  Patient is alert, oriented, no adenopathy, well-dressed, normal affect, normal respiratory effort. Physical Exam MUSCULOSKELETAL: Tightness of the Achilles with venous insufficiency, no open venous ulcers. Improved healing of Wagner grade 1 ulcer beneath the first metatarsal head, left foot. Ulcer 0.5 mm in diameter before debridement, 1 cm after debridement.   After informed consent a 10 blade knife was used to debride the skin and soft tissue from the [facility] grade 1 ulcer left first metatarsal head.  The wound was 1 cm diameter prior to debridement 2 cm in diameter after debridement.  There is good healthy tissue no exposed bone or tendon no tunneling.   Imaging: No results found. No images are attached to the encounter.  Labs: Lab Results  Component Value Date   CRP <1.0 12/27/2022   LABURIC 6.5 07/31/2023   REPTSTATUS 11/08/2014 FINAL 11/02/2014   CULT  11/02/2014    NO GROWTH 5 DAYS Performed at [facility]      Lab Results  Component Value Date   ALBUMIN 4.3 12/27/2022   ALBUMIN 4.5 12/02/2016   ALBUMIN 3.4 (L) 11/03/2014    No results found for: MG No results found for: VD25OH  No results found for: PREALBUMIN    Latest Ref Rng & Units 07/24/2023    4:36 PM 12/27/2022    7:29 AM 12/02/2016   10:21 AM  CBC EXTENDED  WBC  3.8 - 10.8 Thousand/uL 10.8  7.2  8.0   RBC 4.20 - 5.80 Million/uL 5.81  4.97  5.27   Hemoglobin 13.2 - 17.1 g/dL 82.7  84.7  84.9   HCT 38.5 - 50.0 % 52.6  44.9  45.5   Platelets 140 - 400 Thousand/uL 223  195.0  224.0   NEUT# 1,500 - 7,800 cells/uL 7,517  4.3  6.0   Lymph# 0.7 - 4.0 K/uL  1.7  1.0      There is no height or weight on file to calculate BMI.  Orders:  No orders of the defined types were placed in this encounter.  No orders of the defined types were placed in this encounter.    Procedures: No procedures performed  Clinical Data: No additional findings.  ROS:  All other systems  negative, except as noted in the HPI. Review of Systems  Objective: Vital Signs: There were no vitals taken for this visit.  Specialty Comments:  No specialty comments available.  PMFS History: Patient Active Problem List   Diagnosis Date Noted   Tinnitus, bilateral 08/08/2018   Lumbar pain 02/23/2018   Degeneration of lumbar intervertebral disc 02/23/2018   Increased body mass index 02/23/2018   History of total knee arthroplasty 10/24/2017   Osteoarthritis 10/24/2017   Acute recurrent pansinusitis 09/13/2016   Chronic rhinitis 09/13/2016   Obstructive sleep apnea 09/13/2016   Primary osteoarthritis of left knee 04/30/2015   Left knee DJD 04/30/2015   Pneumonia 11/02/2014   Hypertension    Diabetes mellitus (HCC)    Hyperlipidemia    OTHER AND UNSPECIFIED COAGULATION DEFECTS 09/28/2010   Chronic a-fib (HCC) 09/28/2010   History of colonic polyps 09/28/2010   Past Medical History:  Diagnosis Date   Arthritis    osteoarthritis left knee   Atrial fibrillation (HCC)    BPH (benign prostatic hypertrophy)    Cardiac arrhythmia due to congenital heart disease    Cholelithiasis    Chronic atrial fibrillation (HCC)    Colon polyps    adenomatous   Diabetes mellitus    Diverticulosis    Dysrhythmia    tx. Pradaxa - Dr. Jordan follows   Elevated LFTs    GERD (gastroesophageal reflux disease)    Hemorrhoids    Hyperlipidemia    Hypertension    Morbid obesity (HCC)    Sleep apnea    CPAP nightly   Tubular adenoma of colon     Family History  Problem Relation Age of Onset   Colon cancer Mother    Heart attack Father    Heart disease Father    Breast cancer Sister    Stomach cancer Neg Hx    Esophageal cancer Neg Hx     Past Surgical History:  Procedure Laterality Date   KNEE ARTHROSCOPY Left 03/28/2014   Procedure: LEFT KNEE ARTHROSCOPY WITH DEBRIDEMENT  ;  Surgeon: Reyes JAYSON Billing, MD;  Location: [facility] [place];  Service: Orthopedics;  Laterality:  Left;   TONSILLECTOMY     TOTAL KNEE ARTHROPLASTY Left 04/30/2015   Procedure: LEFT TOTAL KNEE ARTHROPLASTY;  Surgeon: Reyes Billing, MD;  Location: [facility];  Service: Orthopedics;  Laterality: Left;   US  ECHOCARDIOGRAPHY  08/08/2006   ef 55-60%   Social History   Occupational History   Occupation: VP Insurance Account Manager: [employer]   Occupation: airline pilot  Tobacco Use   Smoking status: Former    Current packs/day: 0.00    Types: Cigarettes  Quit date: 06/20/1991    Years since quitting: 33.3   Smokeless tobacco: Former  Building Services Engineer status: Never Used  Substance and Sexual Activity   Alcohol  use: Yes    Alcohol /week: 0.0 standard drinks of alcohol     Comment: social   Drug use: No   Sexual activity: Yes         "

## 2024-11-01 ENCOUNTER — Other Ambulatory Visit (HOSPITAL_COMMUNITY): Payer: Self-pay

## 2024-11-01 ENCOUNTER — Other Ambulatory Visit: Payer: Self-pay

## 2024-11-01 MED ORDER — FREESTYLE LIBRE 3 PLUS SENSOR MISC
1.0000 | 3 refills | Status: AC
  Filled 2024-11-01: qty 6, 90d supply, fill #0

## 2024-11-03 ENCOUNTER — Other Ambulatory Visit (HOSPITAL_COMMUNITY): Payer: Self-pay

## 2024-11-03 MED ORDER — FREESTYLE LIBRE 3 SENSOR MISC
3 refills | Status: AC
  Filled 2024-11-03: qty 6, 84d supply, fill #0

## 2024-11-04 ENCOUNTER — Other Ambulatory Visit (HOSPITAL_COMMUNITY): Payer: Self-pay

## 2024-12-05 ENCOUNTER — Ambulatory Visit: Admitting: Orthopedic Surgery
